# Patient Record
Sex: Female | Born: 1935 | Race: White | Hispanic: No | Smoking: Former smoker
Health system: Southern US, Community
[De-identification: ages and names within clinical notes are randomized; demographics above are authoritative.]

## PROBLEM LIST (undated history)

## (undated) DIAGNOSIS — K219 Gastro-esophageal reflux disease without esophagitis: Secondary | ICD-10-CM

## (undated) DIAGNOSIS — Z87440 Personal history of urinary (tract) infections: Secondary | ICD-10-CM

## (undated) DIAGNOSIS — M1990 Unspecified osteoarthritis, unspecified site: Secondary | ICD-10-CM

## (undated) DIAGNOSIS — H409 Unspecified glaucoma: Secondary | ICD-10-CM

## (undated) DIAGNOSIS — E785 Hyperlipidemia, unspecified: Secondary | ICD-10-CM

## (undated) DIAGNOSIS — I10 Essential (primary) hypertension: Secondary | ICD-10-CM

## (undated) DIAGNOSIS — E039 Hypothyroidism, unspecified: Secondary | ICD-10-CM

## (undated) HISTORY — PX: THYROIDECTOMY: SHX17

## (undated) HISTORY — PX: OTHER SURGICAL HISTORY: SHX169

## (undated) HISTORY — PX: BREAST SURGERY: SHX581

## (undated) HISTORY — PX: ABDOMINAL HYSTERECTOMY: SHX81

## (undated) HISTORY — PX: BREAST LUMPECTOMY: SHX2

---

## 2001-10-11 ENCOUNTER — Encounter: Payer: Self-pay | Admitting: Family Medicine

## 2001-10-11 ENCOUNTER — Ambulatory Visit (HOSPITAL_COMMUNITY): Admission: RE | Admit: 2001-10-11 | Discharge: 2001-10-11 | Payer: Self-pay | Admitting: Family Medicine

## 2002-08-31 ENCOUNTER — Other Ambulatory Visit: Admission: RE | Admit: 2002-08-31 | Discharge: 2002-08-31 | Payer: Self-pay | Admitting: Dermatology

## 2002-10-19 ENCOUNTER — Encounter: Payer: Self-pay | Admitting: Family Medicine

## 2002-10-19 ENCOUNTER — Ambulatory Visit (HOSPITAL_COMMUNITY): Admission: RE | Admit: 2002-10-19 | Discharge: 2002-10-19 | Payer: Self-pay | Admitting: Family Medicine

## 2003-02-06 ENCOUNTER — Encounter: Payer: Self-pay | Admitting: Family Medicine

## 2003-02-06 ENCOUNTER — Ambulatory Visit (HOSPITAL_COMMUNITY): Admission: RE | Admit: 2003-02-06 | Discharge: 2003-02-06 | Payer: Self-pay | Admitting: Family Medicine

## 2003-02-14 ENCOUNTER — Observation Stay (HOSPITAL_COMMUNITY): Admission: RE | Admit: 2003-02-14 | Discharge: 2003-02-16 | Payer: Self-pay | Admitting: Internal Medicine

## 2004-05-27 ENCOUNTER — Ambulatory Visit (HOSPITAL_COMMUNITY): Admission: RE | Admit: 2004-05-27 | Discharge: 2004-05-27 | Payer: Self-pay | Admitting: Family Medicine

## 2005-09-28 ENCOUNTER — Ambulatory Visit (HOSPITAL_COMMUNITY): Admission: RE | Admit: 2005-09-28 | Discharge: 2005-09-28 | Payer: Self-pay | Admitting: Family Medicine

## 2005-10-28 ENCOUNTER — Ambulatory Visit (HOSPITAL_COMMUNITY): Admission: RE | Admit: 2005-10-28 | Discharge: 2005-10-28 | Payer: Self-pay | Admitting: Family Medicine

## 2006-06-29 ENCOUNTER — Ambulatory Visit (HOSPITAL_COMMUNITY): Admission: RE | Admit: 2006-06-29 | Discharge: 2006-06-29 | Payer: Self-pay | Admitting: Family Medicine

## 2006-07-30 ENCOUNTER — Ambulatory Visit (HOSPITAL_COMMUNITY): Admission: RE | Admit: 2006-07-30 | Discharge: 2006-07-30 | Payer: Self-pay | Admitting: Internal Medicine

## 2006-07-30 ENCOUNTER — Ambulatory Visit: Payer: Self-pay | Admitting: Internal Medicine

## 2006-11-23 ENCOUNTER — Ambulatory Visit (HOSPITAL_COMMUNITY): Admission: RE | Admit: 2006-11-23 | Discharge: 2006-11-23 | Payer: Self-pay | Admitting: Urology

## 2007-06-01 ENCOUNTER — Emergency Department (HOSPITAL_COMMUNITY): Admission: EM | Admit: 2007-06-01 | Discharge: 2007-06-02 | Payer: Self-pay | Admitting: Emergency Medicine

## 2008-01-19 ENCOUNTER — Ambulatory Visit (HOSPITAL_COMMUNITY): Admission: RE | Admit: 2008-01-19 | Discharge: 2008-01-19 | Payer: Self-pay | Admitting: Family Medicine

## 2008-04-19 ENCOUNTER — Ambulatory Visit (HOSPITAL_COMMUNITY): Admission: RE | Admit: 2008-04-19 | Discharge: 2008-04-19 | Payer: Self-pay | Admitting: Ophthalmology

## 2009-01-28 ENCOUNTER — Ambulatory Visit (HOSPITAL_COMMUNITY): Admission: RE | Admit: 2009-01-28 | Discharge: 2009-01-28 | Payer: Self-pay | Admitting: Family Medicine

## 2010-01-30 ENCOUNTER — Ambulatory Visit (HOSPITAL_COMMUNITY): Admission: RE | Admit: 2010-01-30 | Discharge: 2010-01-30 | Payer: Self-pay | Admitting: Family Medicine

## 2010-05-26 ENCOUNTER — Ambulatory Visit (HOSPITAL_COMMUNITY): Admission: RE | Admit: 2010-05-26 | Discharge: 2010-05-26 | Payer: Self-pay | Admitting: Family Medicine

## 2010-12-15 ENCOUNTER — Ambulatory Visit (HOSPITAL_COMMUNITY)
Admission: RE | Admit: 2010-12-15 | Discharge: 2010-12-15 | Payer: Self-pay | Source: Home / Self Care | Attending: Family Medicine | Admitting: Family Medicine

## 2011-01-03 ENCOUNTER — Encounter: Payer: Self-pay | Admitting: Family Medicine

## 2011-01-12 ENCOUNTER — Other Ambulatory Visit (HOSPITAL_COMMUNITY): Payer: Self-pay | Admitting: Family Medicine

## 2011-01-12 DIAGNOSIS — Z139 Encounter for screening, unspecified: Secondary | ICD-10-CM

## 2011-01-20 ENCOUNTER — Other Ambulatory Visit: Payer: Self-pay | Admitting: Ophthalmology

## 2011-01-20 ENCOUNTER — Encounter (HOSPITAL_COMMUNITY): Payer: Medicare Other | Attending: Ophthalmology

## 2011-01-20 DIAGNOSIS — Z01810 Encounter for preprocedural cardiovascular examination: Secondary | ICD-10-CM | POA: Insufficient documentation

## 2011-01-20 DIAGNOSIS — Z01812 Encounter for preprocedural laboratory examination: Secondary | ICD-10-CM | POA: Insufficient documentation

## 2011-01-20 LAB — BASIC METABOLIC PANEL
CO2: 28 mEq/L (ref 19–32)
Calcium: 9.5 mg/dL (ref 8.4–10.5)
Chloride: 103 mEq/L (ref 96–112)
Creatinine, Ser: 0.94 mg/dL (ref 0.4–1.2)
Glucose, Bld: 82 mg/dL (ref 70–99)

## 2011-01-26 ENCOUNTER — Ambulatory Visit (HOSPITAL_COMMUNITY)
Admission: RE | Admit: 2011-01-26 | Discharge: 2011-01-26 | Disposition: A | Discharge status: Home / Self Care | Payer: Medicare Other | Source: Ambulatory Visit | Attending: Ophthalmology | Admitting: Ophthalmology

## 2011-01-26 DIAGNOSIS — Z7982 Long term (current) use of aspirin: Secondary | ICD-10-CM | POA: Insufficient documentation

## 2011-01-26 DIAGNOSIS — J449 Chronic obstructive pulmonary disease, unspecified: Secondary | ICD-10-CM | POA: Insufficient documentation

## 2011-01-26 DIAGNOSIS — H2510 Age-related nuclear cataract, unspecified eye: Secondary | ICD-10-CM | POA: Insufficient documentation

## 2011-01-26 DIAGNOSIS — J4489 Other specified chronic obstructive pulmonary disease: Secondary | ICD-10-CM | POA: Insufficient documentation

## 2011-02-02 ENCOUNTER — Ambulatory Visit (HOSPITAL_COMMUNITY): Payer: Medicare Other

## 2011-02-06 ENCOUNTER — Ambulatory Visit (HOSPITAL_COMMUNITY)
Admission: RE | Admit: 2011-02-06 | Discharge: 2011-02-06 | Disposition: A | Discharge status: Home / Self Care | Payer: Medicare Other | Source: Ambulatory Visit | Attending: Family Medicine | Admitting: Family Medicine

## 2011-02-06 DIAGNOSIS — Z139 Encounter for screening, unspecified: Secondary | ICD-10-CM

## 2011-02-06 DIAGNOSIS — Z1231 Encounter for screening mammogram for malignant neoplasm of breast: Secondary | ICD-10-CM | POA: Insufficient documentation

## 2011-05-01 NOTE — Op Note (Signed)
NAMEANDRIA, HEAD             ACCOUNT NO.:  [id number]   MEDICAL RECORD NO.:  [id number]          PATIENT TYPE:  AMB   LOCATION:  DAY                           FACILITY:  [facility]   PHYSICIAN:  Lionel December, M.D.    DATE OF BIRTH:  Nov 17, 1929   DATE OF PROCEDURE:  07/30/2006  DATE OF DISCHARGE:                                 OPERATIVE REPORT   PROCEDURE:  Colonoscopy.   INDICATIONS FOR PROCEDURE:  Rwanda is a 77 year old Caucasian female who  is undergoing screening colonoscopy.  Family history is negative for CRC in  first-degree relatives, but she has two nephews who were treated for colon  carcinoma in their 56s.  This is her first examination.  The procedure risks  were reviewed with the patient, and informed consent was obtained.   PREOPERATIVE MEDICATIONS:  Conscious sedation with Demerol 50 mg IV, Versed  5 mg IV.   FINDINGS:  The procedure was performed in the endoscopy suite.  The  patient's vital signs and O2 saturations were monitored during the procedure  and remained stable.  The patient was placed in the left lateral recumbent  position and rectal examination performed.  No abnormality noted on external  or digital exam.  The Olympus videoscope was placed into the rectum and  advanced into the region of the sigmoid colon and beyond.  She had multiple  diverticula at the sigmoid colon.  Some formed stool was flushed out of the  way.  The scope was passed into the transverse colon which was somewhat  redundant, but using abdominal pressure and different positions, the scope  was passed into the cecum which was identified by the ileocecal valve and  appendiceal stump.  Pictures were taken for the record.  As the scope was  withdrawn, the colonic mucosa was examined for the second time, and there  were no polyps or masses noted.  The rectal mucosa similarly was normal.  The scope was retroflexed to examine the anorectal junction, and small  hemorrhoids were noted  below the dentate line along with anal papilla.  The  endoscope was straightened and withdrawn.  The patient tolerated the  procedure well.   FINAL DIAGNOSES:  1. Pancolonic diverticulosis.  Most of the diverticula are limited to the      sigmoid colon.  2. External hemorrhoids with a single anal papilla.   RECOMMENDATIONS:  1. Yearly Hemoccults.  2. She may consider her next screening exam in 10 years from now.  3. High fiber diet.      Lionel December, M.D.  Electronically Signed     NR/MEDQ  D:  07/30/2006  T:  07/30/2006  Job:  [id number]   cc:   Corrie Mckusick, M.D.  Fax: [phone]

## 2011-05-01 NOTE — H&P (Signed)
NAME:  Kelsey Ruiz, Kelsey Ruiz                       ACCOUNT NO.:  [id number]   MEDICAL RECORD NO.:  [id number]                   PATIENT TYPE:  AMB   LOCATION:  DAY                                  FACILITY:  [facility]   PHYSICIAN:  Bernerd Limbo. Leona Carry, M.D.             DATE OF BIRTH:  [birth year]   DATE OF ADMISSION:  02/13/2003  DATE OF DISCHARGE:                                HISTORY & PHYSICAL   REASON FOR ADMISSION:  This 77 year old white female is admitted to this  hospital with a mass in the right side of the neck.   HISTORY OF PRESENT ILLNESS:  The patient has been in good health.  She has  been followed by Dr. Phillips Odor.  Recently he noticed a mass in the right side  of the neck, and he ordered an ultrasound.  The ultrasound revealed a right  lobe mass of the thyroid.  It was the opinion of the radiologist this could  be a degenerated adenoma versus cancer. The patient is admitted now for  removal of this mass of the right side of the neck.  She has had no  symptoms, no difficulty in swallowing.  Recent TSH was normal.  She is  euthyroid.  She has been in good health.   PAST MEDICAL HISTORY:  1. In 1996, she had a vaginal hysterectomy and anterior repair.  2. 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   CURRENT MEDICATIONS:  Aspirin and Lipitor.   ALLERGIES:  No history of any food or drug allergy.   PHYSICAL EXAMINATION:  GENERAL:  Healthy 77 year old white female in no  acute distress.  HEENT:  Negative.  NECK:  Supple.  There is a well-healed thyroidectomy scar on the right side  of the neck in the region of the right lobe of the thyroid.  There is a mass  that measures probably 4 cm x 3 cm.  There is no pain.  It does not appear  to be fixed.  There is no palpable adenopathy.  The left side is normal.  No  supraclavicular nodes are noted.  RESPIRATORY:  Clear to percussion and auscultation.  BREASTS:   Moderate size and symmetrical.  No abnormal masses.  Examination  of both axillae is normal.  ABDOMEN:  Soft.  No areas of muscle guarding or tenderness.  No mass or  hepatosplenomegaly.  Normal peristalsis.  MUSCULOSKELETAL:  Limbs and back negative.   ADMISSION DIAGNOSIS:  Recurrent mass of the right lobe of the thyroid,  adenoma versus cancer.   DISPOSITION:  The patient is admitted for a right thyroid lobectomy.  The  surgery, risks, complications, and possible consequences have been discussed  with the patient.  She agrees with the surgery.  She has been scheduled for  February 14, 2003.  Bernerd Limbo. Leona Carry, M.D.   NMD/MEDQ  D:  02/13/2003  T:  02/13/2003  Job:  [id number]

## 2011-05-01 NOTE — Procedures (Signed)
NAME:  AMARYA, KUEHL                       ACCOUNT NO.:  [id number]   MEDICAL RECORD NO.:  [id number]                   PATIENT TYPE:  OUT   LOCATION:  RAD                                  FACILITY:  [facility]   PHYSICIAN:  Willa Rough, M.D.                  DATE OF BIRTH:  [birth year]   DATE OF PROCEDURE:  DATE OF DISCHARGE:                                  ECHOCARDIOGRAM   MEDICAL NECESSITY NUMBERS:  780.2 and 435.9.   The patient has had a question of a TIA and question of syncope and this  study is done for further evaluation.   2-D ECHOCARDIOGRAM:  Aorta:  33 mm.  Aortic valve:  Normal.  Left atrium:  36 mm.  Mitral valve:  Normal.  Left ventricle:  End-diastolic dimension 38  mm and systolic dimension 26 mm.  Wall thickness is 12 mm, compatible with  left ventricular hypertrophy.  Overall wall motion is good.  The ejection  fraction is in the 60% range.  I cannot rule out the possibility of a focal  wall motion abnormality effecting the posterior wall.  Right ventricle:  Normal.  Tricuspid valve:  Normal.  Pulmonic valve:  Not well seen.  Pericardial effusion:  There is no significant effusion seen.   Doppler analysis reveals no significant abnormalities.   IMPRESSION:  1. Good left ventricular function.  2. Question of mild focal wall motion abnormality effecting the posterior     wall.  3. Mild left ventricular hypertrophy.  4. There is no obvious cardiac source of embolus seen.      ___________________________________________                                            Willa Rough, M.D.   JK/MEDQ  D:  05/28/2004  T:  05/28/2004  Job:  [id number]   cc:   Patrica Duel, M.D.  [address]  [place]  [place] [address]  Fax: [phone]   Corrie Mckusick, M.D.  [address]  [place]  [place] [address]  Fax: [phone]

## 2011-05-01 NOTE — Procedures (Signed)
   NAME:  Kelsey Ruiz, Kelsey Ruiz                       ACCOUNT NO.:  [id number]   MEDICAL RECORD NO.:  [id number]                   PATIENT TYPE:  AMB   LOCATION:                                       FACILITY:   PHYSICIAN:  Vida Roller, M.D.                DATE OF BIRTH:  1929-01-08   DATE OF PROCEDURE:  02/12/2003  DATE OF DISCHARGE:                                EKG INTERPRETATION   Electrocardiogram was obtained on 02/12/03.   RESULTS:  Normal sinus rhythm at a rate of 75 with normal intervals and  normal axes.  There is no evidence of myocardial infarction.  There is no  evidence of an old myocardial infarction and there is no evidence of  ischemia.  Overall, electrocardiogram is normal.                                               Vida Roller, M.D.    JH/MEDQ  D:  02/12/2003  T:  02/12/2003  Job:  [id number]

## 2011-05-01 NOTE — Op Note (Signed)
NAME:  Kelsey Ruiz, Kelsey Ruiz                       ACCOUNT NO.:  [id number]   MEDICAL RECORD NO.:  [id number]                   PATIENT TYPE:  AMB   LOCATION:  DAY                                  FACILITY:  [facility]   PHYSICIAN:  Bernerd Limbo. Leona Carry, M.D.             DATE OF BIRTH:  [birth year]   DATE OF PROCEDURE:  02/14/2003  DATE OF DISCHARGE:                                 OPERATIVE REPORT   PREOPERATIVE DIAGNOSIS:  Mass of the right lobe of the thyroid.   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   PROCEDURE:  Right thyroid lobectomy.   COMPLICATIONS:  None.   SURGEON:  Bernerd Limbo. Destefano, M.D.   DESCRIPTION OF PROCEDURE:  Under adequate general anesthesia, patient  prepped and draped in the usual manner, and a low collar-type incision was  made through the previous scar.  This was carried through the subcutaneous  tissues, and flaps were developed above the thyroid cartilage and below the  sternal notch.  There was a markedly enlarged right lobe of the thyroid.  There were marked adhesions in this region secondary to her prior subtotal  thyroidectomy on the right.  The gland was mobilized by sharp and blunt  dissection.  Bleeders were clipped with a Ligaclip.  The lower portion of  the gland was freed up first and then utilizing mostly blunt dissection, the  muscle layers on the right were freed from the gland itself.  There was a  marked amount of adhesions in this area.  The medial portion of the gland  was mobilized and most of the gland introduced in the wound.  The superior  pole vessels were doubly clipped and transected and then utilizing more or  less of a blunt technique pushing tissue inferiorly out of the way, we were  able to mobilize the entire gland and peel it off the lateral side of the  trachea.  The gland was removed in toto.  I identified the parathyroid  glands, I think that probably the lower portion of the specimen did have  the  inferior parathyroid glands.  I definitely localized the recurrent laryngeal  nerve.  I was concerned that it may be in some of the scar tissue.  Bleeding  during the course of the procedure was minimal.  The specimen was submitted  for frozen section, and a diagnosis of cystic adenomatous goiter on the  right was made.  After treating all bleeders under control, a small piece of  Gelfoam wrapped in Surgicel was packed into the right thyroid fossa.  The  strap muscles were reapproximated with interrupted 2-0 silk.  Prior to doing  this, a small Hemovac drain was placed over the top of the previously-placed  Gelfoam and Surgicel and brought out through a stab wound on the left side  of the neck.  After closure of the strap muscles, the subcutaneous tissue  was closed with continuous 3-0 Vicryl and the skin was then closed with a  subcuticular suture of 4-0 Vicryl, Steri-Strips, 4 x 4 sponges, and a towel  were then applied.  Blood loss was probably 10 or 15 mL.  The patient  tolerated the procedure nicely and left in good condition.                                               Bernerd Limbo. Leona Carry, M.D.    NMD/MEDQ  D:  02/14/2003  T:  02/14/2003  Job:  [id number]

## 2011-09-08 LAB — BASIC METABOLIC PANEL
CO2: 29
Calcium: 8.9
Sodium: 139

## 2011-09-30 LAB — URINALYSIS, ROUTINE W REFLEX MICROSCOPIC
Bilirubin Urine: NEGATIVE
Glucose, UA: NEGATIVE
Leukocytes, UA: NEGATIVE
Specific Gravity, Urine: 1.02
Urobilinogen, UA: 0.2

## 2011-09-30 LAB — COMPREHENSIVE METABOLIC PANEL
Albumin: 4
BUN: 13
Chloride: 104
Creatinine, Ser: 0.68
GFR calc Af Amer: >60
GFR calc non Af Amer: >60
Glucose, Bld: 128 — ABNORMAL HIGH
Potassium: 3.9
Total Bilirubin: 0.7

## 2011-09-30 LAB — DIFFERENTIAL
Basophils Absolute: 0.2 — ABNORMAL HIGH
Eosinophils Absolute: 0.1
Eosinophils Relative: 1
Lymphocytes Relative: 8 — ABNORMAL LOW
Lymphs Abs: 1.4
Monocytes Absolute: 1.5 — ABNORMAL HIGH

## 2011-09-30 LAB — CBC
HCT: 36.1
MCV: 86.5
RBC: 4.18
WBC: 17.8 — ABNORMAL HIGH

## 2011-09-30 LAB — URINE MICROSCOPIC-ADD ON

## 2011-09-30 LAB — CULTURE, BLOOD (ROUTINE X 2): Report Status: 6242008

## 2011-11-28 ENCOUNTER — Other Ambulatory Visit (HOSPITAL_COMMUNITY): Payer: Self-pay | Admitting: Family Medicine

## 2011-11-28 ENCOUNTER — Ambulatory Visit (HOSPITAL_COMMUNITY)
Admission: RE | Admit: 2011-11-28 | Discharge: 2011-11-28 | Disposition: A | Discharge status: Home / Self Care | Payer: Medicare Other | Source: Ambulatory Visit | Attending: Family Medicine | Admitting: Family Medicine

## 2011-11-28 DIAGNOSIS — M545 Low back pain, unspecified: Secondary | ICD-10-CM | POA: Insufficient documentation

## 2011-11-28 DIAGNOSIS — M549 Dorsalgia, unspecified: Secondary | ICD-10-CM

## 2011-11-28 DIAGNOSIS — M25559 Pain in unspecified hip: Secondary | ICD-10-CM

## 2011-11-30 ENCOUNTER — Other Ambulatory Visit (HOSPITAL_COMMUNITY): Payer: Self-pay | Admitting: Family Medicine

## 2011-11-30 DIAGNOSIS — Z139 Encounter for screening, unspecified: Secondary | ICD-10-CM

## 2011-11-30 DIAGNOSIS — M545 Low back pain: Secondary | ICD-10-CM

## 2011-12-03 ENCOUNTER — Ambulatory Visit (HOSPITAL_COMMUNITY)
Admission: RE | Admit: 2011-12-03 | Discharge: 2011-12-03 | Disposition: A | Discharge status: Home / Self Care | Payer: Medicare Other | Source: Ambulatory Visit | Attending: Family Medicine | Admitting: Family Medicine

## 2011-12-03 DIAGNOSIS — Z780 Asymptomatic menopausal state: Secondary | ICD-10-CM | POA: Insufficient documentation

## 2011-12-03 DIAGNOSIS — M899 Disorder of bone, unspecified: Secondary | ICD-10-CM | POA: Insufficient documentation

## 2011-12-03 DIAGNOSIS — Z139 Encounter for screening, unspecified: Secondary | ICD-10-CM

## 2011-12-03 DIAGNOSIS — M545 Low back pain: Secondary | ICD-10-CM

## 2012-01-25 ENCOUNTER — Other Ambulatory Visit (HOSPITAL_COMMUNITY): Payer: Self-pay | Admitting: Family Medicine

## 2012-01-25 DIAGNOSIS — Z139 Encounter for screening, unspecified: Secondary | ICD-10-CM

## 2012-02-08 ENCOUNTER — Ambulatory Visit (HOSPITAL_COMMUNITY)
Admission: RE | Admit: 2012-02-08 | Discharge: 2012-02-08 | Disposition: A | Discharge status: Home / Self Care | Payer: Medicare Other | Source: Ambulatory Visit | Attending: Family Medicine | Admitting: Family Medicine

## 2012-02-08 DIAGNOSIS — Z1231 Encounter for screening mammogram for malignant neoplasm of breast: Secondary | ICD-10-CM | POA: Insufficient documentation

## 2012-02-08 DIAGNOSIS — Z139 Encounter for screening, unspecified: Secondary | ICD-10-CM

## 2012-06-13 ENCOUNTER — Other Ambulatory Visit (HOSPITAL_COMMUNITY): Payer: Self-pay | Admitting: Family Medicine

## 2012-06-13 ENCOUNTER — Ambulatory Visit (HOSPITAL_COMMUNITY)
Admission: RE | Admit: 2012-06-13 | Discharge: 2012-06-13 | Disposition: A | Discharge status: Home / Self Care | Payer: Medicare Other | Source: Ambulatory Visit | Attending: Family Medicine | Admitting: Family Medicine

## 2012-06-13 DIAGNOSIS — M25569 Pain in unspecified knee: Secondary | ICD-10-CM

## 2012-06-13 DIAGNOSIS — M25469 Effusion, unspecified knee: Secondary | ICD-10-CM | POA: Insufficient documentation

## 2013-01-25 ENCOUNTER — Other Ambulatory Visit (HOSPITAL_COMMUNITY): Payer: Self-pay | Admitting: Family Medicine

## 2013-01-25 DIAGNOSIS — Z139 Encounter for screening, unspecified: Secondary | ICD-10-CM

## 2013-02-09 ENCOUNTER — Ambulatory Visit (HOSPITAL_COMMUNITY)
Admission: RE | Admit: 2013-02-09 | Discharge: 2013-02-09 | Disposition: A | Discharge status: Home / Self Care | Payer: Medicare Other | Source: Ambulatory Visit | Attending: Family Medicine | Admitting: Family Medicine

## 2013-02-09 DIAGNOSIS — Z1231 Encounter for screening mammogram for malignant neoplasm of breast: Secondary | ICD-10-CM | POA: Insufficient documentation

## 2013-02-09 DIAGNOSIS — Z139 Encounter for screening, unspecified: Secondary | ICD-10-CM

## 2013-04-11 ENCOUNTER — Other Ambulatory Visit (HOSPITAL_COMMUNITY): Payer: Self-pay | Admitting: Family Medicine

## 2013-04-11 ENCOUNTER — Ambulatory Visit (HOSPITAL_COMMUNITY)
Admission: RE | Admit: 2013-04-11 | Discharge: 2013-04-11 | Disposition: A | Discharge status: Home / Self Care | Payer: Medicare Other | Source: Ambulatory Visit | Attending: Family Medicine | Admitting: Family Medicine

## 2013-04-11 DIAGNOSIS — R51 Headache: Secondary | ICD-10-CM

## 2013-04-11 DIAGNOSIS — H532 Diplopia: Secondary | ICD-10-CM | POA: Insufficient documentation

## 2013-04-11 DIAGNOSIS — Z139 Encounter for screening, unspecified: Secondary | ICD-10-CM

## 2013-04-13 ENCOUNTER — Ambulatory Visit (HOSPITAL_COMMUNITY): Payer: Medicare Other

## 2013-04-17 ENCOUNTER — Ambulatory Visit (HOSPITAL_COMMUNITY): Admission: RE | Admit: 2013-04-17 | Payer: Medicare Other | Source: Ambulatory Visit

## 2013-04-19 ENCOUNTER — Ambulatory Visit (HOSPITAL_COMMUNITY): Payer: Medicare Other

## 2014-02-12 ENCOUNTER — Ambulatory Visit (HOSPITAL_COMMUNITY)
Admission: RE | Admit: 2014-02-12 | Discharge: 2014-02-12 | Disposition: A | Discharge status: Home / Self Care | Payer: Medicare Other | Source: Ambulatory Visit | Attending: Family Medicine | Admitting: Family Medicine

## 2014-02-12 DIAGNOSIS — Z139 Encounter for screening, unspecified: Secondary | ICD-10-CM

## 2014-02-12 DIAGNOSIS — Z1231 Encounter for screening mammogram for malignant neoplasm of breast: Secondary | ICD-10-CM | POA: Insufficient documentation

## 2014-02-14 ENCOUNTER — Other Ambulatory Visit: Payer: Self-pay | Admitting: Family Medicine

## 2014-02-14 DIAGNOSIS — R928 Other abnormal and inconclusive findings on diagnostic imaging of breast: Secondary | ICD-10-CM

## 2014-02-28 ENCOUNTER — Ambulatory Visit (HOSPITAL_COMMUNITY)
Admission: RE | Admit: 2014-02-28 | Discharge: 2014-02-28 | Disposition: A | Discharge status: Home / Self Care | Payer: Medicare Other | Source: Ambulatory Visit | Attending: Family Medicine | Admitting: Family Medicine

## 2014-02-28 ENCOUNTER — Other Ambulatory Visit: Payer: Self-pay | Admitting: Family Medicine

## 2014-02-28 DIAGNOSIS — R928 Other abnormal and inconclusive findings on diagnostic imaging of breast: Secondary | ICD-10-CM

## 2014-02-28 DIAGNOSIS — N63 Unspecified lump in unspecified breast: Secondary | ICD-10-CM | POA: Insufficient documentation

## 2014-03-06 ENCOUNTER — Other Ambulatory Visit (HOSPITAL_COMMUNITY): Payer: Self-pay | Admitting: Family Medicine

## 2014-03-06 DIAGNOSIS — R928 Other abnormal and inconclusive findings on diagnostic imaging of breast: Secondary | ICD-10-CM

## 2014-03-07 ENCOUNTER — Other Ambulatory Visit (HOSPITAL_COMMUNITY): Payer: Self-pay | Admitting: Family Medicine

## 2014-03-07 ENCOUNTER — Encounter (HOSPITAL_COMMUNITY): Payer: Self-pay

## 2014-03-07 ENCOUNTER — Ambulatory Visit (HOSPITAL_COMMUNITY)
Admission: RE | Admit: 2014-03-07 | Discharge: 2014-03-07 | Disposition: A | Discharge status: Home / Self Care | Payer: Medicare Other | Source: Ambulatory Visit | Attending: Family Medicine | Admitting: Family Medicine

## 2014-03-07 DIAGNOSIS — R928 Other abnormal and inconclusive findings on diagnostic imaging of breast: Secondary | ICD-10-CM

## 2014-03-07 DIAGNOSIS — R229 Localized swelling, mass and lump, unspecified: Principal | ICD-10-CM

## 2014-03-07 DIAGNOSIS — IMO0002 Reserved for concepts with insufficient information to code with codable children: Secondary | ICD-10-CM

## 2014-03-07 DIAGNOSIS — N63 Unspecified lump in unspecified breast: Secondary | ICD-10-CM | POA: Insufficient documentation

## 2014-03-07 DIAGNOSIS — N649 Disorder of breast, unspecified: Secondary | ICD-10-CM | POA: Insufficient documentation

## 2014-03-07 HISTORY — DX: Essential (primary) hypertension: I10

## 2014-03-07 MED ORDER — LIDOCAINE HCL (PF) 2 % IJ SOLN
INTRAMUSCULAR | Status: AC
  Administered 2014-03-07: 10 mL via INTRADERMAL
  Filled 2014-03-07: qty 10

## 2014-03-07 MED ORDER — LIDOCAINE HCL (PF) 2 % IJ SOLN
10.0000 mL | Freq: Once | INTRAMUSCULAR | Status: AC
  Administered 2014-03-07: 10 mL via INTRADERMAL

## 2014-03-07 NOTE — Progress Notes (Signed)
Pt tolerating procedure well.

## 2014-03-07 NOTE — Progress Notes (Signed)
Pt states she took ASA this morning, MD aware, advised about additional bruising after procedure.

## 2014-03-07 NOTE — Discharge Instructions (Addendum)
Breast Biopsy °A breast biopsy is a procedure where a sample of breast tissue is removed from your breast. The tissue is examined under a microscope to see if cancerous cells are present. A breast biopsy is done when there is: °· Any undiagnosed breast mass (tumor). °· Nipple abnormalities, dimpling, crusting, or ulcerations. °· Abnormal discharge from the nipple, especially blood. °· Redness, swelling, and pain of the breast. °· Calcium deposits (calcifications) or abnormalities seen on a mammogram, ultrasound result, or results of magnetic resonance imaging (MRI). °· Suspicious changes in the breast seen on your mammogram. °If the tumor is found to be cancerous (malignant), a breast biopsy can help to determine what the best treatment is for you. There are many different types of breast biopsies. Talk to your caregiver about your options and which type is best for you. °LET YOUR CAREGIVER KNOW ABOUT: °· Allergies to food or medicine. °· Medicines taken, including vitamins, herbs, eyedrops, over-the-counter medicines, and creams. °· Use of steroids (by mouth or creams). °· Previous problems with anesthetics or numbing medicines. °· History of bleeding problems or blood clots. °· Previous surgery. °· Other health problems, including diabetes and kidney problems. °· Any recent colds or infections. °· Possibility of pregnancy, if this applies. °RISKS AND COMPLICATIONS  °· Bleeding. °· Infection. °· Allergy to medicines. °· Bruising and swelling of the breast. °· Alteration in the shape of the breast. °· Not finding the lump or abnormality. °· Needing more surgery. °BEFORE THE PROCEDURE °· Arrange for someone to drive you home after the procedure. °· Do not smoke for 2 weeks before the procedure. Stop smoking, if you smoke. °· Do not drink alcohol for 24 hours before procedure. °· Wear a good support bra to the procedure. °PROCEDURE  °You may be given a medicine to numb the breast area (local anesthesia) or a medicine  to make you sleep (general anesthesia) during the procedure. The following are the different types of biopsies that can be performed.  °· Fine-needle aspiration A thin needle is attached to a syringe and inserted into the breast lump. Fluid and cells are removed and then looked at under a microscope. If the breast lump cannot be felt, an ultrasound may be used to help locate the lump and place the needle in the correct area.   °· Core needle biopsy A wide, hollow needle (core needle) is inserted into the breast lump 3 6 times to get tissue samples or cores. The samples are removed. The needle is usually placed in the correct area by using an ultrasound or X-ray.   °· Stereotactic biopsy X-ray equipment and a computer are used to analyze X-ray pictures of the breast lump. The computer then finds exactly where the core needle needs to be inserted. Tissue samples are removed.   °· Vacuum-assisted biopsy A small incision (less than ¼ inch) is made in your breast. A biopsy device that includes a hollow needle and vacuum is passed through the incision and into the breast tissue. The vacuum gently draws abnormal breast tissue into the needle to remove it. This type of biopsy removes a larger tissue sample than a regular core needle biopsy. No stitches are needed, and there is usually little scarring. °· Ultrasound-guided core needle biopsy A high frequency ultrasound helps guide the core needle to the area of the mass or abnormality. An incision is made to insert the needle. Tissue samples are removed. °· Open biopsy A larger incision is made in the breast. Your caregiver will attempt   to remove the whole breast lump or as much as possible. °AFTER THE PROCEDURE °· You will be taken to the recovery area. If you are doing well and have no problems, you will be allowed to go home. °· You may notice bruising on your breast. This is normal. °· Your caregiver may apply a pressure dressing on your breast for 24 48 hours. A  pressure dressing is a bandage that is wrapped tightly around the chest to stop fluid from collecting underneath tissues. °Document Released: 11/30/2005 Document Revised: 03/27/2013 Document Reviewed: 12/31/2011 °[employer]® Patient Information ©2014 [employer]. °Breast Biopsy °Care After °These instructions give you information on caring for yourself after your procedure. Your doctor may also give you more specific instructions. Call your doctor if you have any problems or questions after your procedure. °HOME CARE °· Only take medicine as told by your doctor. °· Do not take aspirin. °· Keep your sutures (stitches) dry when bathing. °· Protect the biopsy area. Do not let the area get bumped. °· Avoid activities that could pull the biopsy site open until your doctor approves. This includes: °· Stretching. °· Reaching. °· Exercise. °· Sports. °· Lifting more than 3lb. °· Continue your normal diet. °· Wear a good support bra for as long as told by your doctor. °· Change any bandages (dressings) as told by your doctor. °· Do not drink alcohol while taking pain medicine. °· Keep all doctor visits as told. Ask when your test results will be ready. Make sure you get your test results. °GET HELP RIGHT AWAY IF:  °· You have a fever. °· You have more bleeding (more than a small spot) from the biopsy site. °· You have trouble breathing. °· You have yellowish-white fluid (pus) coming from the biopsy site. °· You have redness, puffiness (swelling), or more pain in the biopsy site. °· You have a bad smell coming from the biopsy site. °· Your biopsy site opens after sutures, staples, or sticky strips have been removed. °· You have a rash. °· You need stronger medicine. °MAKE SURE YOU: °· Understand these instructions. °· Will watch your condition. °· Will get help right away if you are not doing well or get worse. °Document Released: 09/26/2009 Document Revised: 02/22/2012 Document Reviewed: 01/10/2012 °[employer]® Patient  Information ©2014 [employer]. ° °

## 2014-03-07 NOTE — Progress Notes (Signed)
Procedure completed, pt tolerated well, samples collected by MD and Ultrasound technician and sent to lab.

## 2014-03-29 ENCOUNTER — Other Ambulatory Visit (HOSPITAL_COMMUNITY): Payer: Self-pay | Admitting: General Surgery

## 2014-03-29 DIAGNOSIS — D493 Neoplasm of unspecified behavior of breast: Secondary | ICD-10-CM

## 2014-03-29 NOTE — H&P (Signed)
  NTS SOAP Note  Vital Signs:  Vitals as of: 2/59/5638: Systolic 756: Diastolic 97: Heart Rate 81: Temp 54F: Height 55ft 2.5in: Weight 166Lbs 0 Ounces: BMI 29.88  BMI : 29.88 kg/m2  Subjective: This 65 Years 41 Months old Female presents for of an abnormal right mammogram.  Suspicious area found on routine mammography.  Biopsy showed atypical cells.  Referred for formal excision.  No family h/o breast cancer.  No nipple discharge noted.  No palpable lump.  Review of Symptoms:  Constitutional:unremarkable   Head:unremarkable    Eyes:unremarkable   Nose/Mouth/Throat:unremarkable Cardiovascular:  unremarkable   Respiratory:unremarkable   Gastrointestin    heartburn Genitourinary:unremarkable       joint pain Skin:unremarkable   as above Hematolgic/Lymphatic:unremarkable       hay fever   Past Medical History:    Reviewed  Past Medical History  Surgical History: thyroidectomy Medical Problems: high cholesterol, chronic back pain Allergies: nkda Medications: lipitor, levothyroxine, asa, fosamax, hydrocodone   Social History:Reviewed  Social History  Preferred Language: English Race:  White Ethnicity: Not Hispanic / Latino Age: 80 Years 7 Months Marital Status:  M Alcohol: no Recreational drug(s): no   Smoking Status: Never smoker reviewed on 03/29/2014 Functional Status reviewed on 03/29/2014 ------------------------------------------------ Bathing: Normal Cooking: Normal Dressing: Normal Driving: Normal Eating: Normal Managing Meds: Normal Oral Care: Normal Shopping: Normal Toileting: Normal Transferring: Normal Walking: Normal Cognitive Status reviewed on 03/29/2014 ------------------------------------------------ Attention: Normal Decision Making: Normal Language: Normal Memory: Normal Motor: Normal Perception: Normal Problem Solving: Normal Visual and Spatial: Normal   Family History:   [place]as History Mother, Deceased; Healthy;  Father, Deceased; Healthy;     Objective Information: General:  Well appearing, well nourished in no distress. Neck:  Supple without lymphadenopathy.  Heart:  RRR, no murmur or gallop.  Normal S1, S2.  No S3, S4.  Lungs:    CTA bilaterally, no wheezes, rhonchi, rales.  Breathing unlabored. Breasts:  unremarkable  No dominant mass, nipple discharge, dimpling.  Axillas negative for palpable nodes.    Assessment:Neoplasm, right breast  Diagnoses: 433.2 Neoplasm of uncertain behavior of breast (Neoplasm of uncertain behavior of right breast)  Procedures: 95188 - OFFICE OUTPATIENT NEW 30 MINUTES    Plan:  Scheduled for right breast biopsy after needle localization on 04/11/14.   Patient Education:Alternative treatments to surgery were discussed with patient (and family).  Risks and benefits  of procedure were fully explained to the patient (and family) who gave informed consent. Patient/family questions were addressed.  will stop ASA.  Follow-up:Pending Surgery

## 2014-03-30 ENCOUNTER — Encounter (HOSPITAL_COMMUNITY): Payer: Self-pay | Admitting: Pharmacy Technician

## 2014-04-04 NOTE — Patient Instructions (Signed)
Kelsey Ruiz  04/04/2014   Your procedure is scheduled on:   04/11/2014  Report to [facility] at  24  AM.  Call this number if you have problems the morning of surgery: [phone]   Remember:   Do not eat food or drink liquids after midnight.   Take these medicines the morning of surgery with A SIP OF WATER: hydrocodone, levothyroxine, prilosec   Do not wear jewelry, make-up or nail polish.  Do not wear lotions, powders, or perfumes.   Do not shave 48 hours prior to surgery. Men may shave face and neck.  Do not bring valuables to the hospital.  [facility] is not responsible for any belongings or valuables.               Contacts, dentures or bridgework may not be worn into surgery.  Leave suitcase in the car. After surgery it may be brought to your room.  For patients admitted to the hospital, discharge time is determined by your treatment team.               Patients discharged the day of surgery will not be allowed to drive home.  Name and phone number of your driver: family  Special Instructions: Shower using CHG 2 nights before surgery and the night before surgery.  If you shower the day of surgery use CHG.  Use special wash - you have one bottle of CHG for all showers.  You should use approximately 1/3 of the bottle for each shower.   Please read over the following fact sheets that you were given: Pain Booklet, Coughing and Deep Breathing, Surgical Site Infection Prevention, Anesthesia Post-op Instructions and Care and Recovery After Surgery Breast Biopsy A breast biopsy is a procedure where a sample of breast tissue is removed from your breast. The tissue is examined under a microscope to see if cancerous cells are present. A breast biopsy is done when there is:  Any undiagnosed breast mass (tumor).  Nipple abnormalities, dimpling, crusting, or ulcerations.  Abnormal discharge from the nipple, especially blood.  Redness, swelling, and pain of the  breast.  Calcium deposits (calcifications) or abnormalities seen on a mammogram, ultrasound result, or results of magnetic resonance imaging (MRI).  Suspicious changes in the breast seen on your mammogram. If the tumor is found to be cancerous (malignant), a breast biopsy can help to determine what the best treatment is for you. There are many different types of breast biopsies. Talk to your caregiver about your options and which type is best for you. LET YOUR CAREGIVER KNOW ABOUT:  Allergies to food or medicine.  Medicines taken, including vitamins, herbs, eyedrops, over-the-counter medicines, and creams.  Use of steroids (by mouth or creams).  Previous problems with anesthetics or numbing medicines.  History of bleeding problems or blood clots.  Previous surgery.  Other health problems, including diabetes and kidney problems.  Any recent colds or infections.  Possibility of pregnancy, if this applies. RISKS AND COMPLICATIONS   Bleeding.  Infection.  Allergy to medicines.  Bruising and swelling of the breast.  Alteration in the shape of the breast.  Not finding the lump or abnormality.  Needing more surgery. BEFORE THE PROCEDURE  Arrange for someone to drive you home after the procedure.  Do not smoke for 2 weeks before the procedure. Stop smoking, if you smoke.  Do not drink alcohol for 24 hours before procedure.  Wear a good support bra to the  procedure. PROCEDURE  You may be given a medicine to numb the breast area (local anesthesia) or a medicine to make you sleep (general anesthesia) during the procedure. The following are the different types of biopsies that can be performed.   Fine-needle aspiration A thin needle is attached to a syringe and inserted into the breast lump. Fluid and cells are removed and then looked at under a microscope. If the breast lump cannot be felt, an ultrasound may be used to help locate the lump and place the needle in the correct  area.   Core needle biopsy A wide, hollow needle (core needle) is inserted into the breast lump 3 6 times to get tissue samples or cores. The samples are removed. The needle is usually placed in the correct area by using an ultrasound or X-ray.   Stereotactic biopsy X-ray equipment and a computer are used to analyze X-ray pictures of the breast lump. The computer then finds exactly where the core needle needs to be inserted. Tissue samples are removed.   Vacuum-assisted biopsy A small incision (less than  inch) is made in your breast. A biopsy device that includes a hollow needle and vacuum is passed through the incision and into the breast tissue. The vacuum gently draws abnormal breast tissue into the needle to remove it. This type of biopsy removes a larger tissue sample than a regular core needle biopsy. No stitches are needed, and there is usually little scarring.  Ultrasound-guided core needle biopsy A high frequency ultrasound helps guide the core needle to the area of the mass or abnormality. An incision is made to insert the needle. Tissue samples are removed.  Open biopsy A larger incision is made in the breast. Your caregiver will attempt to remove the whole breast lump or as much as possible. AFTER THE PROCEDURE  You will be taken to the recovery area. If you are doing well and have no problems, you will be allowed to go home.  You may notice bruising on your breast. This is normal.  Your caregiver may apply a pressure dressing on your breast for 24 48 hours. A pressure dressing is a bandage that is wrapped tightly around the chest to stop fluid from collecting underneath tissues. Document Released: 11/30/2005 Document Revised: 03/27/2013 Document Reviewed: 12/31/2011 [facility] Patient Information 2014 [place]. PATIENT INSTRUCTIONS POST-ANESTHESIA  IMMEDIATELY FOLLOWING SURGERY:  Do not drive or operate machinery for the first twenty four hours after surgery.  Do not  make any important decisions for twenty four hours after surgery or while taking narcotic pain medications or sedatives.  If you develop intractable nausea and vomiting or a severe headache please notify your doctor immediately.  FOLLOW-UP:  Please make an appointment with your surgeon as instructed. You do not need to follow up with anesthesia unless specifically instructed to do so.  WOUND CARE INSTRUCTIONS (if applicable):  Keep a dry clean dressing on the anesthesia/puncture wound site if there is drainage.  Once the wound has quit draining you may leave it open to air.  Generally you should leave the bandage intact for twenty four hours unless there is drainage.  If the epidural site drains for more than 36-48 hours please call the anesthesia department.  QUESTIONS?:  Please feel free to call your physician or the hospital operator if you have any questions, and they will be happy to assist you.

## 2014-04-05 ENCOUNTER — Ambulatory Visit (HOSPITAL_COMMUNITY)
Admission: RE | Admit: 2014-04-05 | Discharge: 2014-04-05 | Disposition: A | Discharge status: Home / Self Care | Payer: Medicare Other | Source: Ambulatory Visit | Attending: General Surgery | Admitting: General Surgery

## 2014-04-05 ENCOUNTER — Encounter (HOSPITAL_COMMUNITY)
Admission: RE | Admit: 2014-04-05 | Discharge: 2014-04-05 | Disposition: A | Discharge status: Home / Self Care | Payer: Medicare Other | Source: Ambulatory Visit | Attending: General Surgery | Admitting: General Surgery

## 2014-04-05 ENCOUNTER — Encounter (HOSPITAL_COMMUNITY): Payer: Self-pay

## 2014-04-05 ENCOUNTER — Other Ambulatory Visit: Payer: Self-pay

## 2014-04-05 DIAGNOSIS — Z01818 Encounter for other preprocedural examination: Secondary | ICD-10-CM | POA: Insufficient documentation

## 2014-04-05 DIAGNOSIS — Z01812 Encounter for preprocedural laboratory examination: Secondary | ICD-10-CM | POA: Insufficient documentation

## 2014-04-05 DIAGNOSIS — Z01810 Encounter for preprocedural cardiovascular examination: Secondary | ICD-10-CM | POA: Insufficient documentation

## 2014-04-05 HISTORY — DX: Hypothyroidism, unspecified: E03.9

## 2014-04-05 HISTORY — DX: Unspecified osteoarthritis, unspecified site: M19.90

## 2014-04-05 HISTORY — DX: Gastro-esophageal reflux disease without esophagitis: K21.9

## 2014-04-05 HISTORY — DX: Unspecified glaucoma: H40.9

## 2014-04-05 HISTORY — DX: Hyperlipidemia, unspecified: E78.5

## 2014-04-05 LAB — CBC WITH DIFFERENTIAL/PLATELET
Basophils Absolute: 0 10*3/uL (ref 0.0–0.1)
Basophils Relative: 0 % (ref 0–1)
Eosinophils Absolute: 0.1 10*3/uL (ref 0.0–0.7)
Eosinophils Relative: 1 % (ref 0–5)
HCT: 38.2 % (ref 36.0–46.0)
Hemoglobin: 12.2 g/dL (ref 12.0–15.0)
LYMPHS PCT: 24 % (ref 12–46)
Lymphs Abs: 2.1 10*3/uL (ref 0.7–4.0)
MCH: 29.3 pg (ref 26.0–34.0)
MCHC: 31.9 g/dL (ref 30.0–36.0)
MCV: 91.8 fL (ref 78.0–100.0)
MONO ABS: 0.9 10*3/uL (ref 0.1–1.0)
Monocytes Relative: 10 % (ref 3–12)
NEUTROS ABS: 5.5 10*3/uL (ref 1.7–7.7)
NEUTROS PCT: 64 % (ref 43–77)
PLATELETS: 272 10*3/uL (ref 150–400)
RBC: 4.16 MIL/uL (ref 3.87–5.11)
RDW: 13.5 % (ref 11.5–15.5)
WBC: 8.6 10*3/uL (ref 4.0–10.5)

## 2014-04-05 LAB — BASIC METABOLIC PANEL
BUN: 9 mg/dL (ref 6–23)
CALCIUM: 9.3 mg/dL (ref 8.4–10.5)
CHLORIDE: 103 meq/L (ref 96–112)
CO2: 29 mEq/L (ref 19–32)
Creatinine, Ser: 0.64 mg/dL (ref 0.50–1.10)
GFR, EST NON AFRICAN AMERICAN: 81 mL/min — AB (ref >90)
Glucose, Bld: 97 mg/dL (ref 70–99)
Potassium: 4.2 mEq/L (ref 3.7–5.3)
Sodium: 144 mEq/L (ref 137–147)

## 2014-04-05 NOTE — Pre-Procedure Instructions (Signed)
Given information to sign up for my chart at home.

## 2014-04-11 ENCOUNTER — Encounter (HOSPITAL_COMMUNITY): Payer: Self-pay | Admitting: *Deleted

## 2014-04-11 ENCOUNTER — Ambulatory Visit (HOSPITAL_COMMUNITY)
Admission: RE | Admit: 2014-04-11 | Discharge: 2014-04-11 | Disposition: A | Discharge status: Home / Self Care | Payer: Medicare Other | Source: Ambulatory Visit | Attending: General Surgery | Admitting: General Surgery

## 2014-04-11 ENCOUNTER — Encounter (HOSPITAL_COMMUNITY)
Admission: RE | Disposition: A | Discharge status: Home / Self Care | Payer: Self-pay | Source: Ambulatory Visit | Attending: General Surgery

## 2014-04-11 ENCOUNTER — Encounter (HOSPITAL_COMMUNITY): Payer: Medicare Other | Admitting: Anesthesiology

## 2014-04-11 ENCOUNTER — Ambulatory Visit (HOSPITAL_COMMUNITY): Payer: Medicare Other | Admitting: Anesthesiology

## 2014-04-11 ENCOUNTER — Ambulatory Visit (HOSPITAL_COMMUNITY): Payer: Medicare Other

## 2014-04-11 DIAGNOSIS — Z79899 Other long term (current) drug therapy: Secondary | ICD-10-CM | POA: Insufficient documentation

## 2014-04-11 DIAGNOSIS — I10 Essential (primary) hypertension: Secondary | ICD-10-CM | POA: Insufficient documentation

## 2014-04-11 DIAGNOSIS — D493 Neoplasm of unspecified behavior of breast: Secondary | ICD-10-CM

## 2014-04-11 DIAGNOSIS — IMO0002 Reserved for concepts with insufficient information to code with codable children: Secondary | ICD-10-CM

## 2014-04-11 DIAGNOSIS — R229 Localized swelling, mass and lump, unspecified: Secondary | ICD-10-CM

## 2014-04-11 DIAGNOSIS — Z87891 Personal history of nicotine dependence: Secondary | ICD-10-CM | POA: Insufficient documentation

## 2014-04-11 DIAGNOSIS — D0590 Unspecified type of carcinoma in situ of unspecified breast: Secondary | ICD-10-CM | POA: Insufficient documentation

## 2014-04-11 DIAGNOSIS — K219 Gastro-esophageal reflux disease without esophagitis: Secondary | ICD-10-CM | POA: Insufficient documentation

## 2014-04-11 HISTORY — PX: BREAST BIOPSY: SHX20

## 2014-04-11 SURGERY — BREAST BIOPSY WITH NEEDLE LOCALIZATION
Anesthesia: Monitor Anesthesia Care | Site: Breast | Laterality: Right

## 2014-04-11 MED ORDER — MIDAZOLAM HCL 2 MG/2ML IJ SOLN
INTRAMUSCULAR | Status: AC
  Filled 2014-04-11: qty 2

## 2014-04-11 MED ORDER — FENTANYL CITRATE 0.05 MG/ML IJ SOLN
INTRAMUSCULAR | Status: DC | PRN
Start: 2014-04-11 — End: 2014-04-11
  Administered 2014-04-11: 25 ug via INTRAVENOUS
  Administered 2014-04-11 (×6): 12.5 ug via INTRAVENOUS

## 2014-04-11 MED ORDER — LIDOCAINE HCL (PF) 1 % IJ SOLN
INTRAMUSCULAR | Status: AC
  Filled 2014-04-11: qty 5

## 2014-04-11 MED ORDER — KETOROLAC TROMETHAMINE 30 MG/ML IJ SOLN
INTRAMUSCULAR | Status: AC
  Filled 2014-04-11: qty 1

## 2014-04-11 MED ORDER — 0.9 % SODIUM CHLORIDE (POUR BTL) OPTIME
TOPICAL | Status: DC | PRN
  Administered 2014-04-11: 1000 mL

## 2014-04-11 MED ORDER — FENTANYL CITRATE 0.05 MG/ML IJ SOLN
25.0000 ug | INTRAMUSCULAR | Status: DC | PRN
  Administered 2014-04-11 (×2): 50 ug via INTRAVENOUS

## 2014-04-11 MED ORDER — ONDANSETRON HCL 4 MG/2ML IJ SOLN
4.0000 mg | Freq: Once | INTRAMUSCULAR | Status: AC | PRN
  Administered 2014-04-11: 4 mg via INTRAVENOUS
  Filled 2014-04-11: qty 2

## 2014-04-11 MED ORDER — PROPOFOL INFUSION 10 MG/ML OPTIME
INTRAVENOUS | Status: DC | PRN
  Administered 2014-04-11: 100 ug/kg/min via INTRAVENOUS

## 2014-04-11 MED ORDER — FENTANYL CITRATE 0.05 MG/ML IJ SOLN
INTRAMUSCULAR | Status: AC
  Filled 2014-04-11: qty 2

## 2014-04-11 MED ORDER — LIDOCAINE HCL (PF) 2 % IJ SOLN
INTRAMUSCULAR | Status: AC
  Filled 2014-04-11: qty 10

## 2014-04-11 MED ORDER — ONDANSETRON HCL 4 MG/2ML IJ SOLN
INTRAMUSCULAR | Status: DC | PRN
  Administered 2014-04-11: 4 mg via INTRAVENOUS

## 2014-04-11 MED ORDER — LIDOCAINE HCL (CARDIAC) 10 MG/ML IV SOLN
INTRAVENOUS | Status: DC | PRN
  Administered 2014-04-11: 10 mg via INTRAVENOUS

## 2014-04-11 MED ORDER — MIDAZOLAM HCL 2 MG/2ML IJ SOLN
1.0000 mg | INTRAMUSCULAR | Status: DC | PRN
  Administered 2014-04-11: 2 mg via INTRAVENOUS

## 2014-04-11 MED ORDER — LIDOCAINE HCL 1 % IJ SOLN
INTRAMUSCULAR | Status: DC | PRN
  Administered 2014-04-11: 9 mL

## 2014-04-11 MED ORDER — PROPOFOL 10 MG/ML IV EMUL
INTRAVENOUS | Status: AC
  Filled 2014-04-11: qty 20

## 2014-04-11 MED ORDER — KETOROLAC TROMETHAMINE 30 MG/ML IJ SOLN
30.0000 mg | Freq: Once | INTRAMUSCULAR | Status: AC
  Administered 2014-04-11: 30 mg via INTRAVENOUS

## 2014-04-11 MED ORDER — LIDOCAINE HCL (PF) 1 % IJ SOLN
INTRAMUSCULAR | Status: AC
  Filled 2014-04-11: qty 30

## 2014-04-11 MED ORDER — CEFAZOLIN SODIUM-DEXTROSE 2-3 GM-% IV SOLR
2.0000 g | INTRAVENOUS | Status: AC
  Administered 2014-04-11: 2 g via INTRAVENOUS
  Filled 2014-04-11: qty 50

## 2014-04-11 MED ORDER — CHLORHEXIDINE GLUCONATE 4 % EX LIQD: 1.0000 "application " | Freq: Once | CUTANEOUS | Status: AC

## 2014-04-11 MED ORDER — ONDANSETRON HCL 4 MG/2ML IJ SOLN
INTRAMUSCULAR | Status: AC
  Filled 2014-04-11: qty 2

## 2014-04-11 MED ORDER — LACTATED RINGERS IV SOLN
INTRAVENOUS | Status: DC
  Administered 2014-04-11: 09:00:00 via INTRAVENOUS

## 2014-04-11 SURGICAL SUPPLY — 34 items
BAG HAMPER (MISCELLANEOUS) ×3 IMPLANT
BLADE 15 SAFETY STRL DISP (BLADE) ×3 IMPLANT
CLOSURE WOUND 1/4 X3 (GAUZE/BANDAGES/DRESSINGS)
CLOTH BEACON ORANGE TIMEOUT ST (SAFETY) ×3 IMPLANT
COVER LIGHT HANDLE STERIS (MISCELLANEOUS) ×6 IMPLANT
DERMABOND ADVANCED (GAUZE/BANDAGES/DRESSINGS) ×2
DERMABOND ADVANCED .7 DNX12 (GAUZE/BANDAGES/DRESSINGS) ×1 IMPLANT
DURAPREP 26ML APPLICATOR (WOUND CARE) ×3 IMPLANT
ELECT REM PT RETURN 9FT ADLT (ELECTROSURGICAL) ×3
ELECTRODE REM PT RTRN 9FT ADLT (ELECTROSURGICAL) ×1 IMPLANT
FORMALIN 10 PREFIL 120ML (MISCELLANEOUS) IMPLANT
GLOVE BIO SURGEON STRL SZ7.5 (GLOVE) ×3 IMPLANT
GLOVE BIOGEL PI IND STRL 7.0 (GLOVE) ×1 IMPLANT
GLOVE BIOGEL PI IND STRL 7.5 (GLOVE) ×1 IMPLANT
GLOVE BIOGEL PI INDICATOR 7.0 (GLOVE) ×2
GLOVE BIOGEL PI INDICATOR 7.5 (GLOVE) ×2
GLOVE ECLIPSE 7.0 STRL STRAW (GLOVE) ×3 IMPLANT
GOWN STRL REUS W/TWL LRG LVL3 (GOWN DISPOSABLE) ×6 IMPLANT
KIT ROOM TURNOVER APOR (KITS) ×3 IMPLANT
MANIFOLD NEPTUNE II (INSTRUMENTS) ×3 IMPLANT
NEEDLE HYPO 18GX1.5 BLUNT FILL (NEEDLE) ×3 IMPLANT
NEEDLE HYPO 25X1 1.5 SAFETY (NEEDLE) ×3 IMPLANT
NS IRRIG 1000ML POUR BTL (IV SOLUTION) ×3 IMPLANT
PACK MINOR (CUSTOM PROCEDURE TRAY) ×3 IMPLANT
PAD ARMBOARD 7.5X6 YLW CONV (MISCELLANEOUS) ×3 IMPLANT
SET BASIN LINEN APH (SET/KITS/TRAYS/PACK) ×3 IMPLANT
SPONGE GAUZE 2X2 8PLY STER LF (GAUZE/BANDAGES/DRESSINGS)
SPONGE GAUZE 2X2 8PLY STRL LF (GAUZE/BANDAGES/DRESSINGS) IMPLANT
STRIP CLOSURE SKIN 1/4X3 (GAUZE/BANDAGES/DRESSINGS) IMPLANT
SUT SILK 2 0 SH (SUTURE) ×3 IMPLANT
SUT VIC AB 3-0 SH 27 (SUTURE)
SUT VIC AB 3-0 SH 27X BRD (SUTURE) IMPLANT
SUT VIC AB 4-0 PS2 27 (SUTURE) ×3 IMPLANT
SYR CONTROL 10ML LL (SYRINGE) ×3 IMPLANT

## 2014-04-11 NOTE — Anesthesia Postprocedure Evaluation (Signed)
  Anesthesia Post-op Note  Patient: Kelsey Ruiz  Procedure(s) Performed: Procedure(s) with comments: RIGHT BREAST BIOPSY AFTER NEEDLE LOCALIZATION (Right) - NEEDLE LOC @ 8:00  Patient Location: PACU  Anesthesia Type:MAC  Level of Consciousness: awake, alert  and patient cooperative  Airway and Oxygen Therapy: Patient Spontanous Breathing and Patient connected to nasal cannula oxygen  Post-op Pain: mild  Post-op Assessment: Post-op Vital signs reviewed, Patient's Cardiovascular Status Stable, Respiratory Function Stable and No signs of Nausea or vomiting  Post-op Vital Signs: Reviewed and stable  Last Vitals:  Filed Vitals:   04/11/14 1038  BP: 129/58  Pulse: 63  Temp: 36.5 C  Resp: 16    Complications: No apparent anesthesia complications

## 2014-04-11 NOTE — Anesthesia Procedure Notes (Addendum)
Procedure Name: MAC Date/Time: 04/11/2014 9:40 AM Performed by: Vista Deck Pre-anesthesia Checklist: Patient identified, Emergency Drugs available, Suction available, Timeout performed and Patient being monitored Patient Re-evaluated:Patient Re-evaluated prior to inductionOxygen Delivery Method: Nasal Cannula   Procedure Name: MAC Date/Time: 04/11/2014 9:55 AM Performed by: Vista Deck Pre-anesthesia Checklist: Patient identified, Emergency Drugs available, Suction available, Timeout performed and Patient being monitored Patient Re-evaluated:Patient Re-evaluated prior to inductionOxygen Delivery Method: Non-rebreather mask

## 2014-04-11 NOTE — Transfer of Care (Signed)
Immediate Anesthesia Transfer of Care Note  Patient: Kelsey Ruiz  Procedure(s) Performed: Procedure(s) (LRB): RIGHT BREAST BIOPSY AFTER NEEDLE LOCALIZATION (Right)  Patient Location: PACU  Anesthesia Type: MAC  Level of Consciousness: awake  Airway & Oxygen Therapy: Patient Spontanous Breathing. Nasal cannula  Post-op Assessment: Report given to PACU RN, Post -op Vital signs reviewed and stable and Patient moving all extremities  Post vital signs: Reviewed and stable  Complications: No apparent anesthesia complications

## 2014-04-11 NOTE — Discharge Instructions (Signed)
Breast Biopsy  °Care After °Refer to this sheet in the next few weeks. These instructions provide you with information on caring for yourself after your procedure. Your caregiver may also give you more specific instructions. Your treatment has been planned according to current medical practices, but problems sometimes occur. Call your caregiver if you have any problems or questions after your procedure. °HOME CARE INSTRUCTIONS  °· Only take over-the-counter or prescription medicines for pain, discomfort, or fever as directed by your caregiver. °· Do not take aspirin. It can cause bleeding. °· Keep stitches dry when bathing. °· Protect the biopsy area. Do not let the area get bumped. °· Avoid activities that may pull the incision site open until approved by your caregiver. This can include stretching, reaching, exercise, sports, or lifting over 3 pounds. °· Resume your usual diet. °· Wear a good support bra for as long as directed by your caregiver. °· Change any bandages (dressings) as directed by your caregiver. °· Do not drink alcohol while taking pain medicine. °· Keep all your follow-up appointments with your caregiver. Ask when your test results will be ready. Make sure you get your test results. °SEEK MEDICAL CARE IF:  °· You have redness, swelling, or increasing pain in the biopsy site. °· You have a bad smell coming from the biopsy site or dressing. °· Your biopsy site breaks open after the stitches (sutures), staples, or skin adhesive strips have been removed. °· You have a rash. °· You need stronger medicine. °SEEK IMMEDIATE MEDICAL CARE IF:  °· You have a fever. °· You have increased bleeding (more than a small spot) from the biopsy site. °· You have difficulty breathing. °· You have pus coming from the biopsy site. °MAKE SURE YOU: °· Understand these instructions. °· Will watch your condition. °· Will get help right away if you are not doing well or get worse. °Document Released: 06/19/2005 Document  Revised: 02/22/2012 Document Reviewed: 12/31/2011 °Exit[employer]® Patient Information ©2014 [employer]. ° °

## 2014-04-11 NOTE — Interval H&P Note (Signed)
History and Physical Interval Note:  04/11/2014 8:20 AM  Kelsey Ruiz  has presented today for surgery, with the diagnosis of RT BREAST NEOPLASM  The various methods of treatment have been discussed with the patient and family. After consideration of risks, benefits and other options for treatment, the patient has consented to  Procedure(s) with comments: BREAST BIOPSY WITH NEEDLE LOCALIZATION (Right) - NEEDLE LOC @ 8:00 as a surgical intervention .  The patient's history has been reviewed, patient examined, no change in status, stable for surgery.  I have reviewed the patient's chart and labs.  Questions were answered to the patient's satisfaction.     Jamesetta So

## 2014-04-11 NOTE — Op Note (Signed)
Patient:  Kelsey Ruiz  DOB:  Oct 11, 1929  MRN:  [id number]   Preop Diagnosis:  Right breast neoplasm  Postop Diagnosis:  Same  Procedure:  Right breast biopsy after needle localization  Surgeon:  Aviva Signs, M.D.  Anes:  MAC  Indications:  Patient is an 80 year old white female who was found on core biopsy the right breast to have a suspicious lesion in the right breast. Patient now comes the operating room for a right breast biopsy after needle localization. Risks and benefits of the procedure were fully explained to the patient, gave informed consent.  Procedure note:  The patient underwent right breast needle localization in the mammography department. The patient then came to the OR and was placed in the supine position. After monitored anesthesia care was given, the right breast was prepped and draped using usual sterile technique with DuraPrep. Surgical site confirmation was performed. 1% Xylocaine was used for local anesthesia.  An incision was made medial to the entrance of the guidewire as this was at the 9:00 position in the right breast. The dissection was taken down to the area of concern. This was removed without difficulty. A short suture was placed superiorly and a long suture laterally for orientation purposes. Specimen radiography was performed which revealed the suspicious area and clipped to be within the specimen removed. The specimen was then sent to pathology further examination. Any bleeding was controlled using Bovie electrocautery. The wound was irrigated normal saline. The skin was closed using a 4 Vicryl subcuticular suture. Dermabond was applied.  All tape and needle counts were correct at the end of the procedure. Patient was transferred to PACU in stable condition.  Complications:  None  EBL:  Minimal  Specimen:  Right breast biopsy, short suture superior, long suture lateral

## 2014-04-11 NOTE — Anesthesia Preprocedure Evaluation (Addendum)
Anesthesia Evaluation  Patient identified by MRN, date of birth, ID band Patient awake    Reviewed: Allergy & Precautions, H&P , NPO status , Patient's Chart, lab work & pertinent test results  Airway Mallampati: I TM Distance: >3 FB Neck ROM: Full    Dental  (+) Teeth Intact   Pulmonary former smoker,    Pulmonary exam normal       Cardiovascular hypertension, Rhythm:Regular     Neuro/Psych negative neurological ROS  negative psych ROS   GI/Hepatic GERD-  Controlled and Medicated,  Endo/Other  Hypothyroidism   Renal/GU      Musculoskeletal  (+) Arthritis -, Osteoarthritis,    Abdominal Normal abdominal exam  (+)   Peds  Hematology   Anesthesia Other Findings   Reproductive/Obstetrics negative OB ROS                          Anesthesia Physical Anesthesia Plan  ASA: II  Anesthesia Plan: MAC   Post-op Pain Management:    Induction: Intravenous  Airway Management Planned: Nasal Cannula  Additional Equipment:   Intra-op Plan:   Post-operative Plan:   Informed Consent: I have reviewed the patients History and Physical, chart, labs and discussed the procedure including the risks, benefits and alternatives for the proposed anesthesia with the patient or authorized representative who has indicated his/her understanding and acceptance.     Plan Discussed with: CRNA  Anesthesia Plan Comments: (IV sedation)        Anesthesia Quick Evaluation

## 2014-04-12 ENCOUNTER — Encounter (HOSPITAL_COMMUNITY): Payer: Self-pay | Admitting: General Surgery

## 2014-04-17 ENCOUNTER — Ambulatory Visit (HOSPITAL_COMMUNITY): Payer: Medicare Other

## 2014-05-08 ENCOUNTER — Other Ambulatory Visit (HOSPITAL_COMMUNITY): Payer: Self-pay | Admitting: Family Medicine

## 2014-05-08 ENCOUNTER — Ambulatory Visit (HOSPITAL_COMMUNITY)
Admission: RE | Admit: 2014-05-08 | Discharge: 2014-05-08 | Disposition: A | Discharge status: Home / Self Care | Payer: Medicare Other | Source: Ambulatory Visit | Attending: Family Medicine | Admitting: Family Medicine

## 2014-05-08 DIAGNOSIS — M1610 Unilateral primary osteoarthritis, unspecified hip: Secondary | ICD-10-CM | POA: Insufficient documentation

## 2014-05-08 DIAGNOSIS — M169 Osteoarthritis of hip, unspecified: Secondary | ICD-10-CM

## 2014-05-09 ENCOUNTER — Emergency Department (HOSPITAL_COMMUNITY)
Admission: EM | Admit: 2014-05-09 | Discharge: 2014-05-09 | Disposition: A | Discharge status: Home / Self Care | Payer: Medicare Other | Attending: Emergency Medicine | Admitting: Emergency Medicine

## 2014-05-09 ENCOUNTER — Encounter (HOSPITAL_COMMUNITY): Payer: Self-pay | Admitting: Emergency Medicine

## 2014-05-09 DIAGNOSIS — E785 Hyperlipidemia, unspecified: Secondary | ICD-10-CM | POA: Insufficient documentation

## 2014-05-09 DIAGNOSIS — R42 Dizziness and giddiness: Secondary | ICD-10-CM | POA: Insufficient documentation

## 2014-05-09 DIAGNOSIS — Z9889 Other specified postprocedural states: Secondary | ICD-10-CM | POA: Insufficient documentation

## 2014-05-09 DIAGNOSIS — Z7982 Long term (current) use of aspirin: Secondary | ICD-10-CM | POA: Insufficient documentation

## 2014-05-09 DIAGNOSIS — Z87891 Personal history of nicotine dependence: Secondary | ICD-10-CM | POA: Insufficient documentation

## 2014-05-09 DIAGNOSIS — Z79899 Other long term (current) drug therapy: Secondary | ICD-10-CM | POA: Insufficient documentation

## 2014-05-09 DIAGNOSIS — N61 Mastitis without abscess: Secondary | ICD-10-CM | POA: Insufficient documentation

## 2014-05-09 DIAGNOSIS — E039 Hypothyroidism, unspecified: Secondary | ICD-10-CM | POA: Insufficient documentation

## 2014-05-09 DIAGNOSIS — Y838 Other surgical procedures as the cause of abnormal reaction of the patient, or of later complication, without mention of misadventure at the time of the procedure: Secondary | ICD-10-CM | POA: Insufficient documentation

## 2014-05-09 DIAGNOSIS — N611 Abscess of the breast and nipple: Secondary | ICD-10-CM

## 2014-05-09 DIAGNOSIS — Z8669 Personal history of other diseases of the nervous system and sense organs: Secondary | ICD-10-CM | POA: Insufficient documentation

## 2014-05-09 DIAGNOSIS — K219 Gastro-esophageal reflux disease without esophagitis: Secondary | ICD-10-CM | POA: Insufficient documentation

## 2014-05-09 DIAGNOSIS — I10 Essential (primary) hypertension: Secondary | ICD-10-CM | POA: Insufficient documentation

## 2014-05-09 DIAGNOSIS — M129 Arthropathy, unspecified: Secondary | ICD-10-CM | POA: Insufficient documentation

## 2014-05-09 MED ORDER — DOXYCYCLINE HYCLATE 100 MG PO CAPS: 100.0000 mg | ORAL_CAPSULE | Freq: Two times a day (BID) | ORAL | Status: DC

## 2014-05-09 NOTE — ED Notes (Signed)
Pt. Instructed to place warm washcloths on area. Pt. Instructed to call Dr. Arnoldo Morale tomorrow to schedule a follow-up appointment. Pt. Instructed to return for increased pain, fever, chills, and any other new or worsening symptoms. Pt. Verbalized understanding.

## 2014-05-09 NOTE — ED Notes (Signed)
Dr. Alvino Chapel able to drain medium amount of clear-yellow fluid from wound. Pt. States "It feels better now". Wound culture collected. 4x4 dressing placed with paper tape by Dr. Alvino Chapel.

## 2014-05-09 NOTE — ED Notes (Signed)
Dr Pickering at bedside 

## 2014-05-09 NOTE — ED Provider Notes (Signed)
CSN: [id number]     Arrival date & time 05/09/14  1843 History   First MD Initiated Contact with Patient 05/09/14 1931     Chief Complaint  Patient presents with  . Infection, s/p lumpectomy      (Consider location/radiation/quality/duration/timing/severity/associated sxs/prior Treatment) The history is provided by the patient.   patient had a breast mass removed by Dr. Arnoldo Morale 3-4 weeks ago. She had been doing well but it has been more for the last few days. Today it "popped" and some "pus" came out. She states then she began to feel lightheaded. She feels better now. His been turning more red. No fevers. She is not on chemotherapy. The plan was to follow with Dr. Arnoldo Morale in a few more months.  Past Medical History  Diagnosis Date  . Hypertension   . Glaucoma   . GERD (gastroesophageal reflux disease)   . Hypothyroidism   . Hyperlipemia   . Arthritis    Past Surgical History  Procedure Laterality Date  . Abdominal hysterectomy    . Laser eyes Bilateral     for glaucoma  . Thyroidectomy    . Breast surgery Bilateral     benign cysts  . Breast biopsy Right 04/11/2014    Procedure: RIGHT BREAST BIOPSY AFTER NEEDLE LOCALIZATION;  Surgeon: Jamesetta So, MD;  Location: [facility];  Service: General;  Laterality: Right;  NEEDLE LOC @ 8:00   No family history on file. History  Substance Use Topics  . Smoking status: Former Smoker -- 0.50 packs/day for 10 years    Types: Cigarettes    Quit date: 04/05/1984  . Smokeless tobacco: Not on file  . Alcohol Use: No   OB History   Grav Para Term Preterm Abortions TAB SAB Ect Mult Living                 Review of Systems  Constitutional: Negative for fever and chills.  Respiratory: Negative for shortness of breath.   Cardiovascular: Negative for chest pain.  Skin: Positive for color change and wound.  Neurological: Positive for light-headedness.      Allergies  Review of patient's allergies indicates no known allergies.  Home  Medications   Prior to Admission medications   Medication Sig Start Date End Date Taking? Authorizing Provider  alendronate (FOSAMAX) 70 MG tablet Take 70 mg by mouth once a week. Take with a full glass of water on an empty stomach.   Yes Historical Provider, MD  aspirin EC 325 MG tablet Take 325 mg by mouth every morning.   Yes Historical Provider, MD  atorvastatin (LIPITOR) 40 MG tablet Take 40 mg by mouth every evening.    Yes Historical Provider, MD  Calcium-Magnesium-Zinc (CAL-MAG-ZINC PO) Take 1 tablet by mouth every morning.    Yes Historical Provider, MD  cholecalciferol (VITAMIN D) 1000 UNITS tablet Take 1,000 Units by mouth every morning.    Yes Historical Provider, MD  Coenzyme Q10 200 MG capsule Take 200 mg by mouth every morning.    Yes Historical Provider, MD  HYDROcodone-acetaminophen (NORCO/VICODIN) 5-325 MG per tablet Take 0.5 tablets by mouth every 6 (six) hours as needed for moderate pain.   Yes Historical Provider, MD  levothyroxine (SYNTHROID, LEVOTHROID) 75 MCG tablet Take 75 mcg by mouth daily before breakfast.   Yes Historical Provider, MD  omeprazole (PRILOSEC) 40 MG capsule Take 40 mg by mouth every morning.    Yes Historical Provider, MD  traMADol (ULTRAM) 50 MG tablet Take 50 mg  by mouth 3 (three) times daily as needed. For pan 05/08/14  Yes Historical Provider, MD  vitamin B-12 (CYANOCOBALAMIN) 100 MCG tablet Take 100 mcg by mouth every morning.    Yes Historical Provider, MD  vitamin C (ASCORBIC ACID) 500 MG tablet Take 500 mg by mouth every morning.    Yes Historical Provider, MD   BP 120/59  Pulse 75  Temp(Src) 98.4 F (36.9 C) (Oral)  Resp 20  Ht 5' 2.5" (1.588 m)  Wt 159 lb (72.122 kg)  BMI 28.60 kg/m2  SpO2 97% Physical Exam  Constitutional: She appears well-developed.  Cardiovascular: Normal rate and regular rhythm.   Pulmonary/Chest:  Right breast has previous surgical site a horizontal and lateral to the nipple. There is a fair amount of erythema  with some swelling. With pressure applied serious and possibly purulent drainage comes out of the middle of the previous surgical wound.. There is a moderate amount of drainage.  Skin: Skin is warm. There is erythema.  Warmth at the site of redness on the breast on the right    ED Course  Procedures (including critical care time) Labs Review Labs Reviewed  WOUND CULTURE    Imaging Review Dg Hip Complete Right  05/08/2014   CLINICAL DATA:  Osteoarthrosis  EXAM: RIGHT HIP - COMPLETE 2+ VIEW  COMPARISON:  November 28, 2011  FINDINGS: Frontal pelvis as well as frontal and lateral right hip images were obtained. There is no fracture or dislocation. Joint spaces appear intact. No erosive change.  IMPRESSION: No fracture or appreciable arthropathy.   Electronically Signed   By: Lowella Grip M.D.   On: 05/08/2014 08:45     EKG Interpretation None      MDM   Final diagnoses:  Abscess of breast    Patient with likely serous fluid collection from previous lumpectomy that became infected. Culture sent. Discussed with Dr. Arnoldo Morale, who will see the patient in followup early next week. Will start antibiotics    Jasper Riling. Alvino Chapel, MD 05/09/14 2103

## 2014-05-09 NOTE — Discharge Instructions (Signed)
Abscess An abscess is an infected area that contains a collection of pus and debris.It can occur in almost any part of the body. An abscess is also known as a furuncle or boil. CAUSES  An abscess occurs when tissue gets infected. This can occur from blockage of oil or sweat glands, infection of hair follicles, or a minor injury to the skin. As the body tries to fight the infection, pus collects in the area and creates pressure under the skin. This pressure causes pain. People with weakened immune systems have difficulty fighting infections and get certain abscesses more often.  SYMPTOMS Usually an abscess develops on the skin and becomes a painful mass that is red, warm, and tender. If the abscess forms under the skin, you may feel a moveable soft area under the skin. Some abscesses break open (rupture) on their own, but most will continue to get worse without care. The infection can spread deeper into the body and eventually into the bloodstream, causing you to feel ill.  DIAGNOSIS  Your caregiver will take your medical history and perform a physical exam. A sample of fluid may also be taken from the abscess to determine what is causing your infection. TREATMENT  Your caregiver may prescribe antibiotic medicines to fight the infection. However, taking antibiotics alone usually does not cure an abscess. Your caregiver may need to make a small cut (incision) in the abscess to drain the pus. In some cases, gauze is packed into the abscess to reduce pain and to continue draining the area. HOME CARE INSTRUCTIONS   Only take over-the-counter or prescription medicines for pain, discomfort, or fever as directed by your caregiver.  If you were prescribed antibiotics, take them as directed. Finish them even if you start to feel better.  If gauze is used, follow your caregiver's directions for changing the gauze.  To avoid spreading the infection:  Keep your draining abscess covered with a  bandage.  Wash your hands well.  Do not share personal care items, towels, or whirlpools with others.  Avoid skin contact with others.  Keep your skin and clothes clean around the abscess.  Keep all follow-up appointments as directed by your caregiver. SEEK MEDICAL CARE IF:   You have increased pain, swelling, redness, fluid drainage, or bleeding.  You have muscle aches, chills, or a general ill feeling.  You have a fever. MAKE SURE YOU:   Understand these instructions.  Will watch your condition.  Will get help right away if you are not doing well or get worse. Document Released: 09/09/2005 Document Revised: 05/31/2012 Document Reviewed: 02/12/2012 [employer] Patient Information 2014 [employer].  

## 2014-05-09 NOTE — ED Notes (Addendum)
Pt reports had lumpectomy on r breast 2 weeks ago.  Reports was sitting on the couch and suddenly incision burst open and drainage soaked her clothes.  Reports seeing pus in drainage.  Area around incision red.  Pt c/o area tender.  Pt also reports has arthritis in r hip and had a steroid injection last week and a pain shot yesterday.  Pt says has not been able to walk independently due to the hip for the past few days.

## 2014-05-12 LAB — WOUND CULTURE

## 2014-05-13 ENCOUNTER — Telehealth (HOSPITAL_BASED_OUTPATIENT_CLINIC_OR_DEPARTMENT_OTHER): Payer: Self-pay | Admitting: Emergency Medicine

## 2014-05-13 NOTE — Telephone Encounter (Signed)
Post ED Visit - Positive Culture Follow-up  Culture report reviewed by antimicrobial stewardship pharmacist: []  Wes [place], Pharm.D., BCPS []  Heide Guile, Pharm.D., BCPS [x]  Alycia Rossetti, Pharm.D., BCPS []  [place], Pharm.D., BCPS, AAHIVP []  Legrand Como, Pharm.D., BCPS, AAHIVP []  Juliene Pina, Pharm.D.  Positive wound culture Treated with Doxycycline, organism sensitive to the same and no further patient follow-up is required at this time.  Edwardo Wojnarowski 05/13/2014, 4:21 PM

## 2014-06-04 ENCOUNTER — Telehealth (HOSPITAL_COMMUNITY): Payer: Self-pay

## 2014-06-07 ENCOUNTER — Ambulatory Visit (HOSPITAL_COMMUNITY): Payer: Medicare Other | Admitting: Physical Therapy

## 2014-06-21 ENCOUNTER — Ambulatory Visit (HOSPITAL_COMMUNITY)
Admission: RE | Admit: 2014-06-21 | Discharge: 2014-06-21 | Disposition: A | Discharge status: Home / Self Care | Payer: Medicare Other | Source: Ambulatory Visit | Attending: Family Medicine | Admitting: Family Medicine

## 2014-06-21 DIAGNOSIS — M25559 Pain in unspecified hip: Secondary | ICD-10-CM | POA: Diagnosis not present

## 2014-06-21 DIAGNOSIS — IMO0001 Reserved for inherently not codable concepts without codable children: Secondary | ICD-10-CM | POA: Diagnosis not present

## 2014-06-21 DIAGNOSIS — R269 Unspecified abnormalities of gait and mobility: Secondary | ICD-10-CM | POA: Diagnosis not present

## 2014-06-21 DIAGNOSIS — E785 Hyperlipidemia, unspecified: Secondary | ICD-10-CM | POA: Insufficient documentation

## 2014-06-21 DIAGNOSIS — I10 Essential (primary) hypertension: Secondary | ICD-10-CM | POA: Insufficient documentation

## 2014-06-21 DIAGNOSIS — M25551 Pain in right hip: Secondary | ICD-10-CM

## 2014-06-21 DIAGNOSIS — M25659 Stiffness of unspecified hip, not elsewhere classified: Secondary | ICD-10-CM | POA: Diagnosis not present

## 2014-06-21 DIAGNOSIS — R29898 Other symptoms and signs involving the musculoskeletal system: Secondary | ICD-10-CM | POA: Diagnosis not present

## 2014-06-21 DIAGNOSIS — G8929 Other chronic pain: Secondary | ICD-10-CM | POA: Insufficient documentation

## 2014-06-21 NOTE — Evaluation (Signed)
Physical Therapy Evaluation  Patient Details  Name: Kelsey Ruiz MRN: [id number] Date of Birth: 16-Mar-1929  Today's Date: 06/21/2014 Time: 1761-6073 PT Time Calculation (min): 55 min    Charges: 1 Evaluation, TherEx 1720-1730          Visit#: 1 of 16  Re-eval: 07/21/14 Assessment Diagnosis: lowback hip pain  Prior Therapy: no  Authorization: [employer] - Medicare     Past Medical History:  Past Medical History  Diagnosis Date  . Hypertension   . Glaucoma   . GERD (gastroesophageal reflux disease)   . Hypothyroidism   . Hyperlipemia   . Arthritis    Past Surgical History:  Past Surgical History  Procedure Laterality Date  . Abdominal hysterectomy    . Laser eyes Bilateral     for glaucoma  . Thyroidectomy    . Breast surgery Bilateral     benign cysts  . Breast biopsy Right 04/11/2014    Procedure: RIGHT BREAST BIOPSY AFTER NEEDLE LOCALIZATION;  Surgeon: Jamesetta So, MD;  Location: [facility];  Service: General;  Laterality: Right;  NEEDLE LOC @ 8:00    Subjective Symptoms/Limitations Symptoms: Patient's primary complain is her hip .  Pertinent History: Patient's hip pain began the second weelk of may and nor radiates into lower backl and anteriro and posterio thigh, radiates into Rt foot. 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,  Limitations: Sitting How long can you sit comfortably?: <45minutes How long can you stand comfortably?: <10minutes How long can you walk comfortably?: <55minutes Patient Stated Goals: to get back to cooking, cleaning, walking, and mowing lawn. to get rid of walker Pain Assessment Currently in Pain?: Yes Pain Score: 9  Pain Location: Hip Pain Orientation: Right Pain Type: Chronic pain Pain Radiating Towards: foot, Rt leg, up to back Pain Onset: 1 to 4 weeks ago Pain Frequency: Intermittent Pain Relieving Factors: Ice and laying down.   Prior Functioning   independent with all IADLs and  ADLs  Cognition/Observation Observation/Other Assessments Observations: Forward lean secondary to raised walker improved follwoing lowering walker, Rt knee instability  Sensation/Coordination/Flexibility/Functional Tests Flexibility Thomas: Positive 90/90: Negative  Assessment RLE AROM (degrees) Right Hip Extension: 5 Right Hip Flexion: 80 Right Knee Extension: 0 Right Knee Flexion: 120 Right Ankle Dorsiflexion: 6 RLE Strength Right Hip Flexion: 2+/5 Right Hip Extension: 2+/5 Right Hip External Rotation : 34 Right Hip Internal Rotation : 23 Right Hip ABduction: 2+/5 Right Knee Flexion: 3/5 Right Knee Extension: 4/5 Right Ankle Dorsiflexion: 4/5  Exercise/Treatments Piriformis stretch 3 sets 3x 10seconds  Physical Therapy Assessment and Plan PT Assessment and Plan Clinical Impression Statement: Patient is a pleasantw woman who ambualteds into therapy with excessive forward leaing onto a walker secondary to the walker being to high. Follwoign correction of walker patient posture and gait significantly improved with patient noting decreased tension in arms and shoulders. Patient's primary complaint of Rt hip pain attrbuted to Piriforis muscle tendonopathy resultign in increased pressure on the sciativc nerve and subsequent radiatign symptoms of pain and weakness down Rt LE. FOllwoign stretching patient's Rt hip pain improved. Contributing foctors to patient's hip pain include weakn hamstrngs glut maximus, and glut medius resultign in excessive strain on piriformis msucle and subsequent increased pressure on the sciatic nerve. patient will benefit from skilled phsycial therapy to increase piriformis msucle mobilty and increase gluteal msucle strength, and subsequently improve patient ability to walk so patient can return to prior level of function including ambulating withtou an assistive  device.        Pt will benefit from skilled therapeutic intervention in order to improve on the  following deficits: Abnormal gait;Decreased activity tolerance;Decreased balance;Decreased mobility;Decreased range of motion;Decreased strength;Difficulty walking;Increased fascial restricitons;Increased muscle spasms;Impaired flexibility;Impaired sensation;Improper body mechanics;Pain Rehab Potential: Good Clinical Impairments Affecting Rehab Potential: highly motivated, no prior history of injury PT Frequency: Min 2X/week PT Duration: 8 weeks PT Treatment/Interventions: Gait training;Stair training;Functional mobility training;Therapeutic activities;Therapeutic exercise;Balance training;Patient/family education;Manual techniques;Modalities PT Plan: Initial focus on increasesing piriformis, hip flexor, and gastroc flexibitly . As pain improves progress to increasign glut max and med/min strength    Goals Home Exercise Program Pt/caregiver will Perform Home Exercise Program: For increased strengthening;For increased ROM PT Goal: Perform Home Exercise Program - Progress: Goal set today PT Short Term Goals Time to Complete Short Term Goals: 4 weeks PT Short Term Goal 1: Patient will demosnstrate increased hip internal rotaion mobility of 40 degrees indicatign improved deceleration of landing durign gait PT Short Term Goal 2: Patient will increase hip extension to >10 degrees to increase stride length PT Short Term Goal 3: Patient will demosntrate increased ankle dorsiflexion of 18 degrees to decrease early heel off during gait PT Short Term Goal 4: Patient will demosntrate a negative pirfimornis mobility test with pain <2/10 indicatign improved ability to sit with pain <2/10 and improved ability to cross her Rt ankkle over her Lt knee while sitting PT Long Term Goals Time to Complete Long Term Goals: 8 weeks PT Long Term Goal 1: Patient will demsontrate hip extension strength of 4/5 to be able to sit from the chair 5x in <15seconds indicating patient not at high risk of falls PT Long Term Goal 2:  Patient will be able to walk 9minutes withtou and assitive device with pain <2/10 Long Term Goal 3: Patient will demonstrate a TUG of <15 seconds indicatign patient is able to quickly stand up from a chair and walk without a risk of falling.  Long Term Goal 4: Patient will demonstrate hip abduction strength of 4/5 with MMT indicatign patient is able to stand one leg >10 seconds.   Problem List Patient Active Problem List   Diagnosis Date Noted  . Abnormality of gait 06/21/2014  . Pain in joint, pelvic region and thigh 06/21/2014  . Stiffness of joint, not elsewhere classified, pelvic region and thigh 06/21/2014    PT - End of Session Activity Tolerance: Patient tolerated treatment well General Behavior During Therapy: WFL for tasks assessed/performed PT Plan of Care PT Home Exercise Plan: Piriformis stretch.   GP Functional Assessment Tool Used: FOTO 82% limited Functional Limitation: Mobility: Walking and moving around Mobility: Walking and Moving Around Current Status ([id number]): At least 80 percent but less than 100 percent impaired, limited or restricted Mobility: Walking and Moving Around Goal Status [phone]): At least 60 percent but less than 80 percent impaired, limited or restricted  Kelsey Ruiz 06/21/2014, 7:36 PM  Physician Documentation Your signature is required to indicate approval of the treatment plan as stated above.  Please sign and either send electronically or make a copy of this report for your files and return this physician signed original.   Please mark one 1.__approve of plan  2. ___approve of plan with the following conditions.   ______________________________  _____________________ Physician Signature                                                                                                             Date

## 2014-06-27 ENCOUNTER — Ambulatory Visit (HOSPITAL_COMMUNITY)
Admission: RE | Admit: 2014-06-27 | Discharge: 2014-06-27 | Disposition: A | Discharge status: Home / Self Care | Payer: Medicare Other | Source: Ambulatory Visit | Attending: Family Medicine | Admitting: Family Medicine

## 2014-06-27 NOTE — Progress Notes (Signed)
Physical Therapy Treatment Patient Details  Name: ZYLPHA POYNOR MRN: [id number] Date of Birth: 10/04/1929  Today's Date: 06/27/2014 Time: 0800-0845 PT Time Calculation (min): 45 min Charge TE [id number]  Visit#: 2 of 16  Re-eval: 07/21/14    Authorization: [employer] - Medicare  Authorization Time Period:    Authorization Visit#:   of     Subjective: Symptoms/Limitations Symptoms: Pt rstated the stretches hurt but she knows it will help in the end.  Reported pain with weight bearing to Rt LE.  Current pain scale 7/10 with no pain medication Pain Assessment Currently in Pain?: Yes Pain Score: 7  Pain Location: Hip Pain Orientation: Right  Objective:   Exercise/Treatments Stretches Active Hamstring Stretch: 3 reps;30 seconds;Limitations Active Hamstring Stretch Limitations: 3 directions standing on 14in box Hip Flexor Stretch: 3 reps;20 seconds;Limitations Hip Flexor Stretch Limitations: on 14 in box Piriformis Stretch: 4 reps;20 seconds;Limitations Piriformis Stretch Limitations: seated 3 directions  Standing Other Standing Lumbar Exercises: gastroc stretch on slant board 3x 30" Other Standing Lumbar Exercises: 3D hip excursion and rocker board 2 min R/L Seated Other Seated Lumbar Exercises: 3D thoracic excursion with UE movement     Physical Therapy Assessment and Plan PT Assessment and Plan Clinical Impression Statement: Began PT POC instructing stretches and exercises to improve hip and thoracic mobility.  Pt standing and gait with decreased stance phase Rt LE due to pain.  Begam rocker board to improve weight distrubtion.  Cueing through session to improve posture.  Pt educated on importance of posture to improve loading with weight bearing  over Rt LE.   PT Plan: Initial focus on increasesing piriformis, hip flexor, and gastroc flexibitly . As pain improves progress to increasign glut max and med/min strength    Goals PT Short Term Goals PT Short Term Goal 1: Patient  will demosnstrate increased hip internal rotaion mobility of 40 degrees indicatign improved deceleration of landing durign gait PT Short Term Goal 1 - Progress: Progressing toward goal PT Short Term Goal 2: Patient will increase hip extension to >10 degrees to increase stride length PT Short Term Goal 2 - Progress: Progressing toward goal PT Short Term Goal 3: Patient will demosntrate increased ankle dorsiflexion of 18 degrees to decrease early heel off during gait PT Short Term Goal 3 - Progress: Progressing toward goal PT Short Term Goal 4: Patient will demosntrate a negative pirfimornis mobility test with pain <2/10 indicatign improved ability to sit with pain <2/10 and improved ability to cross her Rt ankkle over her Lt knee while sitting PT Short Term Goal 4 - Progress: Progressing toward goal  Problem List Patient Active Problem List   Diagnosis Date Noted  . Abnormality of gait 06/21/2014  . Pain in joint, pelvic region and thigh 06/21/2014  . Stiffness of joint, not elsewhere classified, pelvic region and thigh 06/21/2014    PT - End of Session Activity Tolerance: Patient tolerated treatment well General Behavior During Therapy: [facility] for tasks assessed/performed  GP    Aldona Lento 06/27/2014, 9:09 AM

## 2014-06-28 ENCOUNTER — Ambulatory Visit (HOSPITAL_COMMUNITY)
Admission: RE | Admit: 2014-06-28 | Discharge: 2014-06-28 | Disposition: A | Discharge status: Home / Self Care | Payer: Medicare Other | Source: Ambulatory Visit | Attending: Family Medicine | Admitting: Family Medicine

## 2014-06-28 DIAGNOSIS — IMO0001 Reserved for inherently not codable concepts without codable children: Secondary | ICD-10-CM | POA: Diagnosis not present

## 2014-06-28 NOTE — Progress Notes (Signed)
Physical Therapy Treatment Patient Details  Name: Kelsey Ruiz MRN: [id number] Date of Birth: Oct 09, 1929  Today's Date: 06/28/2014 Time: 3295-1884 PT Time Calculation (min): 39 min Charge: TE [id number]  Visit#: 3 of 16  Re-eval: 07/21/14    Authorization: [employer] - Medicare  Authorization Time Period:    Authorization Visit#:   of     Subjective: Symptoms/Limitations Symptoms: HEP compliance 2-3x a day,  Rt LE pain scale 6/10.  Reported she took a hot bath following last session which helped with soreness. Pain Assessment Currently in Pain?: Yes Pain Score: 6  Pain Location: Leg Pain Orientation: Right  Objective:   Exercise/Treatments Stretches Active Hamstring Stretch: 3 reps;30 seconds;Limitations Active Hamstring Stretch Limitations: 3 directions standing on 14in box Hip Flexor Stretch: 3 reps;20 seconds;Limitations Hip Flexor Stretch Limitations: on 14 in box ITB Stretch: 3 reps;20 seconds;Limitations ITB Stretch Limitations: standing between 2 walls Piriformis Stretch: 4 reps;20 seconds;Limitations Piriformis Stretch Limitations: seated 3 directions  Standing Other Standing Lumbar Exercises: gastroc stretch on slant board 3x 30" Other Standing Lumbar Exercises: 3D hip excursion and rocker board 2 min R/L Seated Other Seated Lumbar Exercises: 3D thoracic excursion with red therapeutic ball movement    Physical Therapy Assessment and Plan PT Assessment and Plan Clinical Impression Statement: Pt's overall hip mobility is improving, increased ease with crossing legs for piriformis stretches and increased ease wtih sit to stand today.  Added ITBand stretch to POC to improve hip mobitly and reduce pain.  Pt does continues to stand and walk with less stance phase on Rt LE and forward flexed posture due to pain.  Pt able to verbalize and demonstrate importance of equal stance phase and proper posture and reports pain relief following equalize stance phase. PT Plan:  Initial focus on increasesing piriformis, hip flexor, and gastroc flexibitly . As pain improves progress to increasign glut max and med/min strength    Goals PT Short Term Goals PT Short Term Goal 1: Patient will demosnstrate increased hip internal rotaion mobility of 40 degrees indicatign improved deceleration of landing durign gait PT Short Term Goal 1 - Progress: Progressing toward goal PT Short Term Goal 2: Patient will increase hip extension to >10 degrees to increase stride length PT Short Term Goal 2 - Progress: Progressing toward goal PT Short Term Goal 3: Patient will demosntrate increased ankle dorsiflexion of 18 degrees to decrease early heel off during gait PT Short Term Goal 3 - Progress: Progressing toward goal PT Short Term Goal 4: Patient will demosntrate a negative pirfimornis mobility test with pain <2/10 indicatign improved ability to sit with pain <2/10 and improved ability to cross her Rt ankkle over her Lt knee while sitting PT Short Term Goal 4 - Progress: Progressing toward goal  Problem List Patient Active Problem List   Diagnosis Date Noted  . Abnormality of gait 06/21/2014  . Pain in joint, pelvic region and thigh 06/21/2014  . Stiffness of joint, not elsewhere classified, pelvic region and thigh 06/21/2014    PT - End of Session Activity Tolerance: Patient tolerated treatment well General Behavior During Therapy: [facility] for tasks assessed/performed  GP    Aldona Lento 06/28/2014, 2:32 PM

## 2014-07-03 ENCOUNTER — Ambulatory Visit (HOSPITAL_COMMUNITY)
Admission: RE | Admit: 2014-07-03 | Discharge: 2014-07-03 | Disposition: A | Discharge status: Home / Self Care | Payer: Medicare Other | Source: Ambulatory Visit | Attending: Family Medicine | Admitting: Family Medicine

## 2014-07-03 ENCOUNTER — Encounter (INDEPENDENT_AMBULATORY_CARE_PROVIDER_SITE_OTHER): Payer: Self-pay

## 2014-07-03 DIAGNOSIS — IMO0001 Reserved for inherently not codable concepts without codable children: Secondary | ICD-10-CM | POA: Diagnosis not present

## 2014-07-03 NOTE — Progress Notes (Signed)
Physical Therapy Treatment Patient Details  Name: Kelsey Ruiz MRN: [id number] Date of Birth: Oct 22, 1929  Today's Date: 07/03/2014 Time: 1015-1100 PT Time Calculation (min): 45 min    Charges: TherEx 1015-1050, Manual therapy 1050-1100 Visit#: 4 of 16  Re-eval: 07/21/14 Assessment Diagnosis: lowback hip pain secondary to piriformis syndrome Prior Therapy: no  Authorization: [employer] - Medicare  Authorization Visit#:  4 of   16  Subjective: Symptoms/Limitations Symptoms: patient states she has been feeling much better, so much so that she went to church for the first time in 2 months. Patient states doctor sent her MRI and that she feels more steady with the walker when walking now.  Pain Assessment Currently in Pain?: Yes Pain Score: 6  Pain Location: Leg Pain Orientation: Right Pain Type: Chronic pain Pain Onset: 1 to 4 weeks ago Pain Frequency: Intermittent Pain Relieving Factors: Ice and laying down  Exercise/Treatments Stretches Active Hamstring Stretch: 3 reps;30 seconds;Limitations Active Hamstring Stretch Limitations: 3 directions standing on 14in box Hip Flexor Stretch: 3 reps;20 seconds;Limitations Hip Flexor Stretch Limitations: on 14 in box ITB Stretch: 3 reps;20 seconds;Limitations ITB Stretch Limitations: standing between 2 walls Piriformis Stretch: 4 reps;20 seconds;Limitations Piriformis Stretch Limitations: seated 3 directions  Aerobic Stationary Bike: Nustep 35min Lvl 1 Standing Other Standing Lumbar Exercises: 3way gastroc stretch on slant board 3x 30" Other Standing Lumbar Exercises: 3D hip excursion 10x Seated Other Seated Lumbar Exercises: 3D thoracic excursion with red therapeutic ball movement    Manual therapy: soft tissue mobilization of bilateral piriformis muscles.   Physical Therapy Assessment and Plan PT Assessment and Plan Clinical Impression Statement: Pt's overall hip mobility is improving, increased ease with crossing legs for  piriformis stretches and increased ease with sit to stand today. Patient continues to demonstrate limited hamstring, glut/piriformis and gastro mobility though it is improving as seen in patient's improved gait. Patient noted a history of Rt lateral gastroc cramping for which she noted improvement following lateral calf stretching. PT Plan: Continue Initial focus on increasesing piriformis, hip flexor, and gastroc flexibitly . AIAnitiate low level glut med/max strengthening next session with side lunges to 14" box progressing to 8" box if pain free.     Goals PT Short Term Goals PT Short Term Goal 1: Patient will demosnstrate increased hip internal rotaion mobility of 40 degrees indicatign improved deceleration of landing durign gait PT Short Term Goal 1 - Progress: Progressing toward goal PT Short Term Goal 2: Patient will increase hip extension to >10 degrees to increase stride length PT Short Term Goal 2 - Progress: Progressing toward goal PT Short Term Goal 3: Patient will demosntrate increased ankle dorsiflexion of 18 degrees to decrease early heel off during gait PT Short Term Goal 3 - Progress: Progressing toward goal PT Short Term Goal 4: Patient will demonstrates a negative pirfimornis mobility test with pain <2/10 indicatign improved ability to sit with pain <2/10 and improved ability to cross her Rt ankkle over her Lt knee while sitting PT Short Term Goal 4 - Progress: Progressing toward goal  Problem List Patient Active Problem List   Diagnosis Date Noted  . Abnormality of gait 06/21/2014  . Pain in joint, pelvic region and thigh 06/21/2014  . Stiffness of joint, not elsewhere classified, pelvic region and thigh 06/21/2014    General Behavior During Therapy: [facility] for tasks assessed/performed  GP    Kelsey Ruiz R 07/03/2014, 11:49 AM

## 2014-07-05 ENCOUNTER — Ambulatory Visit (HOSPITAL_COMMUNITY)
Admission: RE | Admit: 2014-07-05 | Discharge: 2014-07-05 | Disposition: A | Discharge status: Home / Self Care | Payer: Medicare Other | Source: Ambulatory Visit | Attending: Physical Therapy | Admitting: Physical Therapy

## 2014-07-05 DIAGNOSIS — IMO0001 Reserved for inherently not codable concepts without codable children: Secondary | ICD-10-CM | POA: Diagnosis not present

## 2014-07-05 NOTE — Progress Notes (Signed)
Physical Therapy Treatment Patient Details  Name: Kelsey Ruiz MRN: [id number] Date of Birth: 1929-11-03  Today's Date: 07/05/2014 Time: 9702-6378 PT Time Calculation (min): 45 min   Charges: TherEx [id number], Manual Therapy 1715-1730 Visit#: 5 of 16  Re-eval: 07/21/14 Assessment Diagnosis: lowback hip pain secondary to piriformis syndrome Prior Therapy: no  Authorization: [employer] - Medicare  Authorization Time Period:    Authorization Visit#: 5 of 16   Subjective: Symptoms/Limitations Symptoms: patient states she has been feeling much better,  "I feel i am walking better, with less pain and able to walk further now that my hip is feeling better.  Pain Assessment Currently in Pain?: Yes Pain Score: 3  Pain Location: Leg Pain Orientation: Right Pain Type: Chronic pain Pain Radiating Towards: foot, Rt leg, up to back  Exercise/Treatments Stretches Active Hamstring Stretch: 3 reps;30 seconds;Limitations Active Hamstring Stretch Limitations: 3 directions standing on 14in box Hip Flexor Stretch: 3 reps;20 seconds;Limitations Hip Flexor Stretch Limitations: on 14 in box 3ay ITB Stretch: 3 reps;20 seconds;Limitations ITB Stretch Limitations: standing between 2 walls Piriformis Stretch: 4 reps;20 seconds;Limitations Piriformis Stretch Limitations: seated 3 directions  Standing Other Standing Lumbar Exercises: 2 way groin stretch3x 20seconds Other Standing Lumbar Exercises: 3D hip excursion (not sagittal plane(tobe added back next session)) 10x Seated Other Seated Lumbar Exercises: 3D thoracic excursion with red therapeutic ball movement Supine Bridge: 3 seconds;5 reps  Manual Therapy Manual Therapy: Other (comment) Other Manual Therapy: soft tissue mobilization of piriformis and manuaql piriformis stretch  Physical Therapy Assessment and Plan PT Assessment and Plan Clinical Impression Statement: Pt's overall hip mobility is improving, increased ease with crossing legs for  piriformis stretches and increased ease with sit to stand today. Patient continues to demonstrate limited hamstring, glut/piriformis and gastroc mobility though it is improving as seen in patient's improved gait. Patient was able to perform 5x bridging without pain this session for which she was unable to perform at all last session, though she noted increased pain following 7 repetitions. Strengthening exercises will continue to be added cautiously with primary focus on restoring mobility and gait first. .  PT Plan: Continue Initial focus on increasesing piriformis, hip flexor, and gastroc flexibitly . Conitnue low level glut med/max strengthening next session with side lunges to 14" box progressing down to to 8" box if pain free.     Goals PT Short Term Goals PT Short Term Goal 1: Patient will demosnstrate increased hip internal rotaion mobility of 40 degrees indicatign improved deceleration of landing durign gait PT Short Term Goal 1 - Progress: Progressing toward goal PT Short Term Goal 2: Patient will increase hip extension to >10 degrees to increase stride length PT Short Term Goal 2 - Progress: Progressing toward goal PT Short Term Goal 3: Patient will demosntrate increased ankle dorsiflexion of 18 degrees to decrease early heel off during gait PT Short Term Goal 3 - Progress: Progressing toward goal PT Short Term Goal 4: Patient will demonstrates a negative pirfimornis mobility test with pain <2/10 indicatign improved ability to sit with pain <2/10 and improved ability to cross her Rt ankkle over her Lt knee while sitting PT Short Term Goal 4 - Progress: Progressing toward goal PT Long Term Goals PT Long Term Goal 1: Patient will demsontrate hip extension strength of 4/5 to be able to sit from the chair 5x in <15seconds indicating patient not at high risk of falls PT Long Term Goal 1 - Progress: Progressing toward goal PT Long Term Goal 2: Patient will  be able to walk 56minutes withtou and  assitive device with pain <2/10 PT Long Term Goal 2 - Progress: Progressing toward goal Long Term Goal 3: Patient will demonstrate a TUG of <15 seconds indicatign patient is able to quickly stand up from a chair and walk without a risk of falling.  Long Term Goal 3 Progress: Progressing toward goal Long Term Goal 4: Patient will demonstrate hip abduction strength of 4/5 with MMT indicatign patient is able to stand one leg >10 seconds.  Long Term Goal 4 Progress: Progressing toward goal  Problem List Patient Active Problem List   Diagnosis Date Noted  . Abnormality of gait 06/21/2014  . Pain in joint, pelvic region and thigh 06/21/2014  . Stiffness of joint, not elsewhere classified, pelvic region and thigh 06/21/2014    PT - End of Session Activity Tolerance: Patient tolerated treatment well General Behavior During Therapy: [facility] for tasks assessed/performed  GP    Kelsey Ruiz 07/05/2014, 5:40 PM

## 2014-07-07 ENCOUNTER — Encounter (HOSPITAL_COMMUNITY): Payer: Self-pay | Admitting: Emergency Medicine

## 2014-07-07 ENCOUNTER — Emergency Department (HOSPITAL_COMMUNITY)
Admission: EM | Admit: 2014-07-07 | Discharge: 2014-07-07 | Disposition: A | Discharge status: Home / Self Care | Payer: Medicare Other | Attending: Emergency Medicine | Admitting: Emergency Medicine

## 2014-07-07 DIAGNOSIS — N3000 Acute cystitis without hematuria: Secondary | ICD-10-CM | POA: Diagnosis not present

## 2014-07-07 DIAGNOSIS — E785 Hyperlipidemia, unspecified: Secondary | ICD-10-CM | POA: Insufficient documentation

## 2014-07-07 DIAGNOSIS — I10 Essential (primary) hypertension: Secondary | ICD-10-CM | POA: Diagnosis not present

## 2014-07-07 DIAGNOSIS — R300 Dysuria: Secondary | ICD-10-CM | POA: Diagnosis present

## 2014-07-07 DIAGNOSIS — K219 Gastro-esophageal reflux disease without esophagitis: Secondary | ICD-10-CM | POA: Insufficient documentation

## 2014-07-07 DIAGNOSIS — Z7982 Long term (current) use of aspirin: Secondary | ICD-10-CM | POA: Diagnosis not present

## 2014-07-07 DIAGNOSIS — N3001 Acute cystitis with hematuria: Secondary | ICD-10-CM

## 2014-07-07 DIAGNOSIS — E039 Hypothyroidism, unspecified: Secondary | ICD-10-CM | POA: Diagnosis not present

## 2014-07-07 DIAGNOSIS — H409 Unspecified glaucoma: Secondary | ICD-10-CM | POA: Diagnosis not present

## 2014-07-07 DIAGNOSIS — Z87891 Personal history of nicotine dependence: Secondary | ICD-10-CM | POA: Insufficient documentation

## 2014-07-07 DIAGNOSIS — Z79899 Other long term (current) drug therapy: Secondary | ICD-10-CM | POA: Diagnosis not present

## 2014-07-07 DIAGNOSIS — M129 Arthropathy, unspecified: Secondary | ICD-10-CM | POA: Insufficient documentation

## 2014-07-07 LAB — URINALYSIS, ROUTINE W REFLEX MICROSCOPIC
BILIRUBIN URINE: NEGATIVE
Glucose, UA: NEGATIVE mg/dL
KETONES UR: NEGATIVE mg/dL
NITRITE: NEGATIVE
Protein, ur: 100 mg/dL — AB
Specific Gravity, Urine: 1.01 (ref 1.005–1.030)
UROBILINOGEN UA: 0.2 mg/dL (ref 0.0–1.0)
pH: 7.5 (ref 5.0–8.0)

## 2014-07-07 LAB — URINE MICROSCOPIC-ADD ON

## 2014-07-07 MED ORDER — PHENAZOPYRIDINE HCL 100 MG PO TABS
200.0000 mg | ORAL_TABLET | Freq: Once | ORAL | Status: AC
  Administered 2014-07-07: 200 mg via ORAL
  Filled 2014-07-07: qty 2

## 2014-07-07 MED ORDER — SULFAMETHOXAZOLE-TMP DS 800-160 MG PO TABS
1.0000 | ORAL_TABLET | Freq: Once | ORAL | Status: AC
  Administered 2014-07-07: 1 via ORAL
  Filled 2014-07-07: qty 1

## 2014-07-07 MED ORDER — PHENAZOPYRIDINE HCL 200 MG PO TABS: 200.0000 mg | ORAL_TABLET | Freq: Three times a day (TID) | ORAL | Status: DC | PRN

## 2014-07-07 MED ORDER — SULFAMETHOXAZOLE-TMP DS 800-160 MG PO TABS: 1.0000 | ORAL_TABLET | Freq: Two times a day (BID) | ORAL | Status: DC

## 2014-07-07 MED ORDER — SULFAMETHOXAZOLE-TRIMETHOPRIM 800-160 MG PO TABS: 1.0000 | ORAL_TABLET | Freq: Two times a day (BID) | ORAL | Status: DC

## 2014-07-07 NOTE — ED Notes (Signed)
Having painful urination with burning and stinging with blood noticed this am.

## 2014-07-07 NOTE — Discharge Instructions (Signed)
Drink plenty of fluids. Take the antibiotics as directed. The urine culture results should be available on Monday or Tuesday, July 17 or 28th. Your doctors office can check on those results.  Return if you get a fever, vomiting, you are unable to urinate, pain in your flank or you feel worse.    Urinary Tract Infection Urinary tract infections (UTIs) can develop anywhere along your urinary tract. Your urinary tract is your body's drainage system for removing wastes and extra water. Your urinary tract includes two kidneys, two ureters, a bladder, and a urethra. Your kidneys are a pair of bean-shaped organs. Each kidney is about the size of your fist. They are located below your ribs, one on each side of your spine. CAUSES Infections are caused by microbes, which are microscopic organisms, including fungi, viruses, and bacteria. These organisms are so small that they can only be seen through a microscope. Bacteria are the microbes that most commonly cause UTIs. SYMPTOMS  Symptoms of UTIs may vary by age and gender of the patient and by the location of the infection. Symptoms in young women typically include a frequent and intense urge to urinate and a painful, burning feeling in the bladder or urethra during urination. Older women and men are more likely to be tired, shaky, and weak and have muscle aches and abdominal pain. A fever may mean the infection is in your kidneys. Other symptoms of a kidney infection include pain in your back or sides below the ribs, nausea, and vomiting. DIAGNOSIS To diagnose a UTI, your caregiver will ask you about your symptoms. Your caregiver also will ask to provide a urine sample. The urine sample will be tested for bacteria and white blood cells. White blood cells are made by your body to help fight infection. TREATMENT  Typically, UTIs can be treated with medication. Because most UTIs are caused by a bacterial infection, they usually can be treated with the use of  antibiotics. The choice of antibiotic and length of treatment depend on your symptoms and the type of bacteria causing your infection. HOME CARE INSTRUCTIONS  If you were prescribed antibiotics, take them exactly as your caregiver instructs you. Finish the medication even if you feel better after you have only taken some of the medication.  Drink enough water and fluids to keep your urine clear or pale yellow.  Avoid caffeine, tea, and carbonated beverages. They tend to irritate your bladder.  Empty your bladder often. Avoid holding urine for long periods of time.  Empty your bladder before and after sexual intercourse.  After a bowel movement, women should cleanse from front to back. Use each tissue only once. SEEK MEDICAL CARE IF:   You have back pain.  You develop a fever.  Your symptoms do not begin to resolve within 3 days. SEEK IMMEDIATE MEDICAL CARE IF:   You have severe back pain or lower abdominal pain.  You develop chills.  You have nausea or vomiting.  You have continued burning or discomfort with urination. MAKE SURE YOU:   Understand these instructions.  Will watch your condition.  Will get help right away if you are not doing well or get worse. Document Released: 09/09/2005 Document Revised: 05/31/2012 Document Reviewed: 01/08/2012 [facility] Patient Information 2015 [place], [place]. This information is not intended to replace advice given to you by your health care provider. Make sure you discuss any questions you have with your health care provider.

## 2014-07-07 NOTE — ED Provider Notes (Signed)
CSN: [id number]     Arrival date & time 07/07/14  1112 History   This chart was scribed for Janice Norrie, MD by Martinique Peace, ED Scribe. The patient was seen in [place]. The patient's care was started at 12:26 PM.    Chief Complaint  Patient presents with  . Urinary Tract Infection      Patient is a 80 y.o. female presenting with urinary tract infection. The history is provided by the patient and a relative. No language interpreter was used.  Urinary Tract Infection   HPI Comments: Kelsey Ruiz is a 80 y.o. female who presents to the Emergency Department complaining of dysuria and hematuria accompanied by a burning sensation on urination and frequency onset this morning about 9 am. She has had moderate pain in her right leg that she is being evaluated for by Dr Percell Miller. She has had a MRI and steroid injections and is now in PT.  She denies any recent urinary catheterizations. She does not have a history of frequent UTIs. Pt reports history of kidney stones in the past but states it was 2 or 3 decades again. She denies any fever or chills.   Pt reports history of smoking but states she quit in 1985 (0.5 packs daily for 10 years).  PCP Dr Hilma Favors Orthopedist Dr Percell Miller  Past Medical History  Diagnosis Date  . Hypertension   . Glaucoma   . GERD (gastroesophageal reflux disease)   . Hypothyroidism   . Hyperlipemia   . Arthritis    Past Surgical History  Procedure Laterality Date  . Abdominal hysterectomy    . Laser eyes Bilateral     for glaucoma  . Thyroidectomy    . Breast surgery Bilateral     benign cysts  . Breast biopsy Right 04/11/2014    Procedure: RIGHT BREAST BIOPSY AFTER NEEDLE LOCALIZATION;  Surgeon: Jamesetta So, MD;  Location: [facility];  Service: General;  Laterality: Right;  NEEDLE LOC @ 8:00   No family history on file. History  Substance Use Topics  . Smoking status: Former Smoker -- 0.50 packs/day for 10 years    Types: Cigarettes    Quit date:  04/05/1984  . Smokeless tobacco: Not on file  . Alcohol Use: No   Using a walker b/o her right hip pain  OB History   Grav Para Term Preterm Abortions TAB SAB Ect Mult Living                 Review of Systems  Genitourinary: Positive for dysuria and hematuria.  Musculoskeletal:       Positive for leg pain.   All other systems reviewed and are negative.     Allergies  Review of patient's allergies indicates no known allergies.  Home Medications   Prior to Admission medications   Medication Sig Start Date End Date Taking? Authorizing Provider  alendronate (FOSAMAX) 70 MG tablet Take 70 mg by mouth once a week. Take with a full glass of water on an empty stomach. Take on Saturday.   Yes Historical Provider, MD  aspirin EC 325 MG tablet Take 325 mg by mouth every morning.   Yes Historical Provider, MD  atorvastatin (LIPITOR) 40 MG tablet Take 40 mg by mouth every evening.    Yes Historical Provider, MD  Calcium-Magnesium-Zinc (CAL-MAG-ZINC PO) Take 1 tablet by mouth every morning.    Yes Historical Provider, MD  cholecalciferol (VITAMIN D) 1000 UNITS tablet Take 1,000 Units by mouth every morning.  Yes Historical Provider, MD  Coenzyme Q10 200 MG capsule Take 200 mg by mouth every morning.    Yes Historical Provider, MD  HYDROcodone-acetaminophen (NORCO/VICODIN) 5-325 MG per tablet Take 0.5-1 tablets by mouth every 6 (six) hours as needed for moderate pain.    Yes Historical Provider, MD  levothyroxine (SYNTHROID, LEVOTHROID) 75 MCG tablet Take 75 mcg by mouth daily before breakfast.   Yes Historical Provider, MD  meloxicam (MOBIC) 15 MG tablet Take 15 mg by mouth daily.   Yes Historical Provider, MD  omeprazole (PRILOSEC) 40 MG capsule Take 40 mg by mouth every morning.    Yes Historical Provider, MD  traMADol (ULTRAM) 50 MG tablet Take 50 mg by mouth 3 (three) times daily as needed. For pan 05/08/14  Yes Historical Provider, MD  vitamin B-12 (CYANOCOBALAMIN) 100 MCG tablet Take  100 mcg by mouth every morning.    Yes Historical Provider, MD  vitamin C (ASCORBIC ACID) 500 MG tablet Take 500 mg by mouth every morning.    Yes Historical Provider, MD   BP 161/81  Pulse 102  Temp(Src) 99.1 F (37.3 C) (Oral)  Resp 18  SpO2 96%  Vital signs normal except for hypertension and tachycardia  Physical Exam  Nursing note and vitals reviewed. Constitutional: She is oriented to person, place, and time. She appears well-developed and well-nourished.  Non-toxic appearance. She does not appear ill. No distress.  HENT:  Head: Normocephalic and atraumatic.  Right Ear: External ear normal.  Left Ear: External ear normal.  Nose: Nose normal. No mucosal edema or rhinorrhea.  Mouth/Throat: Oropharynx is clear and moist and mucous membranes are normal. No dental abscesses or uvula swelling.  Eyes: Conjunctivae and EOM are normal. Pupils are equal, round, and reactive to light.  Neck: Normal range of motion and full passive range of motion without pain. Neck supple.  Cardiovascular: Normal rate, regular rhythm and normal heart sounds.  Exam reveals no gallop and no friction rub.   No murmur heard. Pulmonary/Chest: Effort normal and breath sounds normal. No respiratory distress. She has no wheezes. She has no rhonchi. She has no rales. She exhibits no tenderness and no crepitus.  Abdominal: Soft. Normal appearance and bowel sounds are normal. She exhibits no distension. There is tenderness in the suprapubic area. There is no rebound and no guarding.    Mild tenderness over bladder. No CVA tenderness.   Musculoskeletal: Normal range of motion. She exhibits no edema and no tenderness.  Moves all extremities well.   Neurological: She is alert and oriented to person, place, and time. She has normal strength. No cranial nerve deficit.  Skin: Skin is warm, dry and intact. No rash noted. No erythema. No pallor.  Psychiatric: She has a normal mood and affect. Her speech is normal and  behavior is normal. Her mood appears not anxious.    ED Course  Procedures (including critical care time)  Medications  sulfamethoxazole-trimethoprim (BACTRIM DS) 800-160 MG per tablet 1 tablet (not administered)  phenazopyridine (PYRIDIUM) tablet 200 mg (not administered)     DIAGNOSTIC STUDIES: Oxygen Saturation is 96% on room air, adequate by my interpretation.    COORDINATION OF CARE: 12:33 PM- Treatment plan was discussed with patient who verbalizes understanding and agrees.   Review of prior chart shows she has no prior urine culture patient was started on Septra. Her physician can check her urine culture next week.  Labs Review Results for orders placed during the hospital encounter of 07/07/14  URINALYSIS, ROUTINE  W REFLEX MICROSCOPIC      Result Value Ref Range   Color, Urine AMBER (*) YELLOW   APPearance CLOUDY (*) CLEAR   Specific Gravity, Urine 1.010  1.005 - 1.030   pH 7.5  5.0 - 8.0   Glucose, UA NEGATIVE  NEGATIVE mg/dL   Hgb urine dipstick LARGE (*) NEGATIVE   Bilirubin Urine NEGATIVE  NEGATIVE   Ketones, ur NEGATIVE  NEGATIVE mg/dL   Protein, ur 100 (*) NEGATIVE mg/dL   Urobilinogen, UA 0.2  0.0 - 1.0 mg/dL   Nitrite NEGATIVE  NEGATIVE   Leukocytes, UA LARGE (*) NEGATIVE  URINE MICROSCOPIC-ADD ON      Result Value Ref Range   WBC, UA TOO NUMEROUS TO COUNT  <3 WBC/hpf   RBC / HPF TOO NUMEROUS TO COUNT  <3 RBC/hpf   Bacteria, UA MANY (*) RARE    Laboratory interpretation all normal except UTI   Imaging Review No results found.   EKG Interpretation None      MDM   Final diagnoses:  Acute cystitis with hematuria    Discharge Medication List as of 07/07/2014 12:55 PM    START taking these medications   Details  phenazopyridine (PYRIDIUM) 200 MG tablet Take 1 tablet (200 mg total) by mouth 3 (three) times daily as needed (pain on urination)., Starting 07/07/2014, Until Discontinued, Print    sulfamethoxazole-trimethoprim (BACTRIM DS) 800-160  MG per tablet Take 1 tablet by mouth 2 (two) times daily., Starting 07/07/2014, Until Discontinued, Print       Plan discharge    I personally performed the services described in this documentation, which was scribed in my presence. The recorded information has been reviewed and considered.  Rolland Porter, MD, FACEP    Janice Norrie, MD 07/07/14 [phone]

## 2014-07-10 ENCOUNTER — Ambulatory Visit (HOSPITAL_COMMUNITY): Payer: Medicare Other | Admitting: Physical Therapy

## 2014-07-10 LAB — URINE CULTURE: Colony Count: >=100000

## 2014-07-11 ENCOUNTER — Telehealth (HOSPITAL_BASED_OUTPATIENT_CLINIC_OR_DEPARTMENT_OTHER): Payer: Self-pay

## 2014-07-11 NOTE — Telephone Encounter (Signed)
Post ED Visit - Positive Culture Follow-up: Successful Patient Follow-Up  Culture assessed and recommendations reviewed by: []  Wes [place], Pharm.D., BCPS [x]  Heide Guile, Pharm.D., BCPS []  Alycia Rossetti, Pharm.D., BCPS []  [place], Pharm.D., BCPS, AAHIVP []  Legrand Como, Pharm.D., BCPS, AAHIVP  Positive Urine culture, >/= 100,000 colonies -> E Coli  []  Patient discharged without antimicrobial prescription and treatment is now indicated [x]  Organism is resistant to prescribed ED discharge antimicrobial, STOP Bactrim []  Patient with positive blood cultures  Changes discussed with ED provider: Alvera Singh PA New antibiotic prescription "Keflex 500 mg po TID x 7 days" Called to N/A  Contacted patient, date 07/11/14, time 17:37   Dortha Kern 07/11/2014, 5:39 PM

## 2014-07-11 NOTE — Progress Notes (Signed)
ED Antimicrobial Stewardship Positive Culture Follow Up   Kelsey Ruiz is an 80 y.o. female who presented to [facility] on 07/07/2014 with a chief complaint of  Chief Complaint  Patient presents with  . Urinary Tract Infection    Recent Results (from the past 720 hour(s))  URINE CULTURE     Status: None   Collection Time    07/07/14 11:29 AM      Result Value Ref Range Status   Specimen Description URINE, CLEAN CATCH   Final   Special Requests NONE   Final   Culture  Setup Time     Final   Value: 07/07/2014 21:21     Performed at [facility]     Final   Value: >=100,000 COLONIES/ML     Performed at [facility]   Culture     Final   Value: ESCHERICHIA COLI     Performed at [facility]   Report Status 07/10/2014 FINAL   Final   Organism ID, Bacteria ESCHERICHIA COLI   Final    [x]  Treated with Bactrim, organism resistant to prescribed antimicrobial []  Patient discharged originally without antimicrobial agent and treatment is now indicated  New antibiotic prescription: cephalexin 500mg  PO TID x 7 days  ED Provider: Carlisle Cater, PA-C   [name] 07/11/2014, 10:08 AM Infectious Diseases Pharmacist Phone# [phone]

## 2014-07-12 ENCOUNTER — Ambulatory Visit (HOSPITAL_COMMUNITY): Payer: Medicare Other | Admitting: Physical Therapy

## 2014-07-17 ENCOUNTER — Ambulatory Visit (HOSPITAL_COMMUNITY)
Admission: RE | Admit: 2014-07-17 | Discharge: 2014-07-17 | Disposition: A | Discharge status: Home / Self Care | Payer: Medicare Other | Source: Ambulatory Visit | Attending: Orthopedic Surgery | Admitting: Orthopedic Surgery

## 2014-07-17 DIAGNOSIS — IMO0001 Reserved for inherently not codable concepts without codable children: Secondary | ICD-10-CM | POA: Insufficient documentation

## 2014-07-17 DIAGNOSIS — R29898 Other symptoms and signs involving the musculoskeletal system: Secondary | ICD-10-CM | POA: Diagnosis not present

## 2014-07-17 DIAGNOSIS — M25559 Pain in unspecified hip: Secondary | ICD-10-CM | POA: Insufficient documentation

## 2014-07-17 DIAGNOSIS — R269 Unspecified abnormalities of gait and mobility: Secondary | ICD-10-CM | POA: Insufficient documentation

## 2014-07-17 DIAGNOSIS — E785 Hyperlipidemia, unspecified: Secondary | ICD-10-CM | POA: Insufficient documentation

## 2014-07-17 DIAGNOSIS — I10 Essential (primary) hypertension: Secondary | ICD-10-CM | POA: Insufficient documentation

## 2014-07-17 DIAGNOSIS — M25659 Stiffness of unspecified hip, not elsewhere classified: Secondary | ICD-10-CM | POA: Diagnosis not present

## 2014-07-17 NOTE — Progress Notes (Addendum)
Physical Therapy Treatment Patient Details  Name: Kelsey Ruiz MRN: [id number] Date of Birth: 18-Aug-1929  Today's Date: 07/17/2014 Time: 1520-1558 PT Time Calculation (min): 38 min Charge: TE 1520-1540, Manual [id number]   Visit#: 6 of 16  Re-eval: 07/21/14 Assessment Diagnosis: lowback hip pain secondary to piriformis syndrome Prior Therapy: no  Authorization: [employer] - Medicare  Authorization Time Period:    Authorization Visit#: 6 of 16   Subjective: Symptoms/Limitations Symptoms: Pt stated she had bladder infection and increased Rt hip and thigh pain scale 8/10 Pain Assessment Currently in Pain?: Yes Pain Score: 8  Pain Location: Hip Pain Orientation: Right  Objective:   Exercise/Treatments Stretches Active Hamstring Stretch: 3 reps;30 seconds;Limitations Active Hamstring Stretch Limitations: 3 directions standing on 14in box Hip Flexor Stretch: 3 reps;20 seconds;Limitations Hip Flexor Stretch Limitations: on 14 in box 3ay ITB Stretch: 3 reps;20 seconds;Limitations ITB Stretch Limitations: standing between 2 walls Piriformis Stretch: 4 reps;30 seconds;Limitations Piriformis Stretch Limitations: seated 3 directions  Standing Other Standing Lumbar Exercises: weight shifting 10reps  Manual Therapy Manual Therapy: Other (comment) Other Manual Therapy: Pt Lt sidelying with pillolw between knees, STM to piriformis, TFL and IT Band  Physical Therapy Assessment and Plan PT Assessment and Plan Clinical Impression Statement: Pt limited by pain and increased weakness following bladder infection.  Session focus on improving hip mobility and manual technqiues to reduce fascial restrictions Rt piriformis, TFL and ITBand region.  Pt with improved gait mechanics and reports pain reduced at end of session.  Pt encouraged to stay hydrated following sickness and manual.   PT Plan: Re-eval next session.  Continue with stretches to improve flexibilty and continue with gluteal  strengthening as tolerated.      Goals PT Short Term Goals PT Short Term Goal 1: Patient will demosnstrate increased hip internal rotaion mobility of 40 degrees indicatign improved deceleration of landing durign gait PT Short Term Goal 1 - Progress: Progressing toward goal PT Short Term Goal 2: Patient will increase hip extension to >10 degrees to increase stride length PT Short Term Goal 2 - Progress: Progressing toward goal PT Short Term Goal 3: Patient will demosntrate increased ankle dorsiflexion of 18 degrees to decrease early heel off during gait PT Short Term Goal 4: Patient will demonstrates a negative pirfimornis mobility test with pain <2/10 indicatign improved ability to sit with pain <2/10 and improved ability to cross her Rt ankkle over her Lt knee while sitting PT Short Term Goal 4 - Progress: Progressing toward goal PT Long Term Goals PT Long Term Goal 1: Patient will demsontrate hip extension strength of 4/5 to be able to sit from the chair 5x in <15seconds indicating patient not at high risk of falls PT Long Term Goal 2: Patient will be able to walk 40minutes withtou and assitive device with pain <2/10 Long Term Goal 3: Patient will demonstrate a TUG of <15 seconds indicatign patient is able to quickly stand up from a chair and walk without a risk of falling.  Long Term Goal 4: Patient will demonstrate hip abduction strength of 4/5 with MMT indicatign patient is able to stand one leg >10 seconds.   Problem List Patient Active Problem List   Diagnosis Date Noted  . Abnormality of gait 06/21/2014  . Pain in joint, pelvic region and thigh 06/21/2014  . Stiffness of joint, not elsewhere classified, pelvic region and thigh 06/21/2014    PT - End of Session Activity Tolerance: Patient tolerated treatment well;Patient limited by pain General Behavior During  Therapy: WFL for tasks assessed/performed  GP    Aldona Lento 07/17/2014, 4:05 PM

## 2014-07-19 ENCOUNTER — Ambulatory Visit (HOSPITAL_COMMUNITY): Payer: Medicare Other | Admitting: Physical Therapy

## 2014-07-24 ENCOUNTER — Ambulatory Visit (HOSPITAL_COMMUNITY)
Admission: RE | Admit: 2014-07-24 | Discharge: 2014-07-24 | Disposition: A | Discharge status: Home / Self Care | Payer: Medicare Other | Source: Ambulatory Visit | Attending: Orthopedic Surgery | Admitting: Orthopedic Surgery

## 2014-07-24 DIAGNOSIS — IMO0001 Reserved for inherently not codable concepts without codable children: Secondary | ICD-10-CM | POA: Diagnosis not present

## 2014-07-24 NOTE — Progress Notes (Signed)
Physical Therapy Treatment Patient Details  Name: Kelsey Ruiz MRN: [id number] Date of Birth: Dec 18, 1928  Today's Date: 07/24/2014 Time: 8099-8338 PT Time Calculation (min): 44 min Charges: Manual [id number], TherEx 2505-3976 Visit#: 7 of 16  Re-eval: 07/30/14 Assessment Diagnosis: lowback hip pain secondary to piriformis syndrome Prior Therapy: no  Authorization: [employer] - Medicare  Authorization Time Period:    Authorization Visit#: 7 of 16   Subjective: Symptoms/Limitations Symptoms: Patient states frustration ith progress secondary to pain in Rt hip attributed to ITband syndrome tht developed following inactivity due to urinary tract infection.  Pain Assessment Currently in Pain?: Yes Pain Score: 7  Pain Location: Hip Pain Orientation: Right Pain Type: Chronic pain Pain Radiating Towards: Rt thigh  Exercise/Treatments Stretches Active Hamstring Stretch: 3 reps;30 seconds;Limitations Active Hamstring Stretch Limitations: 3 directions standing on 14in box Hip Flexor Stretch: 3 reps;20 seconds;Limitations Hip Flexor Stretch Limitations: on 14 in box 3ay ITB Stretch: 3 reps;20 seconds;Limitations ITB Stretch Limitations: standing between 2 walls Piriformis Stretch: 4 reps;30 seconds;Limitations Piriformis Stretch Limitations: seated 3 directions   Manual Therapy Other Manual Therapy: Pt Lt sidelying with pillolw between knees, STM to piriformis, TFL and IT Band  Physical Therapy Assessment and Plan PT Assessment and Plan Clinical Impression Statement: Pt limited by pain and increased weakness continue from recent bladder infection. Session focus on improving hip mobility and manual techniques to reduce fascial restrictions Rt piriformis, TFL and ITBand region. Pt with improved gait mechanics and reports pain reduced at end of session. Pt encouraged to stay hydrated following sickness and manual. Reassessment delayed due to high pain and inability to perform testing.  Patient was advised that if pain does not improve with therapy to return to MD.  PT Plan: Re-eval next session.  Continue with stretches to improve flexibilty and continue with gluteal strengthening as tolerated.      Goals PT Short Term Goals PT Short Term Goal 1: Patient will demosnstrate increased hip internal rotaion mobility of 40 degrees indicatign improved deceleration of landing durign gait PT Short Term Goal 1 - Progress: Progressing toward goal PT Short Term Goal 2: Patient will increase hip extension to >10 degrees to increase stride length PT Short Term Goal 2 - Progress: Progressing toward goal PT Short Term Goal 3: Patient will demosntrate increased ankle dorsiflexion of 18 degrees to decrease early heel off during gait PT Short Term Goal 3 - Progress: Progressing toward goal PT Short Term Goal 4: Patient will demonstrates a negative pirfimornis mobility test with pain <2/10 indicatign improved ability to sit with pain <2/10 and improved ability to cross her Rt ankkle over her Lt knee while sitting PT Short Term Goal 4 - Progress: Progressing toward goal PT Short Term Goal 5: Patient will display a negative obers test indicating adequate ITband mobility for gait.  PT Short Term Goal 5 - Progress: Progressing toward goal PT Long Term Goals PT Long Term Goal 1: Patient will demsontrate hip extension strength of 4/5 to be able to sit from the chair 5x in <15seconds indicating patient not at high risk of falls PT Long Term Goal 1 - Progress: Progressing toward goal PT Long Term Goal 2: Patient will be able to walk 37minutes withtou and assitive device with pain <2/10 PT Long Term Goal 2 - Progress: Progressing toward goal Long Term Goal 3: Patient will demonstrate a TUG of <15 seconds indicatign patient is able to quickly stand up from a chair and walk without a risk of falling.  Long  Term Goal 3 Progress: Progressing toward goal Long Term Goal 4: Patient will demonstrate hip  abduction strength of 4/5 with MMT indicatign patient is able to stand one leg >10 seconds.  Long Term Goal 4 Progress: Progressing toward goal  Problem List Patient Active Problem List   Diagnosis Date Noted  . Abnormality of gait 06/21/2014  . Pain in joint, pelvic region and thigh 06/21/2014  . Stiffness of joint, not elsewhere classified, pelvic region and thigh 06/21/2014    PT - End of Session Activity Tolerance: Patient tolerated treatment well;Patient limited by pain General Behavior During Therapy: [facility] for tasks assessed/performed PT Plan of Care PT Home Exercise Plan: Piriformis and ITBand stretch.   GP    Anndrea Mihelich R 07/24/2014, 7:05 PM

## 2014-07-26 ENCOUNTER — Ambulatory Visit (HOSPITAL_COMMUNITY)
Admission: RE | Admit: 2014-07-26 | Discharge: 2014-07-26 | Disposition: A | Discharge status: Home / Self Care | Payer: Medicare Other | Source: Ambulatory Visit | Attending: Family Medicine | Admitting: Family Medicine

## 2014-07-26 DIAGNOSIS — IMO0001 Reserved for inherently not codable concepts without codable children: Secondary | ICD-10-CM | POA: Diagnosis not present

## 2014-07-26 NOTE — Evaluation (Signed)
Physical Therapy Reassessment  Patient Details  Name: Kelsey Ruiz MRN: [id number] Date of Birth: 25-Sep-1929  Today's Date: 07/26/2014 Time: 1520-1600 PT Time Calculation (min): 40 min    Charges: 1520-1530 manual, TherEx 1530-1600          Visit#: 8 of  16  Re-eval: 08/25/14 Assessment Diagnosis: lowback hip pain secondary to piriformis syndrome Prior Therapy: no  Authorization: [employer] - Medicare     Authorization Visit#: 8 of 16   Past Medical History:  Past Medical History  Diagnosis Date  . Hypertension   . Glaucoma   . GERD (gastroesophageal reflux disease)   . Hypothyroidism   . Hyperlipemia   . Arthritis    Past Surgical History:  Past Surgical History  Procedure Laterality Date  . Abdominal hysterectomy    . Laser eyes Bilateral     for glaucoma  . Thyroidectomy    . Breast surgery Bilateral     benign cysts  . Breast biopsy Right 04/11/2014    Procedure: RIGHT BREAST BIOPSY AFTER NEEDLE LOCALIZATION;  Surgeon: Jamesetta So, MD;  Location: [facility];  Service: General;  Laterality: Right;  NEEDLE LOC @ 8:00    Subjective Symptoms/Limitations Symptoms: Patient notes being able to do more now than she used to be able to, but that her pain has been increased since her urinary tract infection. patient also notes continued relief with manual therapy and improvement follwoign each session.  "Now after therapy i caqn walk withtou the therapy." Pain Assessment Currently in Pain?: Yes Pain Score: 7  Pain Location: Hip Pain Orientation: Right Pain Type: Chronic pain Pain Radiating Towards: Rt thigh Pain Onset: 1 to 4 weeks ago Pain Frequency: Intermittent Pain Relieving Factors: Ice and laying down  Cognition/Observation Observation/Other Assessments Observations: no longer amubulates with forward.   Sensation/Coordination/Flexibility/Functional Tests Flexibility Thomas: Positive 90/90: Positive  Assessment RLE AROM (degrees) Right Hip Extension:  7 Right Hip Flexion: 120 Right Knee Extension: 0 Right Knee Flexion: 120 Right Ankle Dorsiflexion: 10 RLE Strength Right Hip Flexion: 3+/5 Right Hip Extension: 2+/5 Right Hip External Rotation : 50 Right Hip Internal Rotation : 16 Right Hip ABduction: 2+/5 Right Knee Flexion: 4/5 Right Knee Extension:  (4+/5) Right Ankle Dorsiflexion: 4/5  Exercise/Treatments   Stretches  Active Hamstring Stretch: 3 reps;30 seconds;Limitations  Active Hamstring Stretch Limitations: 3 directions standing on 14in box  Hip Flexor Stretch: 3 reps;20 seconds;Limitations  Hip Flexor Stretch Limitations: on 14 in box 3ay  ITB Stretch: 3 reps;20 seconds;Limitations  ITB Stretch Limitations: standing between 2 walls  Piriformis Stretch: 4 reps;30 seconds;Limitations  Piriformis Stretch Limitations: seated 3 directions  Standing  Other Standing Lumbar Exercises: weight shifting 10reps  Manual Therapy  Manual Therapy: Other (comment)  Other Manual Therapy: Pt Lt sidelying with pillolw between knees, STM to piriformis, TFL and IT Band  Physical Therapy Assessment and Plan PT Assessment and Plan Clinical Impression Statement: Patient is making steady progress towards all goals with improving strength and mobility with all measures. Patient continues to have pain, and progress has been limited secondary to contracting a urinary tract infection during term of care. patient will continue to benefit from skilled phsycial therapy to increased Rt hip and LE mobitlity and strength so patient can improve walkign without an assistive device and be able to >36minutes.  PT Plan:  Continue with stretches to improve flexibilty and continue with gluteal strengthening as tolerated to improve activity tolerance, reintiate Nustep to increase exercise olerance next session  Goals PT Short Term Goals PT Short Term Goal 1: Patient will demosnstrate increased hip internal rotaion mobility of 40 degrees indicatign improved  deceleration of landing durign gait PT Short Term Goal 1 - Progress: Progressing toward goal PT Short Term Goal 2: Patient will increase hip extension to >10 degrees to increase stride length PT Short Term Goal 2 - Progress: Progressing toward goal PT Short Term Goal 3: Patient will demosntrate increased ankle dorsiflexion of 18 degrees to decrease early heel off during gait PT Short Term Goal 3 - Progress: Progressing toward goal PT Short Term Goal 4: Patient will demonstrates a negative pirfimornis mobility test with pain <2/10 indicatign improved ability to sit with pain <2/10 and improved ability to cross her Rt ankkle over her Lt knee while sitting PT Short Term Goal 4 - Progress: Progressing toward goal PT Long Term Goals PT Long Term Goal 1: Patient will demsontrate hip extension strength of 4/5 to be able to sit from the chair 5x in <15seconds indicating patient not at high risk of falls PT Long Term Goal 1 - Progress: Progressing toward goal PT Long Term Goal 2: Patient will be able to walk 19minutes withtou and assitive device with pain <2/10 PT Long Term Goal 2 - Progress: Progressing toward goal Long Term Goal 3: Patient will demonstrate a TUG of <15 seconds indicatign patient is able to quickly stand up from a chair and walk without a risk of falling.  Long Term Goal 3 Progress: Progressing toward goal Long Term Goal 4: Patient will demonstrate hip abduction strength of 4/5 with MMT indicatign patient is able to stand one leg >10 seconds.  Long Term Goal 4 Progress: Progressing toward goal  Problem List Patient Active Problem List   Diagnosis Date Noted  . Abnormality of gait 06/21/2014  . Pain in joint, pelvic region and thigh 06/21/2014  . Stiffness of joint, not elsewhere classified, pelvic region and thigh 06/21/2014    PT Plan of Care PT Home Exercise Plan: Piriformis and ITBand stretch.   GP Functional Assessment Tool Used: FOTO 60% limited, was 82%  limited Functional Limitation: Mobility: Walking and moving around Mobility: Walking and Moving Around Current Status (W2993): At least 60 percent but less than 80 percent impaired, limited or restricted Mobility: Walking and Moving Around Goal Status [phone]): At least 40 percent but less than 60 percent impaired, limited or restricted  Leia Alf 07/26/2014, 4:20 PM  Physician Documentation Your signature is required to indicate approval of the treatment plan as stated above.  Please sign and either send electronically or make a copy of this report for your files and return this physician signed original.   Please mark one 1.__approve of plan  2. ___approve of plan with the following conditions.   ______________________________                                                          _____________________ Physician Signature  Date  

## 2014-08-01 ENCOUNTER — Ambulatory Visit (HOSPITAL_COMMUNITY)
Admission: RE | Admit: 2014-08-01 | Discharge: 2014-08-01 | Disposition: A | Discharge status: Home / Self Care | Payer: Medicare Other | Source: Ambulatory Visit | Attending: Orthopedic Surgery | Admitting: Orthopedic Surgery

## 2014-08-01 DIAGNOSIS — IMO0001 Reserved for inherently not codable concepts without codable children: Secondary | ICD-10-CM | POA: Diagnosis not present

## 2014-08-01 NOTE — Progress Notes (Signed)
Physical Therapy Treatment Patient Details  Name: Kelsey Ruiz MRN: [id number] Date of Birth: 12/29/1928  Today's Date: 08/01/2014 Time: 7530-0511 PT Time Calculation (min): 42 min  Visit#: 9 of 16  Re-eval: 08/25/14 Authorization: [employer] - Medicare G code updated on 8th visit  Authorization Visit#: 9 of 18  Charges:  therex 1438-1505 (27'), manual 1505-1520 (15')  Subjective:  Symptoms/Limitations Symptoms: Pt states her pain remains high in her Rt hip into the lateral thigh, especially with weight bearing.   Pain Assessment Currently in Pain?: Yes Pain Score: 7  Pain Location: Hip Pain Orientation: Right  Exercise/Treatments Stretches Active Hamstring Stretch: 3 reps;30 seconds;Limitations Active Hamstring Stretch Limitations: 3 directions standing on 14in box Hip Flexor Stretch: 3 reps;30 seconds Hip Flexor Stretch Limitations: on 14 in box Piriformis Stretch: 2 reps;30 seconds;Limitations Piriformis Stretch Limitations: seated 3 directions  Aerobic Stationary Bike: nustep level 3 UE/LE 8 minutes      Manual Therapy Other Manual Therapy: Pt Lt sidelying with pillolw between knees, STM to piriformis, TFL and IT Band  Physical Therapy Assessment and Plan PT Assessment:  PT with poor recall of exercises, continues to require demonstration/therapist facilitation to perform correctly.  Pain continues to be major limiting factor for progression.  Unable to wean AD per patient goal at this time due to high pain. Clinical Impression Statement: Pt Lt sidelying with pillolw between knees, STM to piriformis, TFL and IT Bandtaken  PT Plan:  Continue with stretches to improve flexibilty and continue with gluteal strengthening as tolerated to improve activity tolerance.  Attempt strengthening of Rt LE glut muscles.   Problem List Patient Active Problem List   Diagnosis Date Noted  . Abnormality of gait 06/21/2014  . Pain in joint, pelvic region and thigh 06/21/2014  .  Stiffness of joint, not elsewhere classified, pelvic region and thigh 06/21/2014       GP Functional Limitation: Mobility: Walking and moving around;Changing and maintaining body position  Teena Irani, PTA/CLT 08/01/2014, 3:34 PM

## 2014-08-03 ENCOUNTER — Ambulatory Visit (HOSPITAL_COMMUNITY)
Admission: RE | Admit: 2014-08-03 | Discharge: 2014-08-03 | Disposition: A | Discharge status: Home / Self Care | Payer: Medicare Other | Source: Ambulatory Visit | Attending: Orthopedic Surgery | Admitting: Orthopedic Surgery

## 2014-08-03 DIAGNOSIS — IMO0001 Reserved for inherently not codable concepts without codable children: Secondary | ICD-10-CM | POA: Diagnosis not present

## 2014-08-03 NOTE — Progress Notes (Signed)
Physical Therapy Treatment Patient Details  Name: Kelsey Ruiz MRN: [id number] Date of Birth: March 09, 1929  Today's Date: 08/03/2014 Time: 1520-1600 PT Time Calculation (min): 40 min   Charges: TherEx 1520-1550, Gait training 1550-1600 Visit#: 10 of 16  Re-eval: 08/25/14 Assessment Diagnosis: lowback hip pain secondary to piriformis syndrome Prior Therapy: no  Authorization: [employer] - Medicare G code updated on 8th visit  Authorization Visit#: 10 of 18   Subjective: Symptoms/Limitations Symptoms: Patient states that she continues to hav pain, though it is much improved, patient notes she continnues to feel "cruddy" and "sick" and she will be seeing her MD on Monday to determine if her being sick is related to someothing else medically.  Pain Assessment Currently in Pain?: Yes Pain Score: 5  Pain Location: Hip Pain Orientation: Right Pain Type: Chronic pain Pain Radiating Towards: Rt hip Pain Frequency: Intermittent  Exercise/Treatments Stretches Active Hamstring Stretch: 3 reps;30 seconds;Limitations Active Hamstring Stretch Limitations: 3 directions standing on 14in box Hip Flexor Stretch: 3 reps;30 seconds Hip Flexor Stretch Limitations: on 14 in box Piriformis Stretch: 2 reps;30 seconds;Limitations Piriformis Stretch Limitations: seated 3 directions  Standing Forward Lunge: 10 reps;Limitations Forward Lunge Limitations: to floor with functional manual reaction techniques to improve transition zone 1 and 2 mechnics of gait.  Other Standing Lumbar Exercises: Sumo with red T-band  Other Standing Lumbar Exercises: 3D hip excursion (not sagittal plane(tobe added back next session)) 10x  Manual Therapy Manual Therapy: Other (comment) Other Manual Therapy: Pt Lt sidelying with pillolw between knees, STM to piriformis, TFL and IT Band, Functonal manual reaction techniques to improve transitional zone 1 and 2 gait mechanics.   Physical Therapy Assessment and Plan PT Assessment  and Plan Clinical Impression Statement: patient continues to display weakness in Rt hip secondary to pain resulting in limited ability to load through glutes, follwoign introduction of groin stretch patient demosntrated improved frontal plane movemnt. Patient noted decreased pain at end of session. " I feel good".  PT Plan:  Continue with stretches to improve flexibilty and continue with gluteal strengthening as tolerated to improve activity tolerance, reintiate Nustep to increase exercise tolerance next session. continue with groin stretchign to improve gait.     Goals    Problem List Patient Active Problem List   Diagnosis Date Noted  . Abnormality of gait 06/21/2014  . Pain in joint, pelvic region and thigh 06/21/2014  . Stiffness of joint, not elsewhere classified, pelvic region and thigh 06/21/2014    PT - End of Session Activity Tolerance: Patient tolerated treatment well;Patient limited by pain General Behavior During Therapy: [facility] for tasks assessed/performed  GP    Efe Fazzino R 08/03/2014, 5:19 PM

## 2014-08-06 ENCOUNTER — Other Ambulatory Visit (HOSPITAL_COMMUNITY): Payer: Self-pay | Admitting: Family Medicine

## 2014-08-06 ENCOUNTER — Telehealth (HOSPITAL_COMMUNITY): Payer: Self-pay | Admitting: Physical Therapy

## 2014-08-07 ENCOUNTER — Other Ambulatory Visit (HOSPITAL_COMMUNITY): Payer: Self-pay | Admitting: Family Medicine

## 2014-08-07 ENCOUNTER — Ambulatory Visit (HOSPITAL_COMMUNITY): Payer: Medicare Other | Admitting: Physical Therapy

## 2014-08-07 DIAGNOSIS — N644 Mastodynia: Secondary | ICD-10-CM

## 2014-08-07 DIAGNOSIS — R509 Fever, unspecified: Secondary | ICD-10-CM

## 2014-08-09 ENCOUNTER — Ambulatory Visit (HOSPITAL_COMMUNITY): Payer: Medicare Other | Admitting: Physical Therapy

## 2014-08-14 ENCOUNTER — Ambulatory Visit (HOSPITAL_COMMUNITY)
Admission: RE | Admit: 2014-08-14 | Discharge: 2014-08-14 | Disposition: A | Discharge status: Home / Self Care | Payer: Medicare Other | Source: Ambulatory Visit | Attending: Orthopedic Surgery | Admitting: Orthopedic Surgery

## 2014-08-14 DIAGNOSIS — M25659 Stiffness of unspecified hip, not elsewhere classified: Secondary | ICD-10-CM | POA: Diagnosis not present

## 2014-08-14 DIAGNOSIS — M25559 Pain in unspecified hip: Secondary | ICD-10-CM | POA: Diagnosis not present

## 2014-08-14 DIAGNOSIS — I10 Essential (primary) hypertension: Secondary | ICD-10-CM | POA: Diagnosis not present

## 2014-08-14 DIAGNOSIS — E785 Hyperlipidemia, unspecified: Secondary | ICD-10-CM | POA: Diagnosis not present

## 2014-08-14 DIAGNOSIS — IMO0001 Reserved for inherently not codable concepts without codable children: Secondary | ICD-10-CM | POA: Insufficient documentation

## 2014-08-14 DIAGNOSIS — R269 Unspecified abnormalities of gait and mobility: Secondary | ICD-10-CM | POA: Diagnosis not present

## 2014-08-14 DIAGNOSIS — R29898 Other symptoms and signs involving the musculoskeletal system: Secondary | ICD-10-CM | POA: Diagnosis not present

## 2014-08-14 NOTE — Progress Notes (Signed)
Physical Therapy Treatment Patient Details  Name: Kelsey Ruiz MRN: [id number] Date of Birth: 1929/04/09  Today's Date: 08/14/2014 Time: 1420-1507 PT Time Calculation (min): 47 min   Charges: Manual therapy 1420-1440, TherEx 1440-1450, Gait training 1450-1507 Visit#: 11 of 16  Re-eval: 08/25/14 Assessment Diagnosis: lowback hip pain secondary to piriformis syndrome Prior Therapy: no  Authorization: [employer] - Medicare G code updated on 8th visit  Authorization Time Period:    Authorization Visit#: 11 of 18   Subjective: Symptoms/Limitations Symptoms: Patient arrives 3with noted improvemwnt with no pain since this past sunday. Patient sates  that she has gotten rid of the walker and is using a walking cane  Exercise/Treatments Stretches Hip Flexor Stretch: 3 reps;30 seconds Hip Flexor Stretch Limitations: on 14 in box for groin ITB Stretch: 3 reps;20 seconds;Limitations ITB Stretch Limitations: standing between 2 walls Piriformis Stretch: 2 reps;30 seconds;Limitations Piriformis Stretch Limitations: seated 3 directions  Aerobic Stationary Bike: nustep level 3 LE 8 minutes Standing Forward Lunge: 10 reps;Limitations Forward Lunge Limitations: walking Side Lunge: 10 reps;Limitations Side Lunge Limitations: walking Other Standing Lumbar Exercises: Sumo with red T-band 1 RT, 2D walkign hip excurison Other Standing Lumbar Exercises: Rocker board 10x each,   Physical Therapy Assessment and Plan PT Assessment and Plan Clinical Impression Statement: patient displays much improved gait with cain with decreased but continued trendelenberg gait, on Lt. Patient able to perform walking anterior and lateral lunges without support. Patient requires UE supprt with walking sumo exercise but demonstrated good step length with Red T-band. Patient states independence with HEP. No pain present throughtou session.  PT Plan:  Continue with groin, ITB, ad piriformis stretches to improve flexibilty  and gluteal strengthening as tolerated to improve activity tolerance and gait. Conitnue walking anterior and lateral lunges, and gait training.     Goals PT Short Term Goals PT Short Term Goal 1: Patient will demosnstrate increased hip internal rotaion mobility of 40 degrees indicatign improved deceleration of landing durign gait PT Short Term Goal 1 - Progress: Progressing toward goal PT Short Term Goal 2: Patient will increase hip extension to >10 degrees to increase stride length PT Short Term Goal 2 - Progress: Progressing toward goal PT Short Term Goal 3: Patient will demosntrate increased ankle dorsiflexion of 18 degrees to decrease early heel off during gait PT Short Term Goal 3 - Progress: Progressing toward goal PT Short Term Goal 4: Patient will demonstrates a negative pirfimornis mobility test with pain <2/10 indicatign improved ability to sit with pain <2/10 and improved ability to cross her Rt ankkle over her Lt knee while sitting PT Short Term Goal 4 - Progress: Progressing toward goal PT Short Term Goal 5: Patient will display a negative obers test indicating adequate ITband mobility for gait.  PT Short Term Goal 5 - Progress: Progressing toward goal PT Long Term Goals PT Long Term Goal 1: Patient will demsontrate hip extension strength of 4/5 to be able to sit from the chair 5x in <15seconds indicating patient not at high risk of falls PT Long Term Goal 1 - Progress: Progressing toward goal PT Long Term Goal 2: Patient will be able to walk 72minutes withtou and assitive device with pain <2/10 PT Long Term Goal 2 - Progress: Progressing toward goal Long Term Goal 3: Patient will demonstrate a TUG of <15 seconds indicatign patient is able to quickly stand up from a chair and walk without a risk of falling.  Long Term Goal 3 Progress: Progressing toward goal Long Term  Goal 4: Patient will demonstrate hip abduction strength of 4/5 with MMT indicatign patient is able to stand one leg  >10 seconds.  Long Term Goal 4 Progress: Progressing toward goal  Problem List Patient Active Problem List   Diagnosis Date Noted  . Abnormality of gait 06/21/2014  . Pain in joint, pelvic region and thigh 06/21/2014  . Stiffness of joint, not elsewhere classified, pelvic region and thigh 06/21/2014    PT - End of Session Activity Tolerance: Patient tolerated treatment well;Patient limited by pain General Behavior During Therapy: [facility] for tasks assessed/performed  GP    Mackayla Mullins R 08/14/2014, 3:18 PM

## 2014-08-16 ENCOUNTER — Ambulatory Visit (HOSPITAL_COMMUNITY)
Admission: RE | Admit: 2014-08-16 | Discharge: 2014-08-16 | Disposition: A | Discharge status: Home / Self Care | Payer: Medicare Other | Source: Ambulatory Visit | Attending: Orthopedic Surgery | Admitting: Orthopedic Surgery

## 2014-08-16 DIAGNOSIS — IMO0001 Reserved for inherently not codable concepts without codable children: Secondary | ICD-10-CM | POA: Diagnosis not present

## 2014-08-16 NOTE — Progress Notes (Signed)
Physical Therapy Treatment Patient Details  Name: Kelsey Ruiz MRN: [id number] Date of Birth: 11/20/1929  Today's Date: 08/16/2014 Time: 9390-3009 PT Time Calculation (min): 45 min Visit#: 12 of 16  Re-eval: 08/25/14 Authorization: [employer] - Medicare G code updated on 8th visit  Authorization Visit#: 12 of 18  Charges:  therex 1430-1505 (35'), manual 1505-1515 (10')  Subjective: Symptoms/Limitations Symptoms: Pt states she is having a little more discomfort today as compared to last visit.  Currently with 3/10 pain in her Rt hip region.   Exercise/Treatments Stretches Active Hamstring Stretch: 3 reps;30 seconds;Limitations Active Hamstring Stretch Limitations: standing on 14in box forward Hip Flexor Stretch: 3 reps;30 seconds Hip Flexor Stretch Limitations: on 14 in box for groin ITB Stretch: 3 reps;30 seconds;Limitations ITB Stretch Limitations: standing with 6" step Piriformis Stretch: 2 reps;30 seconds;Limitations Piriformis Stretch Limitations: seated 3 directions  Aerobic Stationary Bike: nustep level 3 LE 8 minutes   Manual Therapy Other Manual Therapy: Pt Lt sidelying with pillolw between knees, STM to piriformis, TFL and IT Band (completed by [employer], PT today)  Physical Therapy Assessment and Plan PT Assessment and Plan Clinical Impression Statement: Improving gait overall using SPC.  continued need for UE support with sumo exericises but increasing LE strength.  Manual techniques completed to Rt ITB and piriformis in Lt side lying by [employer], PT.  Pt with mild fascial restrictions remaining but with overall reduction in pain following manual techniques.  PT Plan:  Continue with stretches to improve flexibilty and continue with gluteal strengthening as tolerated to improve activity tolerance.       Problem List Patient Active Problem List   Diagnosis Date Noted  . Abnormality of gait 06/21/2014  . Pain in joint, pelvic region and thigh 06/21/2014  .  Stiffness of joint, not elsewhere classified, pelvic region and thigh 06/21/2014      Teena Irani, PTA/CLT 08/16/2014, 5:06 PM

## 2014-08-21 ENCOUNTER — Ambulatory Visit (HOSPITAL_COMMUNITY)
Admission: RE | Admit: 2014-08-21 | Discharge: 2014-08-21 | Disposition: A | Discharge status: Home / Self Care | Payer: Medicare Other | Source: Ambulatory Visit | Attending: Family Medicine | Admitting: Family Medicine

## 2014-08-21 ENCOUNTER — Ambulatory Visit (HOSPITAL_COMMUNITY): Admission: RE | Admit: 2014-08-21 | Payer: Medicare Other | Source: Ambulatory Visit | Admitting: Physical Therapy

## 2014-08-21 ENCOUNTER — Telehealth (HOSPITAL_COMMUNITY): Payer: Self-pay

## 2014-08-21 ENCOUNTER — Other Ambulatory Visit (HOSPITAL_COMMUNITY): Payer: Self-pay | Admitting: Family Medicine

## 2014-08-21 DIAGNOSIS — R509 Fever, unspecified: Secondary | ICD-10-CM

## 2014-08-21 DIAGNOSIS — N644 Mastodynia: Secondary | ICD-10-CM

## 2014-08-21 DIAGNOSIS — Z9889 Other specified postprocedural states: Secondary | ICD-10-CM | POA: Diagnosis not present

## 2014-08-21 MED ORDER — LIDOCAINE HCL (PF) 2 % IJ SOLN
INTRAMUSCULAR | Status: AC
  Filled 2014-08-21: qty 10

## 2014-08-23 ENCOUNTER — Ambulatory Visit (HOSPITAL_COMMUNITY)
Admission: RE | Admit: 2014-08-23 | Discharge: 2014-08-23 | Disposition: A | Discharge status: Home / Self Care | Payer: Medicare Other | Source: Ambulatory Visit | Attending: Orthopedic Surgery | Admitting: Orthopedic Surgery

## 2014-08-23 DIAGNOSIS — IMO0001 Reserved for inherently not codable concepts without codable children: Secondary | ICD-10-CM | POA: Diagnosis not present

## 2014-08-23 NOTE — Progress Notes (Signed)
Physical Therapy Treatment Patient Details  Name: Kelsey Ruiz MRN: [id number] Date of Birth: 04/01/1929  Today's Date: 08/23/2014 Time: 5929-2446 PT Time Calculation (min): 46 min Charge: TE [id number], Manual 1165-7903  Visit#: 13 of 16  Re-eval: 08/25/14    Authorization: [employer] - Medicare G code updated on 8th visit  Authorization Time Period:    Authorization Visit#: 13 of 18   Subjective: Symptoms/Limitations Symptoms: Pt stated she has increased pain today Rt hip region.  Reported she has been able to go without pain meds for 2 weeks with increased pain starting yesterday.  Pt entered dept ambulating with RW Pain Assessment Currently in Pain?: Yes Pain Score: 6  Pain Location: Hip Pain Orientation: Right  Objective:  Exercise/Treatments Stretches Active Hamstring Stretch: 3 reps;30 seconds;Limitations Active Hamstring Stretch Limitations: standing on 14in box forward Hip Flexor Stretch: 3 reps;30 seconds Hip Flexor Stretch Limitations: on 14 in box for groin ITB Stretch: 3 reps;30 seconds;Limitations ITB Stretch Limitations: standing with 6" step Piriformis Stretch: 2 reps;30 seconds;Limitations Piriformis Stretch Limitations: seated 3 directions  Aerobic Stationary Bike: nustep level 3 LE resistance 4 8 minutes STM 98 Standing Functional Squats: 10 reps;Limitations Functional Squats Limitations: 3D hip excursin Other Standing Lumbar Exercises: 2D walking 2RT  Manual Therapy Other Manual Therapy: Pt Lt sidelying with pillolw between knees, STM to piriformis, TFL and IT Band; utilized rolling stick on ITB  Physical Therapy Assessment and Plan PT Assessment and Plan Clinical Impression Statement: Pt ambulated with RW into dept due to increased pain today.  Noted decreased stance phase on Rt LE due to pain.  Session focus on improving gait mechanics for equalized stance phase and improved posture for pain relief.  Checked SI alignment following reports of s/s, SI  within alignment no MET (Muscle energy technique) required.  Manual techniques complete to reduce fascial restrictions in Rt piriformis,TFL and ITBand for pain relief.   Pt reported pain reduced to 2/10 at end of session.  PT Plan:  Reassess next session.  Continue with stretches to improve flexibilty and continue with gluteal strengthening as tolerated to improve activity tolerance.     Goals PT Short Term Goals PT Short Term Goal 1: Patient will demosnstrate increased hip internal rotaion mobility of 40 degrees indicatign improved deceleration of landing durign gait PT Short Term Goal 1 - Progress: Progressing toward goal PT Short Term Goal 2: Patient will increase hip extension to >10 degrees to increase stride length PT Short Term Goal 2 - Progress: Progressing toward goal PT Short Term Goal 3: Patient will demosntrate increased ankle dorsiflexion of 18 degrees to decrease early heel off during gait PT Short Term Goal 3 - Progress: Progressing toward goal PT Short Term Goal 4: Patient will demonstrates a negative pirfimornis mobility test with pain <2/10 indicatign improved ability to sit with pain <2/10 and improved ability to cross her Rt ankkle over her Lt knee while sitting PT Short Term Goal 4 - Progress: Progressing toward goal PT Short Term Goal 5: Patient will display a negative obers test indicating adequate ITband mobility for gait.  PT Short Term Goal 5 - Progress: Progressing toward goal PT Long Term Goals PT Long Term Goal 1: Patient will demsontrate hip extension strength of 4/5 to be able to sit from the chair 5x in <15seconds indicating patient not at high risk of falls PT Long Term Goal 1 - Progress: Progressing toward goal PT Long Term Goal 2: Patient will be able to walk 30mnutes without and assitive  device with pain <2/10 Long Term Goal 3: Patient will demonstrate a TUG of <15 seconds indicatign patient is able to quickly stand up from a chair and walk without a risk of  falling.  Long Term Goal 4: Patient will demonstrate hip abduction strength of 4/5 with MMT indicatign patient is able to stand one leg >10 seconds.   Problem List Patient Active Problem List   Diagnosis Date Noted  . Abnormality of gait 06/21/2014  . Pain in joint, pelvic region and thigh 06/21/2014  . Stiffness of joint, not elsewhere classified, pelvic region and thigh 06/21/2014    PT - End of Session Activity Tolerance: Patient tolerated treatment well;Patient limited by pain General Behavior During Therapy: [facility] for tasks assessed/performed  GP    Aldona Lento 08/23/2014, 3:34 PM

## 2014-08-25 LAB — TISSUE CULTURE: Culture: NO GROWTH

## 2014-08-27 ENCOUNTER — Telehealth (HOSPITAL_COMMUNITY): Payer: Self-pay

## 2014-08-28 ENCOUNTER — Ambulatory Visit (HOSPITAL_COMMUNITY)
Admission: RE | Admit: 2014-08-28 | Discharge: 2014-08-28 | Disposition: A | Discharge status: Home / Self Care | Payer: Medicare Other | Source: Ambulatory Visit | Attending: Family Medicine | Admitting: Family Medicine

## 2014-08-28 DIAGNOSIS — IMO0001 Reserved for inherently not codable concepts without codable children: Secondary | ICD-10-CM | POA: Diagnosis not present

## 2014-08-28 NOTE — Progress Notes (Addendum)
Physical Therapy Re-evaluation/Treatment Note  Patient Details  Name: Kelsey Ruiz MRN: [id number] Date of Birth: October 18, 1929  Today's Date: 08/28/2014 Time: 7606-6785 PT Time Calculation (min): 48 min Charge: TE [id number], MMT/ROM Measurement [phone], Manual [phone]              Visit#: 14 of 22  Re-eval: 09/25/14 Assessment Diagnosis: lowback hip pain secondary to piriformis syndrome Prior Therapy: no  Authorization: [employer] - Medicare G code updated on 8th and 14th visit    Authorization Time Period:    Authorization Visit#: 14 of 24   Subjective Symptoms/Limitations Symptoms: Pt reported she drove to therapy today for 2nd time in 4 months.  Current pain scale 4/10 from hip down to knee.  Reports a fall yesterday on stair with bruise on Lt thigh. How long can you sit comfortably?: Able to sit comfortably through church for 30 minutes (was <59minutes) How long can you stand comfortably?: 20 minutes helps with cooking in kitchen (was <56minutes) How long can you walk comfortably?: 10 minutes on drive way to mail box with RW (was <62minutes) Pain Assessment Currently in Pain?: Yes Pain Score: 4  Pain Location: Hip Pain Orientation: Right   Sensation/Coordination/Flexibility/Functional Tests Flexibility Thomas: Positive (Positive) 90/90: Negative (Positive) Functional Tests Functional Tests: FOTO 53% was 60%  Assessment RLE AROM (degrees) Right Hip Extension: 10 (was 7) Right Hip Flexion: 120 (was 120) Right Knee Extension: 0 (was 0) Right Knee Flexion: 120 (was 120) Right Ankle Dorsiflexion: 20 (was 10) RLE Strength Right Hip Flexion: 4/5 (was 3+/5) Right Hip Extension: 3-/5 (was 2+/5) Right Hip External Rotation : 50 (was 50) Right Hip Internal Rotation : 16 (was 16) Right Hip ABduction: 3-/5 (was 2+/5) Right Knee Flexion:  (4+/5 was 4/5) Right Knee Extension:  (4+/5 was 4+/5) Right Ankle Dorsiflexion: 4/5 (was 4/5)  Exercise/Treatments Stretches Active  Hamstring Stretch: 3 reps;30 seconds;Limitations Active Hamstring Stretch Limitations: standing on 14in box forward ITB Stretch: 3 reps;30 seconds;Limitations ITB Stretch Limitations: standing with 6" step Piriformis Stretch: 4 reps;30 seconds Piriformis Stretch Limitations: seated 3 directions  Standing Functional Squats: 10 reps;Limitations Functional Squats Limitations: 3D hip excursin  Manual Therapy Manual Therapy: Other (comment) Other Manual Therapy: Pt Lt sidelying with pillolw between knees, STM to piriformis, TFL and IT Band; utilized rolling stick on ITB  Physical Therapy Assessment and Plan PT Assessment and Plan Clinical Impression Statement: Reassessment complete with the following findings:  Pt independent with HEP and able to demonstrate or verbalize appropriate technique for all exercises.  Pt has improved hip extension and dorsiflexion with improved posture following cueing to reduce forward flexed trunk position.  RW height adjusted with noted improved posture following.  Pt continues to be limited with IR/ER Rt hip.  Pt c/o pain Rt hip TFL and ITB region.  Manual techniques assist with pain relief.  Overall hip musculature has improved but continues to be weak which increases pt.'s risk of falls and pain.  Recommend continuing OPPT for 4 more weeks to improve hip extension, hip muscualture strengthening and improve posture that will assist with balance.   PT Plan: Recommend continuing OPPT for 4 more weeks to improve hip mobilty to assist wtih posture and gluteal strengthening     Goals Home Exercise Program PT Goal: Perform Home Exercise Program - Progress: Met (2x daily) PT Short Term Goals PT Short Term Goal 1: Patient will demosnstrate increased hip internal rotaion mobility of 40 degrees indicatign improved deceleration of landing durign gait PT Short Term Goal  1 - Progress: Progressing toward goal PT Short Term Goal 2: Patient will increase hip extension to >10  degrees to increase stride length PT Short Term Goal 2 - Progress: Progressing toward goal PT Short Term Goal 3: Patient will demosntrate increased ankle dorsiflexion of 18 degrees to decrease early heel off during gait PT Short Term Goal 3 - Progress: Progressing toward goal PT Short Term Goal 4: Patient will demonstrates a negative pirfimornis mobility test with pain <2/10 indicatign improved ability to sit with pain <2/10 and improved ability to cross her Rt ankkle over her Lt knee while sitting PT Short Term Goal 4 - Progress: Progressing toward goal PT Short Term Goal 5: Patient will display a negative obers test indicating adequate ITband mobility for gait.  PT Short Term Goal 5 - Progress: Progressing toward goal PT Long Term Goals PT Long Term Goal 1: Patient will demsontrate hip extension strength of 4/5 to be able to sit from the chair 5x in <15seconds indicating patient not at high risk of falls PT Long Term Goal 1 - Progress: Progressing toward goal PT Long Term Goal 2: Patient will be able to walk 60minutes without and assitive device with pain <2/10 PT Long Term Goal 2 - Progress: Progressing toward goal Long Term Goal 3: Patient will demonstrate a TUG of <15 seconds indicatign patient is able to quickly stand up from a chair and walk without a risk of falling.  Long Term Goal 3 Progress: Met (14" ) Long Term Goal 4: Patient will demonstrate hip abduction strength of 4/5 with MMT indicatign patient is able to stand one leg >10 seconds.  Long Term Goal 4 Progress: Progressing toward goal  Problem List Patient Active Problem List   Diagnosis Date Noted  . Abnormality of gait 06/21/2014  . Pain in joint, pelvic region and thigh 06/21/2014  . Stiffness of joint, not elsewhere classified, pelvic region and thigh 06/21/2014    PT - End of Session Activity Tolerance: Patient tolerated treatment well General Behavior During Therapy: WFL for tasks  assessed/performed  GP Functional Assessment Tool Used: FOTO 47% limited, was 60% limited Functional Limitation: Mobility: Walking and moving around Mobility: Walking and Moving Around Current Status ([id number]): At least 40 percent but less than 60 percent impaired, limited or restricted Mobility: Walking and Moving Around Goal Status [phone]): At least 40 percent but less than 60 percent impaired, limited or restricted  Aldona Lento 08/28/2014, 5:11 PM  Physician Documentation Your signature is required to indicate approval of the treatment plan as stated above.  Please sign and either send electronically or make a copy of this report for your files and return this physician signed original.   Please mark one 1.__approve of plan  2. ___approve of plan with the following conditions.   ______________________________                                                          _____________________ Physician Signature  Date  

## 2014-08-29 NOTE — Progress Notes (Signed)
Physical Therapy Re-evaluation/Treatment Note  Patient Details  Name: Kelsey Ruiz MRN: [id number] Date of Birth: March 06, 1929  Today's Date: 08/28/2014 Time: 5461-4993 PT Time Calculation (min): 48 min Charge: TE [id number], MMT/ROM Measurement [phone], Manual [phone]              Visit#: 14 of 22  Re-eval: 09/25/14 Assessment Diagnosis: lowback hip pain secondary to piriformis syndrome Prior Therapy: no  Authorization: [employer] - Medicare G code updated on 8th and 14th visit    Authorization Time Period:    Authorization Visit#: 14 of 24   Subjective Symptoms/Limitations Symptoms: Pt reported she drove to therapy today for 2nd time in 4 months.  Current pain scale 4/10 from hip down to knee.  Reports a fall yesterday on stair with bruise on Lt thigh. How long can you sit comfortably?: Able to sit comfortably through church for 30 minutes (was <26minutes) How long can you stand comfortably?: 20 minutes helps with cooking in kitchen (was <36minutes) How long can you walk comfortably?: 10 minutes on drive way to mail box with RW (was <32minutes) Pain Assessment Currently in Pain?: Yes Pain Score: 4  Pain Location: Hip Pain Orientation: Right   Sensation/Coordination/Flexibility/Functional Tests Flexibility Thomas: Positive (Positive) 90/90: Negative (Positive) Functional Tests Functional Tests: FOTO 53% was 60%  Assessment RLE AROM (degrees) Right Hip Extension: 10 (was 7) Right Hip Flexion: 120 (was 120) Right Knee Extension: 0 (was 0) Right Knee Flexion: 120 (was 120) Right Ankle Dorsiflexion: 20 (was 10) RLE Strength Right Hip Flexion: 4/5 (was 3+/5) Right Hip Extension: 3-/5 (was 2+/5) Right Hip External Rotation : 50 (was 50) Right Hip Internal Rotation : 16 (was 16) Right Hip ABduction: 3-/5 (was 2+/5) Right Knee Flexion:  (4+/5 was 4/5) Right Knee Extension:  (4+/5 was 4+/5) Right Ankle Dorsiflexion: 4/5 (was 4/5)  Exercise/Treatments Stretches Active  Hamstring Stretch: 3 reps;30 seconds;Limitations Active Hamstring Stretch Limitations: standing on 14in box forward ITB Stretch: 3 reps;30 seconds;Limitations ITB Stretch Limitations: standing with 6" step Piriformis Stretch: 4 reps;30 seconds Piriformis Stretch Limitations: seated 3 directions  Standing Functional Squats: 10 reps;Limitations Functional Squats Limitations: 3D hip excursin  Manual Therapy Manual Therapy: Other (comment) Other Manual Therapy: Pt Lt sidelying with pillolw between knees, STM to piriformis, TFL and IT Band; utilized rolling stick on ITB  Physical Therapy Assessment and Plan PT Assessment and Plan Clinical Impression Statement: Reassessment complete with the following findings:  Pt independent with HEP and able to demonstrate or verbalize appropriate technique for all exercises.  Pt has improved hip extension and dorsiflexion with improved posture following cueing to reduce forward flexed trunk position.  RW height adjusted with noted improved posture following.  Pt continues to be limited with IR/ER Rt hip.  Pt c/o pain Rt hip TFL and ITB region.  Manual techniques assist with pain relief.  Overall hip musculature has improved but continues to be weak which increases pt.'s risk of falls and pain.  Recommend continuing OPPT for 4 more weeks to improve hip extension, hip muscualture strengthening and improve posture that will assist with balance.   PT Plan: Recommend continuing OPPT for 4 more weeks to improve hip mobilty to assist wtih posture and gluteal strengthening     Goals Home Exercise Program PT Goal: Perform Home Exercise Program - Progress: Met (2x daily) PT Short Term Goals PT Short Term Goal 1: Patient will demosnstrate increased hip internal rotaion mobility of 40 degrees indicatign improved deceleration of landing durign gait PT Short Term Goal  1 - Progress: Progressing toward goal PT Short Term Goal 2: Patient will increase hip extension to >10  degrees to increase stride length PT Short Term Goal 2 - Progress: Progressing toward goal PT Short Term Goal 3: Patient will demosntrate increased ankle dorsiflexion of 18 degrees to decrease early heel off during gait PT Short Term Goal 3 - Progress: Progressing toward goal PT Short Term Goal 4: Patient will demonstrates a negative pirfimornis mobility test with pain <2/10 indicatign improved ability to sit with pain <2/10 and improved ability to cross her Rt ankkle over her Lt knee while sitting PT Short Term Goal 4 - Progress: Progressing toward goal PT Short Term Goal 5: Patient will display a negative obers test indicating adequate ITband mobility for gait.  PT Short Term Goal 5 - Progress: Progressing toward goal PT Long Term Goals PT Long Term Goal 1: Patient will demsontrate hip extension strength of 4/5 to be able to sit from the chair 5x in <15seconds indicating patient not at high risk of falls PT Long Term Goal 1 - Progress: Progressing toward goal PT Long Term Goal 2: Patient will be able to walk 11minutes without and assitive device with pain <2/10 PT Long Term Goal 2 - Progress: Progressing toward goal Long Term Goal 3: Patient will demonstrate a TUG of <15 seconds indicatign patient is able to quickly stand up from a chair and walk without a risk of falling.  Long Term Goal 3 Progress: Met (14" ) Long Term Goal 4: Patient will demonstrate hip abduction strength of 4/5 with MMT indicatign patient is able to stand one leg >10 seconds.  Long Term Goal 4 Progress: Progressing toward goal  Problem List Patient Active Problem List   Diagnosis Date Noted  . Abnormality of gait 06/21/2014  . Pain in joint, pelvic region and thigh 06/21/2014  . Stiffness of joint, not elsewhere classified, pelvic region and thigh 06/21/2014    PT - End of Session Activity Tolerance: Patient tolerated treatment well General Behavior During Therapy: WFL for tasks  assessed/performed  GP Functional Assessment Tool Used: FOTO 47% limited, was 60% limited Functional Limitation: Mobility: Walking and moving around Mobility: Walking and Moving Around Current Status ([id number]): At least 40 percent but less than 60 percent impaired, limited or restricted Mobility: Walking and Moving Around Goal Status [phone]): At least 40 percent but less than 60 percent impaired, limited or restricted  Aldona Lento 08/28/2014, 5:11 PM  Devona Konig PT DPT  Physician Documentation Your signature is required to indicate approval of the treatment plan as stated above.  Please sign and either send electronically or make a copy of this report for your files and return this physician signed original.   Please mark one 1.__approve of plan  2. ___approve of plan with the following conditions.   ______________________________                                                          _____________________ Physician Signature  Date  

## 2014-08-30 ENCOUNTER — Ambulatory Visit (HOSPITAL_COMMUNITY)
Admission: RE | Admit: 2014-08-30 | Discharge: 2014-08-30 | Disposition: A | Discharge status: Home / Self Care | Payer: Medicare Other | Source: Ambulatory Visit | Attending: Family Medicine | Admitting: Family Medicine

## 2014-08-30 DIAGNOSIS — IMO0001 Reserved for inherently not codable concepts without codable children: Secondary | ICD-10-CM | POA: Diagnosis not present

## 2014-08-30 NOTE — Progress Notes (Signed)
Physical Therapy Treatment Patient Details  Name: Kelsey Ruiz MRN: [id number] Date of Birth: 10/11/1929  Today's Date: 08/30/2014 Time: 8502-7741 PT Time Calculation (min): 40 min TE 1435-1450, MT 1451-1505, ice [phone]  Visit#: 15 of 22  Re-eval: 09/25/14      Subjective: Symptoms/Limitations Symptoms: Pt reports she reports increased complaints of Rt hip/leg pain to 7/10 today.  Pt reports she had to take a hydrocodone today, and pain is still at 7/10.  Pain Assessment Currently in Pain?: Yes Pain Score: 7  Pain Location: Leg Pain Orientation: Right   Exercise/Treatments Stretches Active Hamstring Stretch: 3 reps;30 seconds;Limitations Active Hamstring Stretch Limitations: standing on 14in box forward Quad Stretch: 2 reps;20 seconds;Limitations Quad Stretch Limitations: Manual stetch in prone ITB Stretch: 3 reps;30 seconds;Limitations ITB Stretch Limitations: standing with 6" step Piriformis Stretch: 3 reps;30 seconds Piriformis Stretch Limitations: seated 3 directions  Standing Other Standing Lumbar Exercises: Gastroc Stretch, Slantboard, 20" x3 Other Standing Lumbar Exercises: 3D Hip Excursion  Modalities Modalities: Cryotherapy Manual Therapy Manual Therapy: Myofascial release Myofascial Release: STT to rolling stick to lateral leg (ITB), rolling stick to HS/quad Cryotherapy Number Minutes Cryotherapy: 10 Minutes Cryotherapy Location: Hip Type of Cryotherapy: Ice pack  Physical Therapy Assessment and Plan PT Assessment and Plan Clinical Impression Statement: Pt reports increased complaints of pain today, despite taking her pain medication.  Pt agreed to complete stretching, though facial grimacing noted with standing stretches. Added manual quad stretch as pt points to ITB/quad area where tenderness is located.  Pt reports increased complaints of stretching on the Rt > Lt side, though tightness noted on both sides.   Pt was able to complete stretching  and mobility exercises, and tolerated MT treatment though unable to perform strengthening exercises today secondary to pain.  Pt will benefit from skilled therapeutic intervention in order to improve on the following deficits: Abnormal gait;Decreased activity tolerance;Decreased balance;Decreased mobility;Decreased range of motion;Decreased strength;Difficulty walking;Increased fascial restricitons;Increased muscle spasms;Impaired flexibility;Impaired sensation;Improper body mechanics;Pain PT Plan: Progress back into strengthening program as tolerated. Continue quad stretch, manual or with rope.     Goals PT Short Term Goals PT Short Term Goal 2: Patient will increase hip extension to >10 degrees to increase stride length PT Short Term Goal 2 - Progress: Progressing toward goal PT Short Term Goal 3: Patient will demosntrate increased ankle dorsiflexion of 18 degrees to decrease early heel off during gait PT Short Term Goal 3 - Progress: Progressing toward goal PT Short Term Goal 4: Patient will demonstrates a negative pirfimornis mobility test with pain <2/10 indicatign improved ability to sit with pain <2/10 and improved ability to cross her Rt ankkle over her Lt knee while sitting PT Short Term Goal 4 - Progress: Progressing toward goal  Problem List Patient Active Problem List   Diagnosis Date Noted  . Abnormality of gait 06/21/2014  . Pain in joint, pelvic region and thigh 06/21/2014  . Stiffness of joint, not elsewhere classified, pelvic region and thigh 06/21/2014    PT - End of Session Activity Tolerance: Patient tolerated treatment well General Behavior During Therapy: [facility] for tasks assessed/performed   Thursa Emme 08/30/2014, 3:32 PM

## 2014-09-04 ENCOUNTER — Ambulatory Visit (HOSPITAL_COMMUNITY)
Admission: RE | Admit: 2014-09-04 | Discharge: 2014-09-04 | Disposition: A | Discharge status: Home / Self Care | Payer: Medicare Other | Source: Ambulatory Visit | Attending: Orthopedic Surgery | Admitting: Orthopedic Surgery

## 2014-09-04 DIAGNOSIS — IMO0001 Reserved for inherently not codable concepts without codable children: Secondary | ICD-10-CM | POA: Diagnosis not present

## 2014-09-04 NOTE — Progress Notes (Signed)
Physical Therapy Treatment Patient Details  Name: Kelsey Ruiz MRN: [id number] Date of Birth: 1929-05-25  Today's Date: 09/04/2014 Time: 4158-3094 PT Time Calculation (min): 43 min    Charges: TE 0768-0881, manual 1031 - 5945 Visit#: 16 of 22  Re-eval: 09/25/14 Assessment Diagnosis: lowback hip pain secondary to piriformis syndrome Prior Therapy: no  Authorization: [employer] - Medicare G code updated on 8th and 14th visit   Authorization Visit#: 14 of 24   Subjective: Symptoms/Limitations Symptoms: Patient states she is discouraged with her progress as she continues to have pain and her hip has been flairing up lately that she attributes to the weather.  Pain Assessment Currently in Pain?: Yes Pain Score: 4  Pain Location: Leg Pain Orientation: Right Pain Type: Chronic pain  Exercise/Treatments Stretches Active Hamstring Stretch: 3 reps;30 seconds;Limitations Active Hamstring Stretch Limitations: standing on 14in box forward and same side rotation Piriformis Stretch: 3 reps;30 seconds Piriformis Stretch Limitations: seated 3 directions  Standing Functional Squats Limitations: Squat: neutral, internal rotation and external rotation 5x each Supine Clam: 20 reps;Limitations Clam Limitations: blue T-band Sidelying Hip Abduction: 10 reps;Limitations Hip Abduction Limitations: therapist cuin to increase glut medius use.   Manual: Piriformis release  Physical Therapy Assessment and Plan PT Assessment and Plan Clinical Impression Statement: Patient displasy significantly improved ROm in Rt hip and LE today but conitnues to have pain along piriformis and Rt lateral thigh secondary to decreased strength in glut med/max and hamstrings. manual performed to decreased lateral thigh muscles stiffness resulting in minor improvement in pain.  PT Plan: Conintue strengthening of glutes, hamstrigns, and quads to decrease strain on pirifomris and progress ROM of piriformis muscle. utilize  pecific argeted exercises for glut med strengtheing in addition to fuinctional strengthening.     Goals PT Short Term Goals PT Short Term Goal 1: Patient will demosnstrate increased hip internal rotaion mobility of 40 degrees indicatign improved deceleration of landing durign gait PT Short Term Goal 1 - Progress: Progressing toward goal PT Short Term Goal 2: Patient will increase hip extension to >10 degrees to increase stride length PT Short Term Goal 2 - Progress: Progressing toward goal PT Short Term Goal 3: Patient will demosntrate increased ankle dorsiflexion of 18 degrees to decrease early heel off during gait PT Short Term Goal 3 - Progress: Progressing toward goal PT Short Term Goal 4: Patient will demonstrates a negative pirfimornis mobility test with pain <2/10 indicatign improved ability to sit with pain <2/10 and improved ability to cross her Rt ankkle over her Lt knee while sitting PT Short Term Goal 4 - Progress: Progressing toward goal  Problem List Patient Active Problem List   Diagnosis Date Noted  . Abnormality of gait 06/21/2014  . Pain in joint, pelvic region and thigh 06/21/2014  . Stiffness of joint, not elsewhere classified, pelvic region and thigh 06/21/2014    PT - End of Session Activity Tolerance: Patient tolerated treatment well General Behavior During Therapy: [facility] for tasks assessed/performed  GP    Jenascia Bumpass R 09/04/2014, 3:23 PM

## 2014-09-06 ENCOUNTER — Ambulatory Visit (HOSPITAL_COMMUNITY)
Admission: RE | Admit: 2014-09-06 | Discharge: 2014-09-06 | Disposition: A | Discharge status: Home / Self Care | Payer: Medicare Other | Source: Ambulatory Visit | Attending: Family Medicine | Admitting: Family Medicine

## 2014-09-06 DIAGNOSIS — IMO0001 Reserved for inherently not codable concepts without codable children: Secondary | ICD-10-CM | POA: Diagnosis not present

## 2014-09-06 NOTE — Progress Notes (Signed)
Physical Therapy Treatment Patient Details  Name: Kelsey Ruiz MRN: [id number] Date of Birth: February 22, 1929  Today's Date: 09/06/2014 Time: 1515-1600 PT Time Calculation (min): 45 min   Charges: TE 3614-4315, Manual 1551-1600 Visit#: 17 of 22  Re-eval: 09/25/14 Assessment Diagnosis: lowback hip pain secondary to piriformis syndrome Prior Therapy: no  Authorization: [employer] - Medicare G code updated on 8th and 14th visit  Authorization Time Period:    Authorization Visit#: 15 of 24   Subjective: Symptoms/Limitations Symptoms: Patient states she has been felling better after Tuesday's session. Pertinent History: Patient's hip pain began the second weelk of may and nor radiates into lower backl and anteriro and posterio thigh, radiates into Rt foot. 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,  Pain Assessment Currently in Pain?: Yes Pain Score: 4  Pain Location: Hip Pain Orientation: Right Pain Type: Chronic pain Pain Radiating Towards: Rt hip  Exercise/Treatments Stretches Active Hamstring Stretch: 3 reps;30 seconds;Limitations Active Hamstring Stretch Limitations: standing on 14in box forward and same side rotation Piriformis Stretch: 3 reps;30 seconds Piriformis Stretch Limitations: seated 3 directions  Standing Functional Squats Limitations: Squat matrix with UE support 5x each Forward Lunge Limitations: 10x to 4" step Side Lunge Limitations: 10x to 4" step Other Standing Lumbar Exercises: walking 2D hip excursion 30 ft Seated Other Seated Lumbar Exercises: clams 20x with Blue T-band Sidelying Hip Abduction: 10 reps;Limitations Hip Abduction Limitations: therapist cuin to increase glut medius use.    Manual: piriformis release and lateral quad myofascial release  Physical Therapy Assessment and Plan PT Assessment and Plan Clinical Impression Statement: This session focused on pain limited strenghtieng exercises to increase  glut max/med and hamstring strength to improve LE stability during gait to decrease strain on piriformis muscle. manual performed to decreased lateral thigh muscles stiffness resulting in minor improvement in  pain PT Plan: Conintue strengthening of glutes, hamstrigns, and quads to decrease strain on piriformis utilizing pecific targeted exercises for glut med strengthening in addition to functional strengthening.     Problem List Patient Active Problem List   Diagnosis Date Noted  . Abnormality of gait 06/21/2014  . Pain in joint, pelvic region and thigh 06/21/2014  . Stiffness of joint, not elsewhere classified, pelvic region and thigh 06/21/2014    PT - End of Session Activity Tolerance: Patient tolerated treatment well General Behavior During Therapy: [facility] for tasks assessed/performed  GP    Arie Gable R 09/06/2014, 6:45 PM

## 2014-09-11 ENCOUNTER — Ambulatory Visit (HOSPITAL_COMMUNITY)
Admission: RE | Admit: 2014-09-11 | Discharge: 2014-09-11 | Disposition: A | Discharge status: Home / Self Care | Payer: Medicare Other | Source: Ambulatory Visit | Attending: Orthopedic Surgery | Admitting: Orthopedic Surgery

## 2014-09-11 DIAGNOSIS — IMO0001 Reserved for inherently not codable concepts without codable children: Secondary | ICD-10-CM | POA: Diagnosis not present

## 2014-09-11 NOTE — Progress Notes (Signed)
Physical Therapy Treatment Patient Details  Name: Kelsey Ruiz MRN: [id number] Date of Birth: [birth year]  Today's Date: 09/11/2014 Time: 5956-3875 PT Time Calculation (min): 37 min Charges: TE: [id number], Manual [id number]  Visit#: 18 of 22  Re-eval: 09/25/14 Assessment Diagnosis: lowback hip pain secondary to piriformis syndrome Prior Therapy: no  Authorization: [employer] - Medicare G code updated on 8th and 14th visit  Authorization Visit#: 18 of     Subjective: Symptoms/Limitations Symptoms: Patient states she has been felling better after lest weeks sessions. Pain Assessment Currently in Pain?: No/denies  Exercise/Treatments Stretches Active Hamstring Stretch: 3 reps;30 seconds;Limitations Active Hamstring Stretch Limitations: standing on 14in box forward and same side rotation Piriformis Stretch: 3 reps;30 seconds Piriformis Stretch Limitations: seated 3 directions  Aerobic Stationary Bike: Nustep: 24mn Standing Functional Squats Limitations: Squat matrix with UE support 5x each Forward Lunge Limitations: 10x to 4" step Side Lunge Limitations: 10x to 4" step Other Standing Lumbar Exercises: walking 2D hip excursion 30 ft Sidelying Hip Abduction: 10 reps;Limitations Hip Abduction Limitations: therapist cuin to increase glut medius use.   Manual Therapy: STT tto lateral leg (ITB) to HS/quad 118mutes, strain counter strain technique for IT-Band resulted in immediate decrease in muscle tension/tenderness/pain.   Physical Therapy Assessment and Plan PT Assessment and Plan Clinical Impression Statement: This session conitnued focused on pain limited strenghtening exercises to increase glut max/med and hamstring strength to improve LE stability during gait to decrease strain on piriformis muscle. manual performed to decreased lateral thigh muscles stiffness resulting in minor improvement in pain. [name] with improved ability to walk with  quad cain, decreased pain, and improved squat depth with decreased reliance of UE assistance.  PT Plan: Conintue strengthening of glutes, hamstrigns, and quads to decrease strain on piriformis. Anticipate discharge follwoing next session.     Goals PT Short Term Goals PT Short Term Goal 1: Patient will demosnstrate increased hip internal rotaion mobility of 40 degrees indicatign improved deceleration of landing durign gait PT Short Term Goal 1 - Progress: Progressing toward goal PT Short Term Goal 2: Patient will increase hip extension to >10 degrees to increase stride length PT Short Term Goal 2 - Progress: Progressing toward goal PT Short Term Goal 3: Patient will demosntrate increased ankle dorsiflexion of 18 degrees to decrease early heel off during gait PT Short Term Goal 3 - Progress: Progressing toward goal PT Short Term Goal 4: Patient will demonstrates a negative pirfimornis mobility test with pain <2/10 indicatign improved ability to sit with pain <2/10 and improved ability to cross her Rt ankkle over her Lt knee while sitting PT Short Term Goal 4 - Progress: Progressing toward goal PT Short Term Goal 5: Patient will display a negative obers test indicating adequate ITband mobility for gait.  PT Short Term Goal 5 - Progress: Progressing toward goal PT Long Term Goals PT Long Term Goal 1: Patient will demsontrate hip extension strength of 4/5 to be able to sit from the chair 5x in <15seconds indicating patient not at high risk of falls PT Long Term Goal 1 - Progress: Progressing toward goal PT Long Term Goal 2: Patient will be able to walk 3085mtes without and assitive device with pain <2/10 PT Long Term Goal 2 - Progress: Progressing toward goal Long Term Goal 3: Patient will demonstrate a TUG of <15 seconds indicatign patient is able to quickly stand up from a chair and walk without a risk of falling.  Long Term Goal 3 Progress:  Met Long Term Goal 4: Patient will demonstrate hip  abduction strength of 4/5 with MMT indicatign patient is able to stand one leg >10 seconds.  Long Term Goal 4 Progress: Progressing toward goal  Problem List Patient Active Problem List   Diagnosis Date Noted  . Abnormality of gait 06/21/2014  . Pain in joint, pelvic region and thigh 06/21/2014  . Stiffness of joint, not elsewhere classified, pelvic region and thigh 06/21/2014    PT - End of Session Activity Tolerance: Patient tolerated treatment well General Behavior During Therapy: [facility] for tasks assessed/performed  GP    Kelsey Ruiz 09/11/2014, 12:32 PM

## 2014-09-13 ENCOUNTER — Ambulatory Visit (HOSPITAL_COMMUNITY)
Admission: RE | Admit: 2014-09-13 | Discharge: 2014-09-13 | Disposition: A | Discharge status: Home / Self Care | Payer: Medicare Other | Source: Ambulatory Visit | Attending: Family Medicine | Admitting: Family Medicine

## 2014-09-13 DIAGNOSIS — M25659 Stiffness of unspecified hip, not elsewhere classified: Secondary | ICD-10-CM | POA: Diagnosis not present

## 2014-09-13 DIAGNOSIS — G5700 Lesion of sciatic nerve, unspecified lower limb: Secondary | ICD-10-CM | POA: Diagnosis not present

## 2014-09-13 DIAGNOSIS — M25559 Pain in unspecified hip: Secondary | ICD-10-CM | POA: Insufficient documentation

## 2014-09-13 DIAGNOSIS — E039 Hypothyroidism, unspecified: Secondary | ICD-10-CM | POA: Insufficient documentation

## 2014-09-13 DIAGNOSIS — Z5189 Encounter for other specified aftercare: Secondary | ICD-10-CM | POA: Insufficient documentation

## 2014-09-13 DIAGNOSIS — I10 Essential (primary) hypertension: Secondary | ICD-10-CM | POA: Diagnosis not present

## 2014-09-13 DIAGNOSIS — M545 Low back pain: Secondary | ICD-10-CM | POA: Diagnosis not present

## 2014-09-13 DIAGNOSIS — R269 Unspecified abnormalities of gait and mobility: Secondary | ICD-10-CM | POA: Insufficient documentation

## 2014-09-13 NOTE — Evaluation (Signed)
Physical Therapy Discharge  Patient Details  Name: Kelsey Ruiz MRN: [id number] Date of Birth: [birth year]  Today's Date: 09/13/2014 Time: 2595-6387 PT Time Calculation (min): 41 min      Charges: TE [id number]        Visit#: 25 of 22  Re-eval:   Assessment Diagnosis: lowback hip pain secondary to piriformis syndrome Prior Therapy: no  Authorization: [employer] - Medicare G code updated on 8th and 14th visit    Authorization Time Period:    Authorization Visit#: 62 of 24   Past Medical History:  Past Medical History  Diagnosis Date  . Hypertension   . Glaucoma   . GERD (gastroesophageal reflux disease)   . Hypothyroidism   . Hyperlipemia   . Arthritis    Past Surgical History:  Past Surgical History  Procedure Laterality Date  . Abdominal hysterectomy    . Laser eyes Bilateral     for glaucoma  . Thyroidectomy    . Breast surgery Bilateral     benign cysts  . Breast biopsy Right 04/11/2014    Procedure: RIGHT BREAST BIOPSY AFTER NEEDLE LOCALIZATION;  Surgeon: Jamesetta So, MD;  Location: [facility];  Service: General;  Laterality: Right;  NEEDLE LOC @ 8:00    Subjective Symptoms/Limitations Symptoms: Patient states she has been felling better , able to walk without feeling unsteady and feels that she is ready for discharge.  Pain Assessment Currently in Pain?: No/denies  Sensation/Coordination/Flexibility/Functional Tests Flexibility Thomas: Negative 90/90: Negative  Assessment RLE AROM (degrees) Right Hip Extension: 15 Right Hip Flexion: 120 (was 120) Right Knee Extension: 0 (was 0) Right Knee Flexion: 120 (was 120) Right Ankle Dorsiflexion: 20 (was 10) RLE Strength Right Hip Flexion:  (4+/5 ) Right Hip Extension: 4/5 Right Hip External Rotation : 50 Right Hip ABduction: 3+/5 Right Knee Flexion:  (4+/5) Right Knee Extension:  (4+/5) Right Ankle Dorsiflexion: 5/5  Exercise/Treatments Stretches Active Hamstring Stretch: 3 reps;30  seconds;Limitations Active Hamstring Stretch Limitations: standing on 14in box forward and same side rotation Piriformis Stretch: 3 reps;30 seconds Piriformis Stretch Limitations: seated 3 directions  Aerobic Stationary Bike: Nustep: 71mn Standing Functional Squats Limitations: Squat matrix with UE support 5x each Forward Lunge Limitations: 10x to 4" step Side Lunge Limitations: 10x to 4" step Other Standing Lumbar Exercises: walking 2D hip excursion 30 ft Sidelying Hip Abduction: 10 reps;Limitations Hip Abduction Limitations: therapist cuin to increase glut medius use.   Physical Therapy Assessment and Plan PT Assessment and Plan Clinical Impression Statement: Session focused on education of HEP to make sure patient could perform HEP independent. Patient Has made great progress towards all goals and is being discharged per patient request. expecting patient to achieve remainging goals with iperformance of HEP.  PT Plan: Patient discharged with HEP which was reviewed this session    Goals PT Short Term Goals PT Short Term Goal 1: Patient will demosnstrate increased hip internal rotaion mobility of 40 degrees indicatign improved deceleration of landing durign gait PT Short Term Goal 1 - Progress: Progressing toward goal PT Short Term Goal 2: Patient will increase hip extension to >10 degrees to increase stride length PT Short Term Goal 2 - Progress: Met PT Short Term Goal 3: Patient will demosntrate increased ankle dorsiflexion of 18 degrees to decrease early heel off during gait PT Short Term Goal 3 - Progress: Met PT Short Term Goal 4: Patient will demonstrates a negative pirfimornis mobility test with pain <2/10 indicatign improved ability to sit with pain <2/10 and  improved ability to cross her Rt ankkle over her Lt knee while sitting PT Short Term Goal 4 - Progress: Met PT Short Term Goal 5: Patient will display a negative obers test indicating adequate ITband mobility for gait.   PT Short Term Goal 5 - Progress: Met PT Long Term Goals PT Long Term Goal 1: Patient will demsontrate hip extension strength of 4/5 to be able to sit from the chair 5x in <15seconds indicating patient not at high risk of falls PT Long Term Goal 1 - Progress: Met PT Long Term Goal 2: Patient will be able to walk 94mnutes without and assitive device with pain <2/10 PT Long Term Goal 2 - Progress: Met Long Term Goal 3: Patient will demonstrate a TUG of <15 seconds indicatign patient is able to quickly stand up from a chair and walk without a risk of falling.  Long Term Goal 3 Progress: Met Long Term Goal 4: Patient will demonstrate hip abduction strength of 4/5 with MMT indicatign patient is able to stand one leg >10 seconds.  Long Term Goal 4 Progress: Partly met  Problem List Patient Active Problem List   Diagnosis Date Noted  . Abnormality of gait 06/21/2014  . Pain in joint, pelvic region and thigh 06/21/2014  . Stiffness of joint, not elsewhere classified, pelvic region and thigh 06/21/2014    PT - End of Session Activity Tolerance: Patient tolerated treatment well General Behavior During Therapy: WFL for tasks assessed/performed PT Plan of Care PT Home Exercise Plan: Given  GP Functional Assessment Tool Used: FOTO 47% limited, was 60% limited Functional Limitation: Mobility: Walking and moving around Mobility: Walking and Moving Around Current Status (([id number]: At least 40 percent but less than 60 percent impaired, limited or restricted Mobility: Walking and Moving Around Goal Status ([phone]: At least 40 percent but less than 60 percent impaired, limited or restricted Mobility: Walking and Moving Around Discharge Status [phone]: At least 40 percent but less than 60 percent impaired, limited or restricted  Tonilynn Bieker R 09/13/2014, 1:31 PM  Physician Documentation Your signature is required to indicate approval of the treatment plan as stated above.  Please sign and either  send electronically or make a copy of this report for your files and return this physician signed original.   Please mark one 1.__approve of plan  2. ___approve of plan with the following conditions.   ______________________________                                                          _____________________ Physician Signature                                                                                                             Date

## 2014-09-18 ENCOUNTER — Inpatient Hospital Stay (HOSPITAL_COMMUNITY): Admission: RE | Admit: 2014-09-18 | Payer: Medicare Other | Source: Ambulatory Visit | Admitting: Physical Therapy

## 2014-09-20 ENCOUNTER — Ambulatory Visit (HOSPITAL_COMMUNITY): Payer: Medicare Other

## 2014-09-25 ENCOUNTER — Ambulatory Visit (HOSPITAL_COMMUNITY): Payer: Medicare Other | Admitting: Physical Therapy

## 2014-09-27 ENCOUNTER — Ambulatory Visit (HOSPITAL_COMMUNITY): Payer: Medicare Other

## 2014-10-02 ENCOUNTER — Ambulatory Visit (HOSPITAL_COMMUNITY): Payer: Medicare Other | Admitting: Physical Therapy

## 2014-10-04 ENCOUNTER — Ambulatory Visit (HOSPITAL_COMMUNITY): Payer: Medicare Other

## 2014-10-09 ENCOUNTER — Ambulatory Visit (HOSPITAL_COMMUNITY): Payer: Medicare Other | Admitting: Physical Therapy

## 2014-10-11 ENCOUNTER — Ambulatory Visit (HOSPITAL_COMMUNITY): Payer: Medicare Other | Admitting: Physical Therapy

## 2015-01-21 DIAGNOSIS — N342 Other urethritis: Secondary | ICD-10-CM | POA: Diagnosis not present

## 2015-01-21 DIAGNOSIS — G894 Chronic pain syndrome: Secondary | ICD-10-CM | POA: Diagnosis not present

## 2015-01-21 DIAGNOSIS — E663 Overweight: Secondary | ICD-10-CM | POA: Diagnosis not present

## 2015-01-21 DIAGNOSIS — Z6827 Body mass index (BMI) 27.0-27.9, adult: Secondary | ICD-10-CM | POA: Diagnosis not present

## 2015-02-18 ENCOUNTER — Other Ambulatory Visit: Payer: Self-pay | Admitting: Family Medicine

## 2015-02-18 DIAGNOSIS — N644 Mastodynia: Secondary | ICD-10-CM

## 2015-02-18 DIAGNOSIS — Z9889 Other specified postprocedural states: Secondary | ICD-10-CM

## 2015-02-18 DIAGNOSIS — Z853 Personal history of malignant neoplasm of breast: Secondary | ICD-10-CM

## 2015-02-19 ENCOUNTER — Other Ambulatory Visit: Payer: Self-pay | Admitting: Family Medicine

## 2015-02-21 ENCOUNTER — Other Ambulatory Visit: Payer: Self-pay | Admitting: Family Medicine

## 2015-02-21 ENCOUNTER — Other Ambulatory Visit: Payer: Self-pay | Admitting: Physician Assistant

## 2015-02-21 DIAGNOSIS — R5381 Other malaise: Secondary | ICD-10-CM

## 2015-03-04 ENCOUNTER — Ambulatory Visit
Admission: RE | Admit: 2015-03-04 | Discharge: 2015-03-04 | Disposition: A | Discharge status: Home / Self Care | Payer: Medicare Other | Source: Ambulatory Visit | Attending: Family Medicine | Admitting: Family Medicine

## 2015-03-04 ENCOUNTER — Other Ambulatory Visit: Payer: Self-pay

## 2015-03-04 DIAGNOSIS — Z853 Personal history of malignant neoplasm of breast: Secondary | ICD-10-CM | POA: Diagnosis not present

## 2015-03-04 DIAGNOSIS — Z9889 Other specified postprocedural states: Secondary | ICD-10-CM

## 2015-03-04 DIAGNOSIS — N644 Mastodynia: Secondary | ICD-10-CM

## 2015-03-20 ENCOUNTER — Other Ambulatory Visit (HOSPITAL_COMMUNITY): Payer: Self-pay | Admitting: Family Medicine

## 2015-03-20 DIAGNOSIS — N342 Other urethritis: Secondary | ICD-10-CM | POA: Diagnosis not present

## 2015-03-20 DIAGNOSIS — R3919 Other difficulties with micturition: Secondary | ICD-10-CM | POA: Diagnosis not present

## 2015-03-20 DIAGNOSIS — E663 Overweight: Secondary | ICD-10-CM | POA: Diagnosis not present

## 2015-03-20 DIAGNOSIS — Z6826 Body mass index (BMI) 26.0-26.9, adult: Secondary | ICD-10-CM | POA: Diagnosis not present

## 2015-03-20 DIAGNOSIS — M1610 Unilateral primary osteoarthritis, unspecified hip: Secondary | ICD-10-CM

## 2015-03-21 DIAGNOSIS — E663 Overweight: Secondary | ICD-10-CM | POA: Diagnosis not present

## 2015-03-21 DIAGNOSIS — N342 Other urethritis: Secondary | ICD-10-CM | POA: Diagnosis not present

## 2015-03-21 DIAGNOSIS — E782 Mixed hyperlipidemia: Secondary | ICD-10-CM | POA: Diagnosis not present

## 2015-03-21 DIAGNOSIS — Z6826 Body mass index (BMI) 26.0-26.9, adult: Secondary | ICD-10-CM | POA: Diagnosis not present

## 2015-03-21 DIAGNOSIS — R3919 Other difficulties with micturition: Secondary | ICD-10-CM | POA: Diagnosis not present

## 2015-03-21 IMAGING — US US BREAST LTD UNI RIGHT INC AXILLA
1 series · 7 of 7 positions shown · non-contrast
Comparison: 04/11/2014 and prior mammograms dating back to
01/28/2009

CLINICAL DATA: 84-year-old female with history of right breast
lumpectomy for DCIS 4 months ago, subsequent infection and continued
low-grade fever -evaluate for source. Patient still complains of
discomfort in the upper-outer right breast.

EXAM:
DIGITAL DIAGNOSTIC  RIGHT MAMMOGRAM WITH CAD
ULTRASOUND RIGHT BREAST

[Series 1: us breast ltd uni right inc axilla · 0.06mm/px · 7 of 7 slices shown]
[im 1/7]
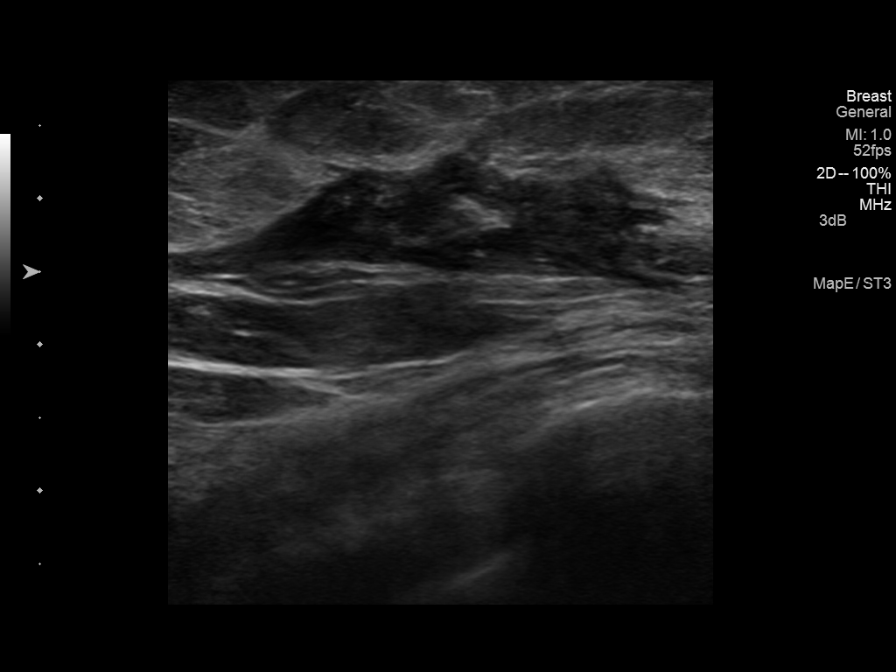
[im 2/7]
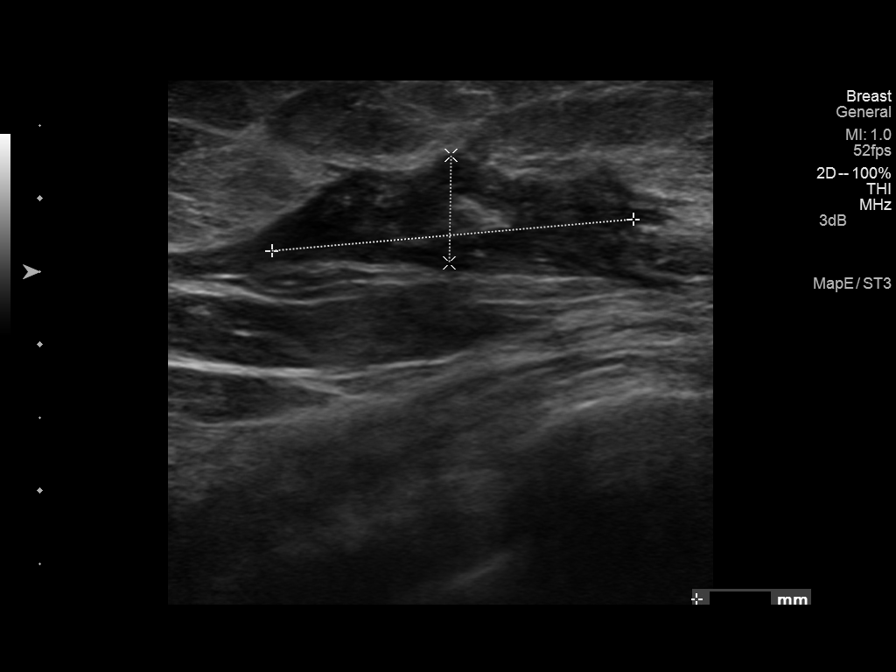
[im 3/7]
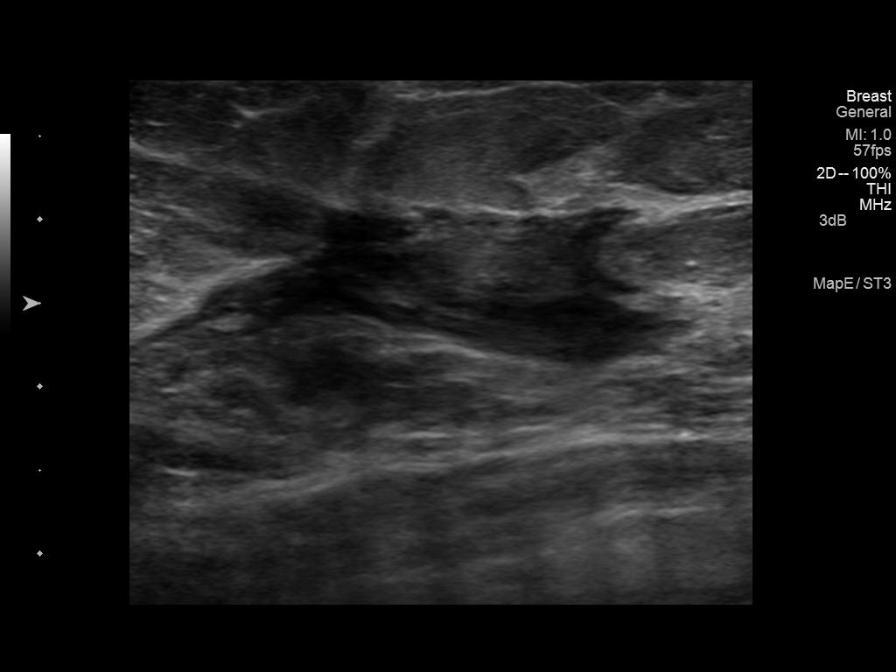
[im 4/7]
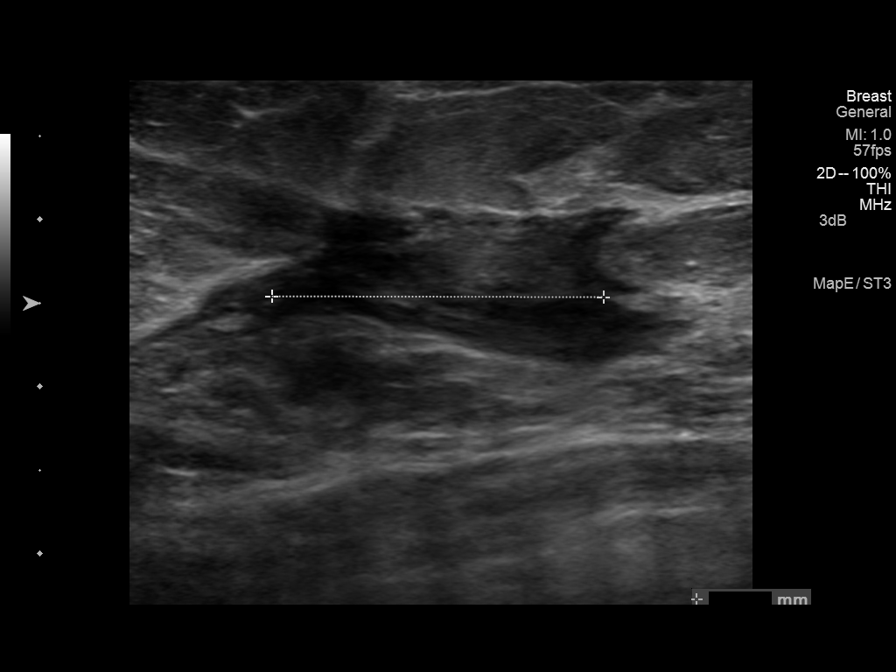
[im 5/7]
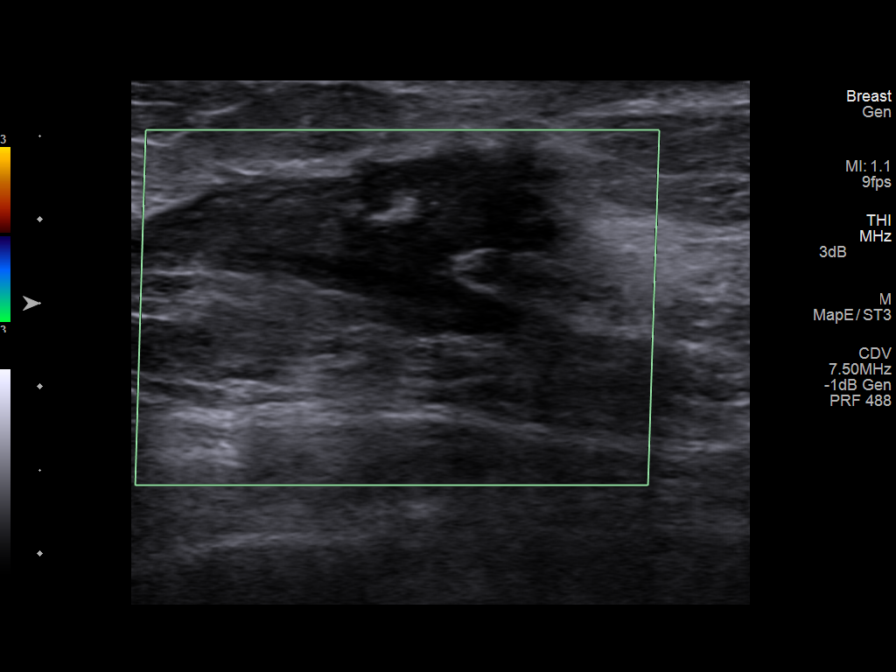
[im 6/7]
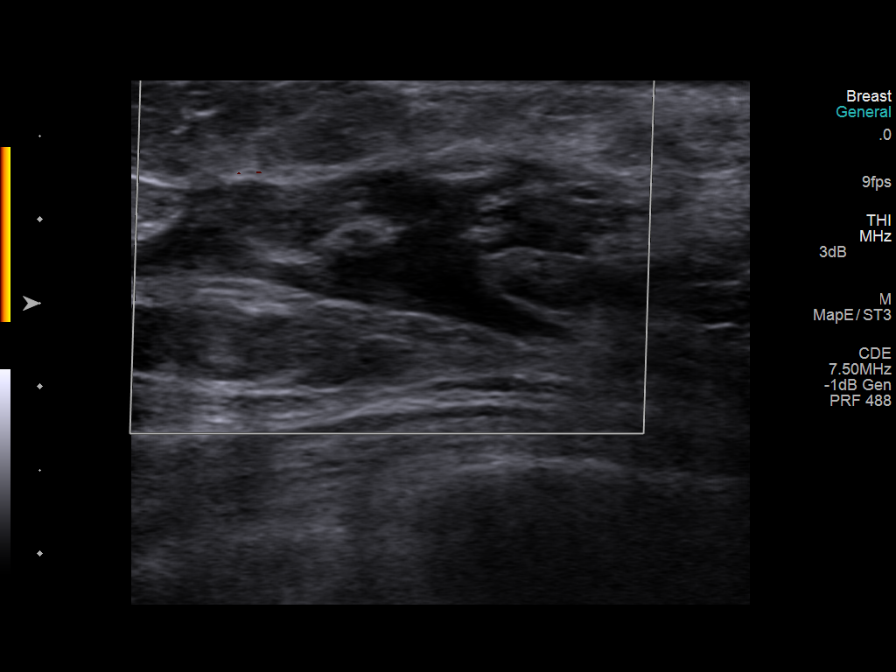
[im 7/7]
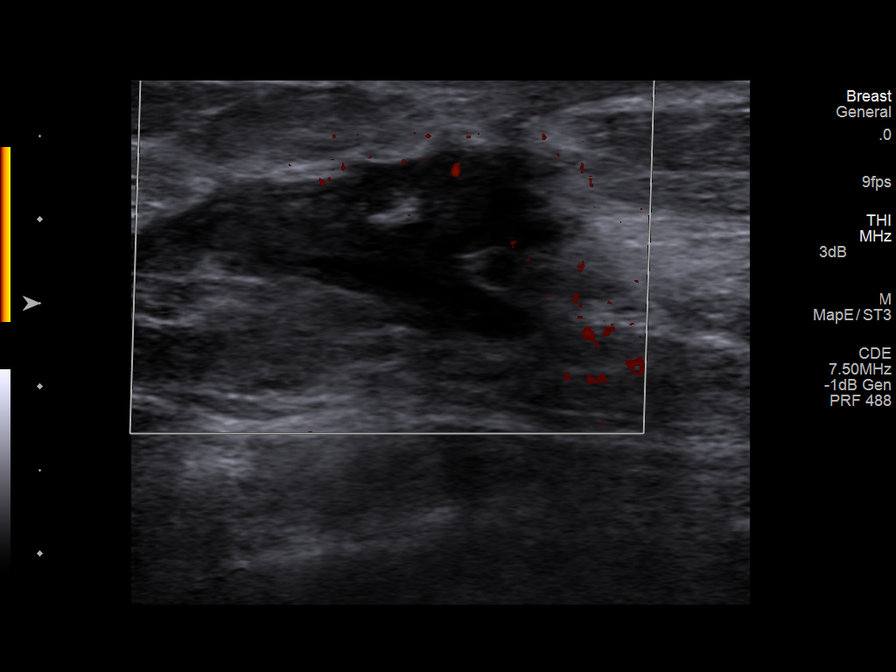

[7 of 7 positions shown; findings below may reference images not displayed]

ACR Breast Density Category b: There are scattered areas of
fibroglandular density.
FINDINGS: Routine views of the right breast demonstrate an area of increased
density/scarring within the upper-outer right breast.

No suspicious mass, nonsurgical distortion or worrisome
calcifications identified.

Mammographic images were processed with CAD.

On physical exam, mild thickening is identified at the 10 o'clock
position of the right breast underlying the patient's surgical scar.

Ultrasound is performed, showing a 0.7 x 2.5 x 2 cm ill-defined
hypoechoic area at the 10 o'clock position of the right breast 5 cm
from the nipple, underlying the surgical scar and may represent
postsurgical changes or possibly a complicated collection.
IMPRESSION: 0.7 x 2.5 x 2 cm ill-defined hypoechoic area at the 10 o'clock
position of the right breast 5 cm from the nipple compatible with
postsurgical changes or possibly a complicated collection. Consider
aspiration given this patient's continued low-grade fever and right
breast tenderness.

RECOMMENDATION:
Ultrasound-guided right breast aspiration attempt. This was
discussed with Zaina, nurse with Dr. Harris, who agrees with this
plan.

Bilateral diagnostic mammograms in February 2014 to resume annual
mammogram schedule.

I have discussed the findings and recommendations with the patient.
Results were also provided in writing at the conclusion of the
visit. If applicable, a reminder letter will be sent to the patient
regarding the next appointment.

BI-RADS CATEGORY  2: Benign.

## 2015-03-27 ENCOUNTER — Ambulatory Visit (HOSPITAL_COMMUNITY)
Admission: RE | Admit: 2015-03-27 | Discharge: 2015-03-27 | Disposition: A | Discharge status: Home / Self Care | Payer: Medicare Other | Source: Ambulatory Visit | Attending: Family Medicine | Admitting: Family Medicine

## 2015-03-27 DIAGNOSIS — Z780 Asymptomatic menopausal state: Secondary | ICD-10-CM | POA: Insufficient documentation

## 2015-03-27 DIAGNOSIS — M810 Age-related osteoporosis without current pathological fracture: Secondary | ICD-10-CM | POA: Diagnosis not present

## 2015-03-27 DIAGNOSIS — M1610 Unilateral primary osteoarthritis, unspecified hip: Secondary | ICD-10-CM

## 2015-03-27 DIAGNOSIS — M169 Osteoarthritis of hip, unspecified: Secondary | ICD-10-CM | POA: Diagnosis not present

## 2015-03-28 DIAGNOSIS — D225 Melanocytic nevi of trunk: Secondary | ICD-10-CM | POA: Diagnosis not present

## 2015-03-28 DIAGNOSIS — C44529 Squamous cell carcinoma of skin of other part of trunk: Secondary | ICD-10-CM | POA: Diagnosis not present

## 2015-03-28 DIAGNOSIS — D1801 Hemangioma of skin and subcutaneous tissue: Secondary | ICD-10-CM | POA: Diagnosis not present

## 2015-03-28 DIAGNOSIS — L821 Other seborrheic keratosis: Secondary | ICD-10-CM | POA: Diagnosis not present

## 2015-05-03 DIAGNOSIS — N952 Postmenopausal atrophic vaginitis: Secondary | ICD-10-CM | POA: Diagnosis not present

## 2015-05-03 DIAGNOSIS — N3001 Acute cystitis with hematuria: Secondary | ICD-10-CM | POA: Diagnosis not present

## 2015-05-03 DIAGNOSIS — R339 Retention of urine, unspecified: Secondary | ICD-10-CM | POA: Diagnosis not present

## 2015-06-12 DIAGNOSIS — Z6827 Body mass index (BMI) 27.0-27.9, adult: Secondary | ICD-10-CM | POA: Diagnosis not present

## 2015-06-12 DIAGNOSIS — E663 Overweight: Secondary | ICD-10-CM | POA: Diagnosis not present

## 2015-06-12 DIAGNOSIS — Z0000 Encounter for general adult medical examination without abnormal findings: Secondary | ICD-10-CM | POA: Diagnosis not present

## 2015-06-12 DIAGNOSIS — R7309 Other abnormal glucose: Secondary | ICD-10-CM | POA: Diagnosis not present

## 2015-06-18 ENCOUNTER — Ambulatory Visit (INDEPENDENT_AMBULATORY_CARE_PROVIDER_SITE_OTHER): Payer: Medicare Other | Admitting: Urology

## 2015-06-18 DIAGNOSIS — N3001 Acute cystitis with hematuria: Secondary | ICD-10-CM | POA: Diagnosis not present

## 2015-06-18 DIAGNOSIS — N952 Postmenopausal atrophic vaginitis: Secondary | ICD-10-CM

## 2015-07-12 DIAGNOSIS — J069 Acute upper respiratory infection, unspecified: Secondary | ICD-10-CM | POA: Diagnosis not present

## 2015-07-12 DIAGNOSIS — E663 Overweight: Secondary | ICD-10-CM | POA: Diagnosis not present

## 2015-07-12 DIAGNOSIS — Z6827 Body mass index (BMI) 27.0-27.9, adult: Secondary | ICD-10-CM | POA: Diagnosis not present

## 2015-07-12 DIAGNOSIS — Z1389 Encounter for screening for other disorder: Secondary | ICD-10-CM | POA: Diagnosis not present

## 2015-08-02 DIAGNOSIS — Z1389 Encounter for screening for other disorder: Secondary | ICD-10-CM | POA: Diagnosis not present

## 2015-08-02 DIAGNOSIS — G894 Chronic pain syndrome: Secondary | ICD-10-CM | POA: Diagnosis not present

## 2015-08-02 DIAGNOSIS — E663 Overweight: Secondary | ICD-10-CM | POA: Diagnosis not present

## 2015-08-02 DIAGNOSIS — Z6827 Body mass index (BMI) 27.0-27.9, adult: Secondary | ICD-10-CM | POA: Diagnosis not present

## 2015-08-06 DIAGNOSIS — H4011X1 Primary open-angle glaucoma, mild stage: Secondary | ICD-10-CM | POA: Diagnosis not present

## 2015-08-06 DIAGNOSIS — H4011X2 Primary open-angle glaucoma, moderate stage: Secondary | ICD-10-CM | POA: Diagnosis not present

## 2015-09-12 DIAGNOSIS — Z23 Encounter for immunization: Secondary | ICD-10-CM | POA: Diagnosis not present

## 2015-09-23 DIAGNOSIS — Z1389 Encounter for screening for other disorder: Secondary | ICD-10-CM | POA: Diagnosis not present

## 2015-09-23 DIAGNOSIS — E663 Overweight: Secondary | ICD-10-CM | POA: Diagnosis not present

## 2015-09-23 DIAGNOSIS — Z6828 Body mass index (BMI) 28.0-28.9, adult: Secondary | ICD-10-CM | POA: Diagnosis not present

## 2015-09-23 DIAGNOSIS — G894 Chronic pain syndrome: Secondary | ICD-10-CM | POA: Diagnosis not present

## 2015-11-26 DIAGNOSIS — G894 Chronic pain syndrome: Secondary | ICD-10-CM | POA: Diagnosis not present

## 2015-11-26 DIAGNOSIS — E663 Overweight: Secondary | ICD-10-CM | POA: Diagnosis not present

## 2015-11-26 DIAGNOSIS — Z1389 Encounter for screening for other disorder: Secondary | ICD-10-CM | POA: Diagnosis not present

## 2015-11-26 DIAGNOSIS — F419 Anxiety disorder, unspecified: Secondary | ICD-10-CM | POA: Diagnosis not present

## 2015-11-26 DIAGNOSIS — Z6828 Body mass index (BMI) 28.0-28.9, adult: Secondary | ICD-10-CM | POA: Diagnosis not present

## 2015-12-11 DIAGNOSIS — R05 Cough: Secondary | ICD-10-CM | POA: Diagnosis not present

## 2015-12-11 DIAGNOSIS — Z6828 Body mass index (BMI) 28.0-28.9, adult: Secondary | ICD-10-CM | POA: Diagnosis not present

## 2015-12-11 DIAGNOSIS — Z1389 Encounter for screening for other disorder: Secondary | ICD-10-CM | POA: Diagnosis not present

## 2015-12-11 DIAGNOSIS — E663 Overweight: Secondary | ICD-10-CM | POA: Diagnosis not present

## 2015-12-17 ENCOUNTER — Ambulatory Visit (INDEPENDENT_AMBULATORY_CARE_PROVIDER_SITE_OTHER): Payer: Medicare Other | Admitting: Urology

## 2015-12-17 DIAGNOSIS — N952 Postmenopausal atrophic vaginitis: Secondary | ICD-10-CM

## 2015-12-17 DIAGNOSIS — N3001 Acute cystitis with hematuria: Secondary | ICD-10-CM

## 2016-01-13 DIAGNOSIS — F419 Anxiety disorder, unspecified: Secondary | ICD-10-CM | POA: Diagnosis not present

## 2016-01-13 DIAGNOSIS — E039 Hypothyroidism, unspecified: Secondary | ICD-10-CM | POA: Diagnosis not present

## 2016-01-13 DIAGNOSIS — Z1389 Encounter for screening for other disorder: Secondary | ICD-10-CM | POA: Diagnosis not present

## 2016-01-13 DIAGNOSIS — G894 Chronic pain syndrome: Secondary | ICD-10-CM | POA: Diagnosis not present

## 2016-01-13 DIAGNOSIS — I10 Essential (primary) hypertension: Secondary | ICD-10-CM | POA: Diagnosis not present

## 2016-01-13 DIAGNOSIS — E782 Mixed hyperlipidemia: Secondary | ICD-10-CM | POA: Diagnosis not present

## 2016-01-29 ENCOUNTER — Other Ambulatory Visit: Payer: Self-pay | Admitting: Family Medicine

## 2016-01-29 DIAGNOSIS — Z853 Personal history of malignant neoplasm of breast: Secondary | ICD-10-CM

## 2016-01-29 DIAGNOSIS — Z9889 Other specified postprocedural states: Secondary | ICD-10-CM

## 2016-01-30 DIAGNOSIS — H524 Presbyopia: Secondary | ICD-10-CM | POA: Diagnosis not present

## 2016-01-30 DIAGNOSIS — H52223 Regular astigmatism, bilateral: Secondary | ICD-10-CM | POA: Diagnosis not present

## 2016-01-30 DIAGNOSIS — H401122 Primary open-angle glaucoma, left eye, moderate stage: Secondary | ICD-10-CM | POA: Diagnosis not present

## 2016-01-30 DIAGNOSIS — H5203 Hypermetropia, bilateral: Secondary | ICD-10-CM | POA: Diagnosis not present

## 2016-03-04 ENCOUNTER — Ambulatory Visit
Admission: RE | Admit: 2016-03-04 | Discharge: 2016-03-04 | Disposition: A | Discharge status: Home / Self Care | Payer: Medicare Other | Source: Ambulatory Visit | Attending: Family Medicine | Admitting: Family Medicine

## 2016-03-04 DIAGNOSIS — Z853 Personal history of malignant neoplasm of breast: Secondary | ICD-10-CM

## 2016-03-04 DIAGNOSIS — N644 Mastodynia: Secondary | ICD-10-CM | POA: Diagnosis not present

## 2016-03-04 DIAGNOSIS — Z9889 Other specified postprocedural states: Secondary | ICD-10-CM

## 2016-03-11 DIAGNOSIS — G894 Chronic pain syndrome: Secondary | ICD-10-CM | POA: Diagnosis not present

## 2016-03-11 DIAGNOSIS — N342 Other urethritis: Secondary | ICD-10-CM | POA: Diagnosis not present

## 2016-03-11 DIAGNOSIS — Z1389 Encounter for screening for other disorder: Secondary | ICD-10-CM | POA: Diagnosis not present

## 2016-03-11 DIAGNOSIS — R351 Nocturia: Secondary | ICD-10-CM | POA: Diagnosis not present

## 2016-05-15 DIAGNOSIS — Z1389 Encounter for screening for other disorder: Secondary | ICD-10-CM | POA: Diagnosis not present

## 2016-05-15 DIAGNOSIS — G894 Chronic pain syndrome: Secondary | ICD-10-CM | POA: Diagnosis not present

## 2016-06-03 DIAGNOSIS — Z08 Encounter for follow-up examination after completed treatment for malignant neoplasm: Secondary | ICD-10-CM | POA: Diagnosis not present

## 2016-06-03 DIAGNOSIS — C44712 Basal cell carcinoma of skin of right lower limb, including hip: Secondary | ICD-10-CM | POA: Diagnosis not present

## 2016-06-03 DIAGNOSIS — L820 Inflamed seborrheic keratosis: Secondary | ICD-10-CM | POA: Diagnosis not present

## 2016-06-03 DIAGNOSIS — C44719 Basal cell carcinoma of skin of left lower limb, including hip: Secondary | ICD-10-CM | POA: Diagnosis not present

## 2016-06-03 DIAGNOSIS — Z85828 Personal history of other malignant neoplasm of skin: Secondary | ICD-10-CM | POA: Diagnosis not present

## 2016-06-12 DIAGNOSIS — Z6829 Body mass index (BMI) 29.0-29.9, adult: Secondary | ICD-10-CM | POA: Diagnosis not present

## 2016-06-12 DIAGNOSIS — Z1389 Encounter for screening for other disorder: Secondary | ICD-10-CM | POA: Diagnosis not present

## 2016-06-12 DIAGNOSIS — E782 Mixed hyperlipidemia: Secondary | ICD-10-CM | POA: Diagnosis not present

## 2016-06-12 DIAGNOSIS — Z0001 Encounter for general adult medical examination with abnormal findings: Secondary | ICD-10-CM | POA: Diagnosis not present

## 2016-06-12 DIAGNOSIS — I10 Essential (primary) hypertension: Secondary | ICD-10-CM | POA: Diagnosis not present

## 2016-06-12 DIAGNOSIS — G894 Chronic pain syndrome: Secondary | ICD-10-CM | POA: Diagnosis not present

## 2016-06-12 DIAGNOSIS — R7309 Other abnormal glucose: Secondary | ICD-10-CM | POA: Diagnosis not present

## 2016-07-06 DIAGNOSIS — X32XXXD Exposure to sunlight, subsequent encounter: Secondary | ICD-10-CM | POA: Diagnosis not present

## 2016-07-06 DIAGNOSIS — L570 Actinic keratosis: Secondary | ICD-10-CM | POA: Diagnosis not present

## 2016-07-06 DIAGNOSIS — Z08 Encounter for follow-up examination after completed treatment for malignant neoplasm: Secondary | ICD-10-CM | POA: Diagnosis not present

## 2016-07-06 DIAGNOSIS — Z85828 Personal history of other malignant neoplasm of skin: Secondary | ICD-10-CM | POA: Diagnosis not present

## 2016-07-06 DIAGNOSIS — L821 Other seborrheic keratosis: Secondary | ICD-10-CM | POA: Diagnosis not present

## 2016-07-06 DIAGNOSIS — C44712 Basal cell carcinoma of skin of right lower limb, including hip: Secondary | ICD-10-CM | POA: Diagnosis not present

## 2016-07-06 DIAGNOSIS — C44719 Basal cell carcinoma of skin of left lower limb, including hip: Secondary | ICD-10-CM | POA: Diagnosis not present

## 2016-07-21 DIAGNOSIS — Z1389 Encounter for screening for other disorder: Secondary | ICD-10-CM | POA: Diagnosis not present

## 2016-07-21 DIAGNOSIS — G894 Chronic pain syndrome: Secondary | ICD-10-CM | POA: Diagnosis not present

## 2016-08-14 DIAGNOSIS — H401122 Primary open-angle glaucoma, left eye, moderate stage: Secondary | ICD-10-CM | POA: Diagnosis not present

## 2016-08-14 DIAGNOSIS — H401111 Primary open-angle glaucoma, right eye, mild stage: Secondary | ICD-10-CM | POA: Diagnosis not present

## 2016-09-21 DIAGNOSIS — G894 Chronic pain syndrome: Secondary | ICD-10-CM | POA: Diagnosis not present

## 2016-09-21 DIAGNOSIS — R8290 Unspecified abnormal findings in urine: Secondary | ICD-10-CM | POA: Diagnosis not present

## 2016-09-21 DIAGNOSIS — Z1389 Encounter for screening for other disorder: Secondary | ICD-10-CM | POA: Diagnosis not present

## 2016-10-01 DIAGNOSIS — Z08 Encounter for follow-up examination after completed treatment for malignant neoplasm: Secondary | ICD-10-CM | POA: Diagnosis not present

## 2016-10-01 DIAGNOSIS — C44719 Basal cell carcinoma of skin of left lower limb, including hip: Secondary | ICD-10-CM | POA: Diagnosis not present

## 2016-10-01 DIAGNOSIS — Z85828 Personal history of other malignant neoplasm of skin: Secondary | ICD-10-CM | POA: Diagnosis not present

## 2016-10-30 DIAGNOSIS — Z1389 Encounter for screening for other disorder: Secondary | ICD-10-CM | POA: Diagnosis not present

## 2016-10-30 DIAGNOSIS — G894 Chronic pain syndrome: Secondary | ICD-10-CM | POA: Diagnosis not present

## 2016-10-30 DIAGNOSIS — M1991 Primary osteoarthritis, unspecified site: Secondary | ICD-10-CM | POA: Diagnosis not present

## 2016-11-27 DIAGNOSIS — J069 Acute upper respiratory infection, unspecified: Secondary | ICD-10-CM | POA: Diagnosis not present

## 2016-11-27 DIAGNOSIS — G894 Chronic pain syndrome: Secondary | ICD-10-CM | POA: Diagnosis not present

## 2016-11-27 DIAGNOSIS — M1991 Primary osteoarthritis, unspecified site: Secondary | ICD-10-CM | POA: Diagnosis not present

## 2016-11-27 DIAGNOSIS — Z1389 Encounter for screening for other disorder: Secondary | ICD-10-CM | POA: Diagnosis not present

## 2016-12-02 DIAGNOSIS — Z85828 Personal history of other malignant neoplasm of skin: Secondary | ICD-10-CM | POA: Diagnosis not present

## 2016-12-02 DIAGNOSIS — Z08 Encounter for follow-up examination after completed treatment for malignant neoplasm: Secondary | ICD-10-CM | POA: Diagnosis not present

## 2016-12-03 DIAGNOSIS — J069 Acute upper respiratory infection, unspecified: Secondary | ICD-10-CM | POA: Diagnosis not present

## 2016-12-03 DIAGNOSIS — Z1389 Encounter for screening for other disorder: Secondary | ICD-10-CM | POA: Diagnosis not present

## 2017-01-12 ENCOUNTER — Ambulatory Visit (INDEPENDENT_AMBULATORY_CARE_PROVIDER_SITE_OTHER): Payer: Medicare Other | Admitting: Urology

## 2017-01-12 DIAGNOSIS — N952 Postmenopausal atrophic vaginitis: Secondary | ICD-10-CM

## 2017-01-12 DIAGNOSIS — R3914 Feeling of incomplete bladder emptying: Secondary | ICD-10-CM

## 2017-01-26 ENCOUNTER — Other Ambulatory Visit: Payer: Self-pay | Admitting: Family Medicine

## 2017-01-26 DIAGNOSIS — Z853 Personal history of malignant neoplasm of breast: Secondary | ICD-10-CM

## 2017-02-02 DIAGNOSIS — H401122 Primary open-angle glaucoma, left eye, moderate stage: Secondary | ICD-10-CM | POA: Diagnosis not present

## 2017-02-02 DIAGNOSIS — H401111 Primary open-angle glaucoma, right eye, mild stage: Secondary | ICD-10-CM | POA: Diagnosis not present

## 2017-03-08 ENCOUNTER — Ambulatory Visit
Admission: RE | Admit: 2017-03-08 | Discharge: 2017-03-08 | Disposition: A | Discharge status: Home / Self Care | Payer: Medicare Other | Source: Ambulatory Visit | Attending: Family Medicine | Admitting: Family Medicine

## 2017-03-08 DIAGNOSIS — Z853 Personal history of malignant neoplasm of breast: Secondary | ICD-10-CM

## 2017-03-08 DIAGNOSIS — R928 Other abnormal and inconclusive findings on diagnostic imaging of breast: Secondary | ICD-10-CM | POA: Diagnosis not present

## 2017-03-09 DIAGNOSIS — E782 Mixed hyperlipidemia: Secondary | ICD-10-CM | POA: Diagnosis not present

## 2017-03-09 DIAGNOSIS — R7309 Other abnormal glucose: Secondary | ICD-10-CM | POA: Diagnosis not present

## 2017-03-09 DIAGNOSIS — G894 Chronic pain syndrome: Secondary | ICD-10-CM | POA: Diagnosis not present

## 2017-03-09 DIAGNOSIS — I10 Essential (primary) hypertension: Secondary | ICD-10-CM | POA: Diagnosis not present

## 2017-03-09 DIAGNOSIS — M810 Age-related osteoporosis without current pathological fracture: Secondary | ICD-10-CM | POA: Diagnosis not present

## 2017-03-09 DIAGNOSIS — Z1389 Encounter for screening for other disorder: Secondary | ICD-10-CM | POA: Diagnosis not present

## 2017-05-12 DIAGNOSIS — G894 Chronic pain syndrome: Secondary | ICD-10-CM | POA: Diagnosis not present

## 2017-05-12 DIAGNOSIS — Z1389 Encounter for screening for other disorder: Secondary | ICD-10-CM | POA: Diagnosis not present

## 2017-06-07 ENCOUNTER — Other Ambulatory Visit (HOSPITAL_COMMUNITY): Payer: Self-pay | Admitting: Family Medicine

## 2017-06-07 ENCOUNTER — Ambulatory Visit (HOSPITAL_COMMUNITY)
Admission: RE | Admit: 2017-06-07 | Discharge: 2017-06-07 | Disposition: A | Discharge status: Home / Self Care | Payer: Medicare Other | Source: Ambulatory Visit | Attending: Family Medicine | Admitting: Family Medicine

## 2017-06-07 DIAGNOSIS — M25511 Pain in right shoulder: Secondary | ICD-10-CM | POA: Insufficient documentation

## 2017-06-07 DIAGNOSIS — M19011 Primary osteoarthritis, right shoulder: Secondary | ICD-10-CM | POA: Insufficient documentation

## 2017-06-18 DIAGNOSIS — M25511 Pain in right shoulder: Secondary | ICD-10-CM | POA: Diagnosis not present

## 2017-06-18 DIAGNOSIS — M19011 Primary osteoarthritis, right shoulder: Secondary | ICD-10-CM | POA: Diagnosis not present

## 2017-08-19 DIAGNOSIS — R350 Frequency of micturition: Secondary | ICD-10-CM | POA: Diagnosis not present

## 2017-08-19 DIAGNOSIS — G894 Chronic pain syndrome: Secondary | ICD-10-CM | POA: Diagnosis not present

## 2017-08-19 DIAGNOSIS — N342 Other urethritis: Secondary | ICD-10-CM | POA: Diagnosis not present

## 2017-09-14 DIAGNOSIS — Z23 Encounter for immunization: Secondary | ICD-10-CM | POA: Diagnosis not present

## 2017-09-29 DIAGNOSIS — H401111 Primary open-angle glaucoma, right eye, mild stage: Secondary | ICD-10-CM | POA: Diagnosis not present

## 2017-09-29 DIAGNOSIS — H401122 Primary open-angle glaucoma, left eye, moderate stage: Secondary | ICD-10-CM | POA: Diagnosis not present

## 2017-10-13 DIAGNOSIS — R350 Frequency of micturition: Secondary | ICD-10-CM | POA: Diagnosis not present

## 2017-10-13 DIAGNOSIS — N390 Urinary tract infection, site not specified: Secondary | ICD-10-CM | POA: Diagnosis not present

## 2017-10-13 DIAGNOSIS — N342 Other urethritis: Secondary | ICD-10-CM | POA: Diagnosis not present

## 2017-10-20 DIAGNOSIS — G894 Chronic pain syndrome: Secondary | ICD-10-CM | POA: Diagnosis not present

## 2017-10-20 DIAGNOSIS — Z1389 Encounter for screening for other disorder: Secondary | ICD-10-CM | POA: Diagnosis not present

## 2017-10-20 DIAGNOSIS — N342 Other urethritis: Secondary | ICD-10-CM | POA: Diagnosis not present

## 2017-10-22 DIAGNOSIS — M169 Osteoarthritis of hip, unspecified: Secondary | ICD-10-CM | POA: Diagnosis not present

## 2017-10-22 DIAGNOSIS — G894 Chronic pain syndrome: Secondary | ICD-10-CM | POA: Diagnosis not present

## 2017-11-12 DIAGNOSIS — M810 Age-related osteoporosis without current pathological fracture: Secondary | ICD-10-CM | POA: Diagnosis not present

## 2017-11-12 DIAGNOSIS — E782 Mixed hyperlipidemia: Secondary | ICD-10-CM | POA: Diagnosis not present

## 2017-11-12 DIAGNOSIS — Z0001 Encounter for general adult medical examination with abnormal findings: Secondary | ICD-10-CM | POA: Diagnosis not present

## 2017-11-12 DIAGNOSIS — Z1389 Encounter for screening for other disorder: Secondary | ICD-10-CM | POA: Diagnosis not present

## 2017-11-12 DIAGNOSIS — R7309 Other abnormal glucose: Secondary | ICD-10-CM | POA: Diagnosis not present

## 2017-11-12 DIAGNOSIS — K5730 Diverticulosis of large intestine without perforation or abscess without bleeding: Secondary | ICD-10-CM | POA: Diagnosis not present

## 2017-11-12 DIAGNOSIS — E559 Vitamin D deficiency, unspecified: Secondary | ICD-10-CM | POA: Diagnosis not present

## 2017-12-24 DIAGNOSIS — G894 Chronic pain syndrome: Secondary | ICD-10-CM | POA: Diagnosis not present

## 2018-01-27 DIAGNOSIS — R7309 Other abnormal glucose: Secondary | ICD-10-CM | POA: Diagnosis not present

## 2018-02-03 DIAGNOSIS — Z1389 Encounter for screening for other disorder: Secondary | ICD-10-CM | POA: Diagnosis not present

## 2018-02-03 DIAGNOSIS — G894 Chronic pain syndrome: Secondary | ICD-10-CM | POA: Diagnosis not present

## 2018-03-17 ENCOUNTER — Other Ambulatory Visit: Payer: Self-pay | Admitting: Family Medicine

## 2018-03-17 DIAGNOSIS — Z139 Encounter for screening, unspecified: Secondary | ICD-10-CM

## 2018-03-17 DIAGNOSIS — Z853 Personal history of malignant neoplasm of breast: Secondary | ICD-10-CM

## 2018-03-21 ENCOUNTER — Other Ambulatory Visit: Payer: Self-pay | Admitting: Family Medicine

## 2018-03-21 DIAGNOSIS — E2839 Other primary ovarian failure: Secondary | ICD-10-CM

## 2018-03-22 ENCOUNTER — Ambulatory Visit
Admission: RE | Admit: 2018-03-22 | Discharge: 2018-03-22 | Disposition: A | Discharge status: Home / Self Care | Payer: Medicare Other | Source: Ambulatory Visit | Attending: Family Medicine | Admitting: Family Medicine

## 2018-03-22 DIAGNOSIS — R928 Other abnormal and inconclusive findings on diagnostic imaging of breast: Secondary | ICD-10-CM | POA: Diagnosis not present

## 2018-03-22 DIAGNOSIS — Z853 Personal history of malignant neoplasm of breast: Secondary | ICD-10-CM

## 2018-03-25 DIAGNOSIS — G894 Chronic pain syndrome: Secondary | ICD-10-CM | POA: Diagnosis not present

## 2018-03-28 DIAGNOSIS — H401111 Primary open-angle glaucoma, right eye, mild stage: Secondary | ICD-10-CM | POA: Diagnosis not present

## 2018-03-28 DIAGNOSIS — H401122 Primary open-angle glaucoma, left eye, moderate stage: Secondary | ICD-10-CM | POA: Diagnosis not present

## 2018-04-18 ENCOUNTER — Ambulatory Visit
Admission: RE | Admit: 2018-04-18 | Discharge: 2018-04-18 | Disposition: A | Discharge status: Home / Self Care | Payer: Medicare Other | Source: Ambulatory Visit | Attending: Family Medicine | Admitting: Family Medicine

## 2018-04-18 DIAGNOSIS — E2839 Other primary ovarian failure: Secondary | ICD-10-CM

## 2018-05-06 DIAGNOSIS — G894 Chronic pain syndrome: Secondary | ICD-10-CM | POA: Diagnosis not present

## 2018-06-06 DIAGNOSIS — R7309 Other abnormal glucose: Secondary | ICD-10-CM | POA: Diagnosis not present

## 2018-06-06 DIAGNOSIS — Z1389 Encounter for screening for other disorder: Secondary | ICD-10-CM | POA: Diagnosis not present

## 2018-06-06 DIAGNOSIS — Z0001 Encounter for general adult medical examination with abnormal findings: Secondary | ICD-10-CM | POA: Diagnosis not present

## 2018-06-06 DIAGNOSIS — E559 Vitamin D deficiency, unspecified: Secondary | ICD-10-CM | POA: Diagnosis not present

## 2018-06-06 DIAGNOSIS — E039 Hypothyroidism, unspecified: Secondary | ICD-10-CM | POA: Diagnosis not present

## 2018-06-06 DIAGNOSIS — E782 Mixed hyperlipidemia: Secondary | ICD-10-CM | POA: Diagnosis not present

## 2018-06-06 DIAGNOSIS — I10 Essential (primary) hypertension: Secondary | ICD-10-CM | POA: Diagnosis not present

## 2018-06-06 DIAGNOSIS — G894 Chronic pain syndrome: Secondary | ICD-10-CM | POA: Diagnosis not present

## 2018-06-06 DIAGNOSIS — E785 Hyperlipidemia, unspecified: Secondary | ICD-10-CM | POA: Diagnosis not present

## 2018-06-28 DIAGNOSIS — G894 Chronic pain syndrome: Secondary | ICD-10-CM | POA: Diagnosis not present

## 2018-07-19 DIAGNOSIS — T148XXA Other injury of unspecified body region, initial encounter: Secondary | ICD-10-CM | POA: Diagnosis not present

## 2018-07-19 DIAGNOSIS — G894 Chronic pain syndrome: Secondary | ICD-10-CM | POA: Diagnosis not present

## 2018-08-16 DIAGNOSIS — R1084 Generalized abdominal pain: Secondary | ICD-10-CM | POA: Diagnosis not present

## 2018-08-16 DIAGNOSIS — K5289 Other specified noninfective gastroenteritis and colitis: Secondary | ICD-10-CM | POA: Diagnosis not present

## 2018-08-24 ENCOUNTER — Other Ambulatory Visit (HOSPITAL_COMMUNITY): Payer: Medicare Other

## 2018-08-31 DIAGNOSIS — M25561 Pain in right knee: Secondary | ICD-10-CM | POA: Diagnosis not present

## 2018-08-31 DIAGNOSIS — G894 Chronic pain syndrome: Secondary | ICD-10-CM | POA: Diagnosis not present

## 2018-08-31 DIAGNOSIS — M1991 Primary osteoarthritis, unspecified site: Secondary | ICD-10-CM | POA: Diagnosis not present

## 2018-08-31 DIAGNOSIS — M810 Age-related osteoporosis without current pathological fracture: Secondary | ICD-10-CM | POA: Diagnosis not present

## 2018-09-17 ENCOUNTER — Emergency Department (HOSPITAL_COMMUNITY): Payer: Medicare Other

## 2018-09-17 ENCOUNTER — Emergency Department (HOSPITAL_COMMUNITY)
Admission: EM | Admit: 2018-09-17 | Discharge: 2018-09-17 | Disposition: A | Discharge status: Home / Self Care | Payer: Medicare Other | Attending: Emergency Medicine | Admitting: Emergency Medicine

## 2018-09-17 ENCOUNTER — Other Ambulatory Visit: Payer: Self-pay

## 2018-09-17 ENCOUNTER — Encounter (HOSPITAL_COMMUNITY): Payer: Self-pay | Admitting: Emergency Medicine

## 2018-09-17 DIAGNOSIS — Y999 Unspecified external cause status: Secondary | ICD-10-CM | POA: Insufficient documentation

## 2018-09-17 DIAGNOSIS — Y939 Activity, unspecified: Secondary | ICD-10-CM | POA: Diagnosis not present

## 2018-09-17 DIAGNOSIS — I10 Essential (primary) hypertension: Secondary | ICD-10-CM | POA: Insufficient documentation

## 2018-09-17 DIAGNOSIS — Z743 Need for continuous supervision: Secondary | ICD-10-CM | POA: Diagnosis not present

## 2018-09-17 DIAGNOSIS — Z79899 Other long term (current) drug therapy: Secondary | ICD-10-CM | POA: Diagnosis not present

## 2018-09-17 DIAGNOSIS — W11XXXA Fall on and from ladder, initial encounter: Secondary | ICD-10-CM | POA: Diagnosis not present

## 2018-09-17 DIAGNOSIS — S32402A Unspecified fracture of left acetabulum, initial encounter for closed fracture: Secondary | ICD-10-CM | POA: Diagnosis not present

## 2018-09-17 DIAGNOSIS — R279 Unspecified lack of coordination: Secondary | ICD-10-CM | POA: Diagnosis not present

## 2018-09-17 DIAGNOSIS — Z7982 Long term (current) use of aspirin: Secondary | ICD-10-CM | POA: Insufficient documentation

## 2018-09-17 DIAGNOSIS — R52 Pain, unspecified: Secondary | ICD-10-CM | POA: Diagnosis not present

## 2018-09-17 DIAGNOSIS — S32415A Nondisplaced fracture of anterior wall of left acetabulum, initial encounter for closed fracture: Secondary | ICD-10-CM | POA: Diagnosis not present

## 2018-09-17 DIAGNOSIS — E039 Hypothyroidism, unspecified: Secondary | ICD-10-CM | POA: Insufficient documentation

## 2018-09-17 DIAGNOSIS — Y929 Unspecified place or not applicable: Secondary | ICD-10-CM | POA: Diagnosis not present

## 2018-09-17 DIAGNOSIS — S79912A Unspecified injury of left hip, initial encounter: Secondary | ICD-10-CM | POA: Diagnosis not present

## 2018-09-17 DIAGNOSIS — M25552 Pain in left hip: Secondary | ICD-10-CM | POA: Diagnosis not present

## 2018-09-17 DIAGNOSIS — R5381 Other malaise: Secondary | ICD-10-CM | POA: Diagnosis not present

## 2018-09-17 LAB — CBC WITH DIFFERENTIAL/PLATELET
BASOS PCT: 0 %
Basophils Absolute: 0 10*3/uL (ref 0.0–0.1)
Eosinophils Absolute: 0 10*3/uL (ref 0.0–0.7)
Eosinophils Relative: 0 %
HEMATOCRIT: 38.3 % (ref 36.0–46.0)
Hemoglobin: 12.4 g/dL (ref 12.0–15.0)
Lymphocytes Relative: 8 %
Lymphs Abs: 1 10*3/uL (ref 0.7–4.0)
MCH: 29.1 pg (ref 26.0–34.0)
MCHC: 32.4 g/dL (ref 30.0–36.0)
MCV: 89.9 fL (ref 78.0–100.0)
MONO ABS: 1.6 10*3/uL — AB (ref 0.1–1.0)
MONOS PCT: 13 %
NEUTROS ABS: 10.1 10*3/uL — AB (ref 1.7–7.7)
Neutrophils Relative %: 79 %
Platelets: 202 10*3/uL (ref 150–400)
RBC: 4.26 MIL/uL (ref 3.87–5.11)
RDW: 13.4 % (ref 11.5–15.5)
WBC: 12.7 10*3/uL — ABNORMAL HIGH (ref 4.0–10.5)

## 2018-09-17 LAB — COMPREHENSIVE METABOLIC PANEL
ALBUMIN: 3.7 g/dL (ref 3.5–5.0)
ALT: 32 U/L (ref 0–44)
AST: 17 U/L (ref 15–41)
Alkaline Phosphatase: 39 U/L (ref 38–126)
Anion gap: 9 (ref 5–15)
BILIRUBIN TOTAL: 0.9 mg/dL (ref 0.3–1.2)
BUN: 15 mg/dL (ref 8–23)
CO2: 26 mmol/L (ref 22–32)
Calcium: 8.5 mg/dL — ABNORMAL LOW (ref 8.9–10.3)
Chloride: 106 mmol/L (ref 98–111)
Creatinine, Ser: 0.64 mg/dL (ref 0.44–1.00)
GFR calc Af Amer: >60 mL/min (ref >60)
GFR calc non Af Amer: >60 mL/min (ref >60)
Glucose, Bld: 123 mg/dL — ABNORMAL HIGH (ref 70–99)
POTASSIUM: 4.1 mmol/L (ref 3.5–5.1)
Sodium: 141 mmol/L (ref 135–145)
TOTAL PROTEIN: 6.4 g/dL — AB (ref 6.5–8.1)

## 2018-09-17 MED ORDER — HYDROCODONE-HOMATROPINE 5-1.5 MG/5ML PO SYRP: 5.0000 mL | ORAL_SOLUTION | Freq: Four times a day (QID) | ORAL | 0 refills | Status: DC | PRN

## 2018-09-17 MED ORDER — DEXAMETHASONE 4 MG PO TABS: 4.0000 mg | ORAL_TABLET | Freq: Two times a day (BID) | ORAL | 0 refills | Status: DC

## 2018-09-17 NOTE — ED Provider Notes (Signed)
[facility] EMERGENCY DEPARTMENT Provider Note   CSN: [id number] Arrival date & time: 09/17/18  1010     History   Chief Complaint Chief Complaint  Patient presents with  . Hip Pain    HPI Kelsey Ruiz is a 84 y.o. female.  Patient was brought to the emergency department by EMS because of a fall on yesterday.  The patient missed a step on a stepladder and injured her left hip.  She is unable to bear weight on the hip so she came to the emergency department for evaluation.  The history is provided by the patient.  Hip Pain  This is a new problem. The current episode started yesterday. The problem has been gradually worsening. Pertinent negatives include no chest pain, no abdominal pain, no headaches and no shortness of breath. Exacerbated by: standing or applying weight. The symptoms are relieved by medications. She has tried acetaminophen (norco and prednisone) for the symptoms. The treatment provided mild relief.    Past Medical History:  Diagnosis Date  . Arthritis   . GERD (gastroesophageal reflux disease)   . Glaucoma   . Hyperlipemia   . Hypertension   . Hypothyroidism     Patient Active Problem List   Diagnosis Date Noted  . Abnormality of gait 06/21/2014  . Pain in joint, pelvic region and thigh 06/21/2014  . Stiffness of joint, not elsewhere classified, pelvic region and thigh 06/21/2014    Past Surgical History:  Procedure Laterality Date  . ABDOMINAL HYSTERECTOMY    . BREAST BIOPSY Right 04/11/2014   Procedure: RIGHT BREAST BIOPSY AFTER NEEDLE LOCALIZATION;  Surgeon: Jamesetta So, MD;  Location: [facility];  Service: General;  Laterality: Right;  NEEDLE LOC @ 8:00  . BREAST LUMPECTOMY Right   . BREAST SURGERY Bilateral    benign cysts  . laser eyes Bilateral    for glaucoma  . THYROIDECTOMY       OB History   None      Home Medications    Prior to Admission medications   Medication Sig Start Date End Date Taking? Authorizing Provider    alendronate (FOSAMAX) 70 MG tablet Take 70 mg by mouth once a week. Take with a full glass of water on an empty stomach. Take on Saturday.    [provider]  aspirin EC 325 MG tablet Take 325 mg by mouth every morning.    [provider]  atorvastatin (LIPITOR) 40 MG tablet Take 40 mg by mouth every evening.     [provider]  Calcium-Magnesium-Zinc (CAL-MAG-ZINC PO) Take 1 tablet by mouth every morning.     [provider]  cholecalciferol (VITAMIN D) 1000 UNITS tablet Take 1,000 Units by mouth every morning.     [provider]  Coenzyme Q10 200 MG capsule Take 200 mg by mouth every morning.     [provider]  HYDROcodone-acetaminophen (NORCO/VICODIN) 5-325 MG per tablet Take 0.5-1 tablets by mouth every 6 (six) hours as needed for moderate pain.     [provider]  levothyroxine (SYNTHROID, LEVOTHROID) 75 MCG tablet Take 75 mcg by mouth daily before breakfast.    [provider]  meloxicam (MOBIC) 15 MG tablet Take 15 mg by mouth daily.    [provider]  omeprazole (PRILOSEC) 40 MG capsule Take 40 mg by mouth every morning.     [provider]  phenazopyridine (PYRIDIUM) 200 MG tablet Take 1 tablet (200 mg total) by mouth 3 (three) times  daily as needed (pain on urination). 07/07/14   Rolland Porter, MD  phenazopyridine (PYRIDIUM) 200 MG tablet Take 1 tablet (200 mg total) by mouth 3 (three) times daily as needed (pain on urination). 07/07/14   Rolland Porter, MD  sulfamethoxazole-trimethoprim (BACTRIM DS) 800-160 MG per tablet Take 1 tablet by mouth 2 (two) times daily. 07/07/14   Rolland Porter, MD  sulfamethoxazole-trimethoprim (SEPTRA DS) 800-160 MG per tablet Take 1 tablet by mouth 2 (two) times daily. 07/07/14   Rolland Porter, MD  traMADol (ULTRAM) 50 MG tablet Take 50 mg by mouth 3 (three) times daily as needed. For pan 05/08/14   [provider]  vitamin B-12 (CYANOCOBALAMIN) 100 MCG tablet Take 100 mcg  by mouth every morning.     [provider]  vitamin C (ASCORBIC ACID) 500 MG tablet Take 500 mg by mouth every morning.     [provider]    Family History Family History  Problem Relation Age of Onset  . Breast cancer Neg Hx     Social History Social History   Tobacco Use  . Smoking status: Former Smoker    Packs/day: 0.50    Years: 10.00    Pack years: 5.00    Types: Cigarettes    Last attempt to quit: 04/05/1984    Years since quitting: 34.4  . Smokeless tobacco: Never Used  Substance Use Topics  . Alcohol use: No  . Drug use: No     Allergies   Sulfa antibiotics   Review of Systems Review of Systems  Respiratory: Negative for shortness of breath.   Cardiovascular: Negative for chest pain.  Gastrointestinal: Negative for abdominal pain.  Neurological: Negative for headaches.     Physical Exam Updated Vital Signs BP (!) 142/95 (BP Location: Left Arm)   Pulse 66   Temp 98.3 F (36.8 C) (Oral)   Resp 15   Ht 5\' 2"  (1.575 m)   Wt 64.4 kg   SpO2 98%   BMI 25.97 kg/m   Physical Exam  Constitutional: She is oriented to person, place, and time. She appears well-developed and well-nourished.  Non-toxic appearance.  HENT:  Head: Normocephalic.  Right Ear: Tympanic membrane and external ear normal.  Left Ear: Tympanic membrane and external ear normal.  Eyes: Pupils are equal, round, and reactive to light. EOM and lids are normal.  Neck: Normal range of motion. Neck supple. Carotid bruit is not present.  Cardiovascular: Normal rate, regular rhythm, normal heart sounds, intact distal pulses and normal pulses.  Pulmonary/Chest: Breath sounds normal. No respiratory distress.  Abdominal: Soft. Bowel sounds are normal. There is no tenderness. There is no guarding.  Musculoskeletal:       Left hip: She exhibits decreased range of motion, tenderness and bony tenderness.  Lymphadenopathy:       Head (right side): No submandibular adenopathy  present.       Head (left side): No submandibular adenopathy present.    She has no cervical adenopathy.  Neurological: She is alert and oriented to person, place, and time. She has normal strength. No cranial nerve deficit or sensory deficit.  Skin: Skin is warm and dry.  Psychiatric: She has a normal mood and affect. Her speech is normal.  Nursing note and vitals reviewed.    ED Treatments / Results  Labs (all labs ordered are listed, but only abnormal results are displayed) Labs Reviewed - No data to display  EKG None  Radiology No results found.  Procedures Procedures (including  critical care time)  Medications Ordered in ED Medications - No data to display   Initial Impression / Assessment and Plan / ED Course  I have reviewed the triage vital signs and the nursing notes.  Pertinent labs & imaging results that were available during my care of the patient were reviewed by me and considered in my medical decision making (see chart for details).  Clinical Course as of Sep 18 1239  Sat Oct 05, 85  [id number] 84 year old female with left hip pain after a fall off a ladder yesterday.  She says she has no pain moving it but is unable to bear weight.  Initial x-ray imaging is negative for fracture.  The gets reasonable to put her in for CT/MRI whichever is available to see if there is an occult fracture.   [MB]    Clinical Course User Index [MB] Hayden Rasmussen, MD      Final Clinical Impressions(s) / ED Diagnoses MDM  Vital signs within normal limits.  Pulse oximetry is within normal limits at 97% on room air.  Patient sustained a fall and injured the left hip.  Patient unable to bear weight on the hip at this time.  X-ray is negative for acute problem.  Patient cannot bear weight on the left hip/lower extremity.  Patient seen with me by Dr. Melina Copa.  Patient will receive CT scan of the hip to rule out an occult fracture.  CT scan of the left hip shows a nondisplaced  and incomplete fracture of the anterior wall of the left acetabulum.  Call placed to triad hospitalist.  Hospitalist would like to have clearance from orthopedics concerning this patient.  Call placed to Dr. Marshell Garfinkel.  He states that the patient can be at this facility.  Patient will need pain control and monitoring as she is unable to walk.  Admission labs pending.  I discussed the case with the patient in terms of which she understands.  We discussed the CT scan, and the need for careful management.  The patient states that she has hospital bed, walker, wheelchair, bedside commode, and other hospital equipment at her home.  She has someone at home who is able and willing to help her safely get from bed to any equipment that she might need.  She requests to go home, to discuss this issue further with Dr.Golding.  I have requested the charge nurse to engage the paramedics for assistance with this patient getting home.  The patient informs me that she has pain medication and muscle relaxer medication and Tylenol at home for pain.  Patient is invited to return to the emergency department immediately if any changes in her condition, problems, or concerns.   Final diagnoses:  Closed nondisplaced fracture of left acetabulum, unspecified portion of acetabulum, initial encounter [facility])    ED Discharge Orders    None       Lily Kocher, PA-C 09/17/18 1630    Hayden Rasmussen, MD 09/17/18 [phone]

## 2018-09-17 NOTE — Discharge Instructions (Addendum)
The CT scan of your left hip shows a nondisplaced fracture of your acetabulum, the portion of your pelvis that holds the hip.  You have elected to be at home at this time.  If anything changes about your condition, please return to the emergency department immediately.  Please use your pain medication as previously prescribed.  Please have someone with you when transferring from your bed to your bedside commode or any of your other appliances.  Please notify Dr. Hilma Favors on Monday, October 7 concerning your fracture.  Please return to the emergency department if any changes, problems, or concerns.

## 2018-09-17 NOTE — ED Notes (Signed)
PA at bedside.

## 2018-09-17 NOTE — ED Triage Notes (Signed)
Patient brought in via EMS from home. Alert and oriented. Patient c/o left hip pain. Per patient fell from step ladder yesterday. Per patient fell from bottom step. Denies LOC or hitting head. Patient states unable to bear weight. No obvious deformity noted (shortening or rotation). Pedal pulses present. CNS intact.

## 2018-09-17 NOTE — ED Notes (Signed)
ED Provider at bedside. 

## 2018-09-17 NOTE — ED Notes (Signed)
Handy called back, Dr. Melina Copa made aware of plan

## 2018-09-17 NOTE — ED Notes (Signed)
Returned from CT.

## 2018-09-17 NOTE — ED Notes (Signed)
Patient transported to X-ray 

## 2018-09-17 NOTE — ED Notes (Signed)
Patient transported to CT 

## 2018-10-19 DIAGNOSIS — Z23 Encounter for immunization: Secondary | ICD-10-CM | POA: Diagnosis not present

## 2018-10-19 DIAGNOSIS — I10 Essential (primary) hypertension: Secondary | ICD-10-CM | POA: Diagnosis not present

## 2018-10-19 DIAGNOSIS — M19011 Primary osteoarthritis, right shoulder: Secondary | ICD-10-CM | POA: Diagnosis not present

## 2018-10-19 DIAGNOSIS — Z0001 Encounter for general adult medical examination with abnormal findings: Secondary | ICD-10-CM | POA: Diagnosis not present

## 2018-10-19 DIAGNOSIS — G894 Chronic pain syndrome: Secondary | ICD-10-CM | POA: Diagnosis not present

## 2018-10-19 DIAGNOSIS — E782 Mixed hyperlipidemia: Secondary | ICD-10-CM | POA: Diagnosis not present

## 2018-10-19 DIAGNOSIS — E7849 Other hyperlipidemia: Secondary | ICD-10-CM | POA: Diagnosis not present

## 2018-10-19 DIAGNOSIS — Z1389 Encounter for screening for other disorder: Secondary | ICD-10-CM | POA: Diagnosis not present

## 2018-11-01 DIAGNOSIS — R350 Frequency of micturition: Secondary | ICD-10-CM | POA: Diagnosis not present

## 2018-11-17 ENCOUNTER — Emergency Department (HOSPITAL_COMMUNITY): Payer: Medicare Other

## 2018-11-17 ENCOUNTER — Other Ambulatory Visit: Payer: Self-pay

## 2018-11-17 ENCOUNTER — Encounter (HOSPITAL_COMMUNITY): Payer: Self-pay | Admitting: *Deleted

## 2018-11-17 ENCOUNTER — Emergency Department (HOSPITAL_COMMUNITY)
Admission: EM | Admit: 2018-11-17 | Discharge: 2018-11-17 | Disposition: A | Discharge status: Home / Self Care | Payer: Medicare Other | Attending: Emergency Medicine | Admitting: Emergency Medicine

## 2018-11-17 DIAGNOSIS — Y929 Unspecified place or not applicable: Secondary | ICD-10-CM | POA: Diagnosis not present

## 2018-11-17 DIAGNOSIS — R0781 Pleurodynia: Secondary | ICD-10-CM | POA: Diagnosis not present

## 2018-11-17 DIAGNOSIS — W010XXA Fall on same level from slipping, tripping and stumbling without subsequent striking against object, initial encounter: Secondary | ICD-10-CM | POA: Diagnosis not present

## 2018-11-17 DIAGNOSIS — Z79899 Other long term (current) drug therapy: Secondary | ICD-10-CM | POA: Diagnosis not present

## 2018-11-17 DIAGNOSIS — S3991XA Unspecified injury of abdomen, initial encounter: Secondary | ICD-10-CM | POA: Diagnosis not present

## 2018-11-17 DIAGNOSIS — S199XXA Unspecified injury of neck, initial encounter: Secondary | ICD-10-CM | POA: Diagnosis not present

## 2018-11-17 DIAGNOSIS — S299XXA Unspecified injury of thorax, initial encounter: Secondary | ICD-10-CM | POA: Diagnosis not present

## 2018-11-17 DIAGNOSIS — S22060A Wedge compression fracture of T7-T8 vertebra, initial encounter for closed fracture: Secondary | ICD-10-CM | POA: Insufficient documentation

## 2018-11-17 DIAGNOSIS — Z87891 Personal history of nicotine dependence: Secondary | ICD-10-CM | POA: Insufficient documentation

## 2018-11-17 DIAGNOSIS — R072 Precordial pain: Secondary | ICD-10-CM | POA: Insufficient documentation

## 2018-11-17 DIAGNOSIS — E039 Hypothyroidism, unspecified: Secondary | ICD-10-CM | POA: Insufficient documentation

## 2018-11-17 DIAGNOSIS — Y9389 Activity, other specified: Secondary | ICD-10-CM | POA: Diagnosis not present

## 2018-11-17 DIAGNOSIS — R0902 Hypoxemia: Secondary | ICD-10-CM | POA: Diagnosis not present

## 2018-11-17 DIAGNOSIS — Z7982 Long term (current) use of aspirin: Secondary | ICD-10-CM | POA: Diagnosis not present

## 2018-11-17 DIAGNOSIS — S0990XA Unspecified injury of head, initial encounter: Secondary | ICD-10-CM | POA: Diagnosis not present

## 2018-11-17 DIAGNOSIS — S32040A Wedge compression fracture of fourth lumbar vertebra, initial encounter for closed fracture: Secondary | ICD-10-CM | POA: Insufficient documentation

## 2018-11-17 DIAGNOSIS — R079 Chest pain, unspecified: Secondary | ICD-10-CM | POA: Diagnosis not present

## 2018-11-17 DIAGNOSIS — I10 Essential (primary) hypertension: Secondary | ICD-10-CM | POA: Insufficient documentation

## 2018-11-17 DIAGNOSIS — M5489 Other dorsalgia: Secondary | ICD-10-CM | POA: Diagnosis not present

## 2018-11-17 DIAGNOSIS — Y999 Unspecified external cause status: Secondary | ICD-10-CM | POA: Diagnosis not present

## 2018-11-17 DIAGNOSIS — R0789 Other chest pain: Secondary | ICD-10-CM | POA: Diagnosis not present

## 2018-11-17 HISTORY — DX: Personal history of urinary (tract) infections: Z87.440

## 2018-11-17 LAB — I-STAT CHEM 8, ED
BUN: 7 mg/dL — AB (ref 8–23)
CALCIUM ION: 1.14 mmol/L — AB (ref 1.15–1.40)
CREATININE: 0.6 mg/dL (ref 0.44–1.00)
Chloride: 99 mmol/L (ref 98–111)
Glucose, Bld: 110 mg/dL — ABNORMAL HIGH (ref 70–99)
HCT: 37 % (ref 36.0–46.0)
HEMOGLOBIN: 12.6 g/dL (ref 12.0–15.0)
Potassium: 3.6 mmol/L (ref 3.5–5.1)
SODIUM: 137 mmol/L (ref 135–145)
TCO2: 30 mmol/L (ref 22–32)

## 2018-11-17 MED ORDER — MORPHINE SULFATE (PF) 2 MG/ML IV SOLN
2.0000 mg | Freq: Once | INTRAVENOUS | Status: AC
  Administered 2018-11-17: 2 mg via INTRAVENOUS
  Filled 2018-11-17: qty 1

## 2018-11-17 MED ORDER — OXYCODONE-ACETAMINOPHEN 5-325 MG PO TABS
1.0000 | ORAL_TABLET | Freq: Once | ORAL | Status: AC
  Administered 2018-11-17: 1 via ORAL
  Filled 2018-11-17: qty 1

## 2018-11-17 MED ORDER — MORPHINE SULFATE (PF) 2 MG/ML IV SOLN
2.0000 mg | INTRAVENOUS | Status: DC | PRN
  Administered 2018-11-17: 2 mg via INTRAVENOUS
  Filled 2018-11-17: qty 1

## 2018-11-17 MED ORDER — HYDROCODONE-ACETAMINOPHEN 10-325 MG PO TABS: 1.0000 | ORAL_TABLET | Freq: Four times a day (QID) | ORAL | 0 refills | Status: AC | PRN

## 2018-11-17 MED ORDER — ONDANSETRON HCL 4 MG/2ML IJ SOLN
4.0000 mg | Freq: Once | INTRAMUSCULAR | Status: AC
  Administered 2018-11-17: 4 mg via INTRAVENOUS
  Filled 2018-11-17: qty 2

## 2018-11-17 MED ORDER — IOPAMIDOL (ISOVUE-300) INJECTION 61%
100.0000 mL | Freq: Once | INTRAVENOUS | Status: AC | PRN
  Administered 2018-11-17: 100 mL via INTRAVENOUS

## 2018-11-17 MED ORDER — ONDANSETRON 4 MG PO TBDP: 4.0000 mg | ORAL_TABLET | Freq: Three times a day (TID) | ORAL | 0 refills | Status: DC | PRN

## 2018-11-17 NOTE — Discharge Instructions (Addendum)
As discussed you have 2 vertebral fractures which may heal on their own without any intervention, the locations of these include your thoracic eighth vertebrae and your lumbar fourth vertebrae.  Dr. Arnoldo Morale would like you to wear the lumbar brace to protect your lower back.  You may remove this to bathe or shower.  Regarding pain management, you may try increasing your hydrocodone that you currently have, breaking 1 of your 5 mg tablets in half for 1-1/2 tablets or 7.5 mg of hydrocodone.  If this is not effective you may take the 10 mg tablet that I have prescribed you.  Use caution with this medication as it will make you drowsy.  You may also contact your primary doctor if you have any problems or concerns or new symptoms.

## 2018-11-17 NOTE — ED Notes (Signed)
Patient vomited at bedside. States "I feel a little better since I vomited." Patient currently drinking ginger ale with no difficulty. Family requesting to speak with Almyra Free PA prior to discharge. Advised Almyra Free Idol PA.

## 2018-11-17 NOTE — ED Notes (Signed)
Called [employer] for lumbar corsett.  They must call [facility] and someone will be here to measure for this item.

## 2018-11-17 NOTE — ED Provider Notes (Signed)
[facility] EMERGENCY DEPARTMENT Provider Note   CSN: [id number] Arrival date & time: 11/17/18  0759     History   Chief Complaint Chief Complaint  Patient presents with  . Fall    HPI Kelsey Ruiz is a 84 y.o. female with a history of severe arthritis requiring chronic hydrocodone and tramadol therapy, GERD, hyperlipidemia, hypertension and hypothyroidism and a recent left hip acetabular occult fracture from a fall in October presenting with a new fall which occurred yesterday.  She was using her walker to walk to the kitchen, she spilled the coffee cup and while trying to clean it up she fell backwards landing on her buttocks then rolling onto her back, describing a light touch on the floor with her posterior head.  She denies head or neck pain, denies dizziness, nausea, vomiting or confusion, confirmed by daughters at the bedside who live with her.  This injury occurred yesterday morning, she spent the day in bed hoping her symptoms would be improved today, however they are worse.  She endorses severe pain in her mid thoracic region which radiates downward to her coccyx.  She also has anterior sternal pain which is worsened with deep inspiration and with palpation.  She denies shortness of breath.  She also denies weakness or numbness in any of her extremities or pain associated with this fall in her arms or legs. .  The history is provided by the patient and a relative.    Past Medical History:  Diagnosis Date  . Arthritis   . GERD (gastroesophageal reflux disease)   . Glaucoma   . Hx of bladder infections   . Hyperlipemia   . Hypertension   . Hypothyroidism     Patient Active Problem List   Diagnosis Date Noted  . Abnormality of gait 06/21/2014  . Pain in joint, pelvic region and thigh 06/21/2014  . Stiffness of joint, not elsewhere classified, pelvic region and thigh 06/21/2014    Past Surgical History:  Procedure Laterality Date  . ABDOMINAL HYSTERECTOMY    .  BREAST BIOPSY Right 04/11/2014   Procedure: RIGHT BREAST BIOPSY AFTER NEEDLE LOCALIZATION;  Surgeon: Jamesetta So, MD;  Location: [facility];  Service: General;  Laterality: Right;  NEEDLE LOC @ 8:00  . BREAST LUMPECTOMY Right   . BREAST LUMPECTOMY Right   . BREAST SURGERY Bilateral    benign cysts  . CESAREAN SECTION    . laser eyes Bilateral    for glaucoma  . THYROIDECTOMY       OB History   None      Home Medications    Prior to Admission medications   Medication Sig Start Date End Date Taking? Authorizing Provider  alendronate (FOSAMAX) 70 MG tablet Take 70 mg by mouth once a week. Take with a full glass of water on an empty stomach. Take on Saturday.   Yes [provider]  ALPRAZolam Duanne Moron) 1 MG tablet Take 1 mg by mouth 3 (three) times daily as needed for sleep.  07/26/18  Yes [provider]  aspirin EC 81 MG tablet Take 81 mg by mouth daily.   Yes [provider]  atorvastatin (LIPITOR) 80 MG tablet Take 40 mg by mouth daily.  08/17/18  Yes [provider]  Calcium-Magnesium-Zinc (CAL-MAG-ZINC PO) Take 1 tablet by mouth every morning.    Yes [provider]  cholecalciferol (VITAMIN D) 1000 UNITS tablet Take 1,000 Units by mouth every morning.    Yes [provider]  Coenzyme Q10 200 MG capsule Take 200 mg by mouth every morning.    Yes [provider]  dexamethasone (DECADRON) 4 MG tablet Take 1 tablet (4 mg total) by mouth 2 (two) times daily with a meal. 09/17/18  Yes Lily Kocher, PA-C  donepezil (ARICEPT) 10 MG tablet Take 10 mg by mouth at bedtime.   Yes [provider]  Lactobacillus (ACIDOPHILUS) 100 MG CAPS Take 1 capsule by mouth daily.   Yes [provider]  levothyroxine (SYNTHROID, LEVOTHROID) 75 MCG tablet Take 75 mcg by mouth daily before breakfast.   Yes [provider]  meloxicam (MOBIC) 15 MG tablet Take 15 mg by mouth daily.   Yes [provider]  omeprazole  (PRILOSEC) 40 MG capsule Take 40 mg by mouth every morning.    Yes [provider]  traMADol (ULTRAM) 50 MG tablet Take 50 mg by mouth 4 (four) times daily.   Yes [provider]  vitamin B-12 (CYANOCOBALAMIN) 100 MCG tablet Take 100 mcg by mouth every morning.    Yes [provider]  vitamin C (ASCORBIC ACID) 500 MG tablet Take 1,000 mg by mouth every morning.    Yes [provider]  HYDROcodone-acetaminophen (NORCO) 10-325 MG tablet Take 1 tablet by mouth every 6 (six) hours as needed for up to 5 days. 11/17/18 11/22/18  Evalee Jefferson, PA-C  HYDROcodone-homatropine (HYCODAN) 5-1.5 MG/5ML syrup Take 5 mLs by mouth every 6 (six) hours as needed. Patient not taking: Reported on 11/17/2018 09/17/18   Lily Kocher, PA-C  nitrofurantoin (MACRODANTIN) 50 MG capsule Take 50 mg by mouth daily.    [provider]  ondansetron (ZOFRAN ODT) 4 MG disintegrating tablet Take 1 tablet (4 mg total) by mouth every 8 (eight) hours as needed for nausea or vomiting. 11/17/18   Evalee Jefferson, PA-C  predniSONE (DELTASONE) 10 MG tablet Take 10 mg by mouth See admin instructions. Take 5 tablets daily for 3 days, 4 daily for 3 days, 3 daily for 3 days, 2 daily for 3 days, and 1 daily for 3 days 08/31/18   [provider]    Family History Family History  Problem Relation Age of Onset  . Breast cancer Neg Hx     Social History Social History   Tobacco Use  . Smoking status: Former Smoker    Packs/day: 0.50    Years: 10.00    Pack years: 5.00    Types: Cigarettes    Last attempt to quit: 04/05/1984    Years since quitting: 34.6  . Smokeless tobacco: Never Used  Substance Use Topics  . Alcohol use: No  . Drug use: No     Allergies   Sulfa antibiotics   Review of Systems Review of Systems  Constitutional: Negative for fever.  Musculoskeletal: Positive for back pain. Negative for arthralgias, joint swelling and myalgias.  Neurological: Negative for  syncope, weakness, numbness and headaches.     Physical Exam Updated Vital Signs BP (!) 152/78   Pulse 77   Temp 98 F (36.7 C) (Oral)   Resp 19   Ht 5\' 2"  (1.575 m)   Wt 65.8 kg   SpO2 94%   BMI 26.52 kg/m   Physical Exam  Constitutional: She appears well-developed and well-nourished.  Looks uncomfortable  HENT:  Head: Normocephalic.  Eyes: Pupils are equal, round, and reactive to light. EOM are normal.  Neck: Normal range of motion. Neck supple.  Cardiovascular: Normal rate and intact distal pulses.  Pedal pulses  normal.  Pulmonary/Chest: Effort normal.  Reproducible ttp lower sternum.  No palpable deformity.    Abdominal: Soft. Bowel sounds are normal. She exhibits no distension and no mass.  Musculoskeletal: Normal range of motion. She exhibits no edema or deformity.       Thoracic back: She exhibits bony tenderness. She exhibits no swelling, no edema and no deformity.       Lumbar back: She exhibits bony tenderness. She exhibits no swelling, no edema, no deformity and no spasm.  Neurological: She is alert. She has normal strength. She displays no atrophy and no tremor. No sensory deficit. Gait normal.  Reflex Scores:      Patellar reflexes are 2+ on the right side and 2+ on the left side.      Achilles reflexes are 2+ on the right side and 2+ on the left side. No strength deficit noted in hip and knee flexor and extensor muscle groups.  Ankle flexion and extension intact.  Skin: Skin is warm and dry.  Psychiatric: She has a normal mood and affect.  Nursing note and vitals reviewed.    ED Treatments / Results  Labs (all labs ordered are listed, but only abnormal results are displayed) Labs Reviewed  I-STAT CHEM 8, ED - Abnormal; Notable for the following components:      Result Value   BUN 7 (*)    Glucose, Bld 110 (*)    Calcium, Ion 1.14 (*)    All other components within normal limits    EKG None ED ECG REPORT   Date: 11/17/2018  Rate: 75  Rhythm:  normal sinus rhythm  QRS Axis: normal  Intervals: normal  ST/T Wave abnormalities: normal  Conduction Disutrbances:none  Narrative Interpretation:   Old EKG Reviewed: unchanged  I have personally reviewed the EKG tracing and agree with the computerized printout as noted.  Radiology Ct Head Wo Contrast  Result Date: 11/17/2018 CLINICAL DATA:  [place] yesterday.  Head and neck trauma. EXAM: CT HEAD WITHOUT CONTRAST CT CERVICAL SPINE WITHOUT CONTRAST TECHNIQUE: Multidetector CT imaging of the head and cervical spine was performed following the standard protocol without intravenous contrast. Multiplanar CT image reconstructions of the cervical spine were also generated. COMPARISON:  04/11/2013 FINDINGS: CT HEAD FINDINGS Brain: Age related volume loss. Chronic small-vessel ischemic changes of the cerebral hemispheric white matter. No sign of acute infarction, mass lesion, hemorrhage, hydrocephalus or extra-axial collection. Vascular: There is atherosclerotic calcification of the major vessels at the base of the brain. Skull: Negative Sinuses/Orbits: Clear except for a small amount of fluid in the small right division of the sphenoid sinus. Orbits negative. Other: None CT CERVICAL SPINE FINDINGS Alignment: Normal Skull base and vertebrae: No traumatic finding. No primary bone pathology. Soft tissues and spinal canal: Negative Disc levels: Chronic degenerative spondylosis at C3-4 and C4-5 with osteophytic encroachment upon the foramina. Lesser spondylosis at C5-6 and C6-7. Upper chest: Negative Other: None IMPRESSION: Head CT: No acute or traumatic finding. Atrophy and chronic small-vessel ischemic changes. Cervical spine CT: No acute or traumatic finding. Chronic degenerative spondylosis. Electronically Signed   By: Nelson Chimes M.D.   On: 11/17/2018 10:53   Ct Chest W Contrast  Result Date: 11/17/2018 CLINICAL DATA:  Fall.  Thoracic, rib and sternal pain. EXAM: CT CHEST, ABDOMEN, AND PELVIS WITH CONTRAST  TECHNIQUE: Multidetector CT imaging of the chest, abdomen and pelvis was performed following the standard protocol during bolus administration of intravenous contrast. CONTRAST:  180mL ISOVUE-300 IOPAMIDOL (ISOVUE-300) INJECTION 61% COMPARISON:  None. FINDINGS: CT CHEST FINDINGS Cardiovascular: The heart size appears within normal limits. No pericardial effusion identified. Aortic atherosclerosis. Mediastinum/Nodes: The trachea appears patent and is midline. Normal appearance of the esophagus. No enlarged mediastinal or hilar lymph nodes. Lungs/Pleura: No pleural effusion. No airspace consolidation, atelectasis or pneumothorax identified. Within the superior segment of right lower lobe there is a pure ground-glass attenuating nodule measuring 1.3 cm, image 43/4. Musculoskeletal: Age-indeterminate T8 compression inferior endplate deformity is identified with loss of approximately 50% of the vertebral body height. The sternum appears intact. No displaced rib fractures. CT ABDOMEN PELVIS FINDINGS Hepatobiliary: No focal liver abnormality is seen. No gallstones, gallbladder wall thickening, or biliary dilatation. Pancreas: Unremarkable. No pancreatic ductal dilatation or surrounding inflammatory changes. Spleen: No splenic injury or perisplenic hematoma. Adrenals/Urinary Tract: No adrenal hemorrhage or renal injury identified. Bladder is unremarkable. Stomach/Bowel: Stomach is within normal limits. Appendix appears normal. No evidence of bowel wall thickening, distention, or inflammatory changes. Vascular/Lymphatic: Aortic atherosclerosis. No enlarged abdominal or pelvic lymph nodes. Reproductive: Status post hysterectomy. No adnexal masses. Other: No abdominal wall hernia or abnormality. No abdominopelvic ascites. Musculoskeletal: Spondylosis identified within the lumbar spine. Age-indeterminate compression deformity involving the L4 vertebra is identified. This is new since 12/25/2011. Loss of approximately 40% of the  vertebral body height noted. There is new asymmetric sclerosis within the left sacral wing when compared with 09/17/2018 compatible healing sacral insufficiency fracture. Interval healing of nondisplaced fracture involving the anterior wall of the left acetabulum. IMPRESSION: 1. No evidence for solid organ injury within the chest, abdomen or pelvis. 2. Age-indeterminate compression deformities are identified at T8 and L4 with loss of approximately 40% of the vertebral body heights. 3. Increased sclerosis associated with left sacral wing and anterior wall of left acetabulum compatible with healing fractures. 4. Ground-glass attenuating nodule is identified within the right lower lobe measuring 1.3 cm. Initial follow-up with CT at 6-12 months is recommended to confirm persistence. If persistent, repeat CT is recommended every 2 years until 5 years of stability has been established. This recommendation follows the consensus statement: Guidelines for Management of Incidental Pulmonary Nodules Detected on CT Images: From the Fleischner Society 2017; Radiology 2017; 284:228-243. 5.  Aortic Atherosclerosis (ICD10-I70.0). Electronically Signed   By: Kerby Moors M.D.   On: 11/17/2018 11:08   Ct Cervical Spine Wo Contrast  Result Date: 11/17/2018 CLINICAL DATA:  [place] yesterday.  Head and neck trauma. EXAM: CT HEAD WITHOUT CONTRAST CT CERVICAL SPINE WITHOUT CONTRAST TECHNIQUE: Multidetector CT imaging of the head and cervical spine was performed following the standard protocol without intravenous contrast. Multiplanar CT image reconstructions of the cervical spine were also generated. COMPARISON:  04/11/2013 FINDINGS: CT HEAD FINDINGS Brain: Age related volume loss. Chronic small-vessel ischemic changes of the cerebral hemispheric white matter. No sign of acute infarction, mass lesion, hemorrhage, hydrocephalus or extra-axial collection. Vascular: There is atherosclerotic calcification of the major vessels at the base  of the brain. Skull: Negative Sinuses/Orbits: Clear except for a small amount of fluid in the small right division of the sphenoid sinus. Orbits negative. Other: None CT CERVICAL SPINE FINDINGS Alignment: Normal Skull base and vertebrae: No traumatic finding. No primary bone pathology. Soft tissues and spinal canal: Negative Disc levels: Chronic degenerative spondylosis at C3-4 and C4-5 with osteophytic encroachment upon the foramina. Lesser spondylosis at C5-6 and C6-7. Upper chest: Negative Other: None IMPRESSION: Head CT: No acute or traumatic finding. Atrophy and chronic small-vessel ischemic changes. Cervical spine CT: No acute or traumatic finding.  Chronic degenerative spondylosis. Electronically Signed   By: Nelson Chimes M.D.   On: 11/17/2018 10:53   Ct Abdomen Pelvis W Contrast  Result Date: 11/17/2018 CLINICAL DATA:  Fall.  Thoracic, rib and sternal pain. EXAM: CT CHEST, ABDOMEN, AND PELVIS WITH CONTRAST TECHNIQUE: Multidetector CT imaging of the chest, abdomen and pelvis was performed following the standard protocol during bolus administration of intravenous contrast. CONTRAST:  184mL ISOVUE-300 IOPAMIDOL (ISOVUE-300) INJECTION 61% COMPARISON:  None. FINDINGS: CT CHEST FINDINGS Cardiovascular: The heart size appears within normal limits. No pericardial effusion identified. Aortic atherosclerosis. Mediastinum/Nodes: The trachea appears patent and is midline. Normal appearance of the esophagus. No enlarged mediastinal or hilar lymph nodes. Lungs/Pleura: No pleural effusion. No airspace consolidation, atelectasis or pneumothorax identified. Within the superior segment of right lower lobe there is a pure ground-glass attenuating nodule measuring 1.3 cm, image 43/4. Musculoskeletal: Age-indeterminate T8 compression inferior endplate deformity is identified with loss of approximately 50% of the vertebral body height. The sternum appears intact. No displaced rib fractures. CT ABDOMEN PELVIS FINDINGS  Hepatobiliary: No focal liver abnormality is seen. No gallstones, gallbladder wall thickening, or biliary dilatation. Pancreas: Unremarkable. No pancreatic ductal dilatation or surrounding inflammatory changes. Spleen: No splenic injury or perisplenic hematoma. Adrenals/Urinary Tract: No adrenal hemorrhage or renal injury identified. Bladder is unremarkable. Stomach/Bowel: Stomach is within normal limits. Appendix appears normal. No evidence of bowel wall thickening, distention, or inflammatory changes. Vascular/Lymphatic: Aortic atherosclerosis. No enlarged abdominal or pelvic lymph nodes. Reproductive: Status post hysterectomy. No adnexal masses. Other: No abdominal wall hernia or abnormality. No abdominopelvic ascites. Musculoskeletal: Spondylosis identified within the lumbar spine. Age-indeterminate compression deformity involving the L4 vertebra is identified. This is new since 12/25/2011. Loss of approximately 40% of the vertebral body height noted. There is new asymmetric sclerosis within the left sacral wing when compared with 09/17/2018 compatible healing sacral insufficiency fracture. Interval healing of nondisplaced fracture involving the anterior wall of the left acetabulum. IMPRESSION: 1. No evidence for solid organ injury within the chest, abdomen or pelvis. 2. Age-indeterminate compression deformities are identified at T8 and L4 with loss of approximately 40% of the vertebral body heights. 3. Increased sclerosis associated with left sacral wing and anterior wall of left acetabulum compatible with healing fractures. 4. Ground-glass attenuating nodule is identified within the right lower lobe measuring 1.3 cm. Initial follow-up with CT at 6-12 months is recommended to confirm persistence. If persistent, repeat CT is recommended every 2 years until 5 years of stability has been established. This recommendation follows the consensus statement: Guidelines for Management of Incidental Pulmonary Nodules  Detected on CT Images: From the Fleischner Society 2017; Radiology 2017; 284:228-243. 5.  Aortic Atherosclerosis (ICD10-I70.0). Electronically Signed   By: Kerby Moors M.D.   On: 11/17/2018 11:08    Procedures Procedures (including critical care time)  Medications Ordered in ED Medications  morphine 2 MG/ML injection 2 mg (2 mg Intravenous Given 11/17/18 1148)  ondansetron (ZOFRAN) injection 4 mg (has no administration in time range)  oxyCODONE-acetaminophen (PERCOCET/ROXICET) 5-325 MG per tablet 1 tablet (1 tablet Oral Given 11/17/18 0837)  iopamidol (ISOVUE-300) 61 % injection 100 mL (100 mLs Intravenous Contrast Given 11/17/18 0954)  morphine 2 MG/ML injection 2 mg (2 mg Intravenous Given 11/17/18 1345)     Initial Impression / Assessment and Plan / ED Course  I have reviewed the triage vital signs and the nursing notes.  Pertinent labs & imaging results that were available during my care of the patient were reviewed by me  and considered in my medical decision making (see chart for details).     CT imaging reviewed and discussed with pt and daughters at bedside.  Also discussed with Dr. Arnoldo Morale of neurosurgery who reviewed images.  Advised placing pt in a lumbar corset.  No bracing of thoracic spine.  Expects injuries may heal without intervention, but may need to consider kyphoplasty if sx do not improved.  Discussed with pt and will call for f/u within the next 1-2 weeks.  She has home appliances, medical bed, bedside commode, walker and wheelchair for use at home and lives with a daughter, one also next door. Plenty of assistance available.  Pt motivated to go home.  She was prescribed hydrocodone 10 mg tabs.  She is chronically on 5/325 mg qid for osteoarthritis which did not help her pain this am.  Advised may increase to 1.5 or 2 tabs as needed.  Pt states does not have enough left to do this.  Will provide a small supply of the 10 mg strength to use in place of the 5 mg strength if  the 1.5 (7.5 mg) dose is not effective.  Pt and daughters understand this plan.    Dr. Arnoldo Morale recommended a lumbar corset. This was supplied today by [employer].   Final Clinical Impressions(s) / ED Diagnoses   Final diagnoses:  Compression fracture of T8 vertebra, initial encounter ([facility])  Compression fracture of L4 vertebra, initial encounter [facility])    ED Discharge Orders         Ordered    HYDROcodone-acetaminophen (NORCO) 10-325 MG tablet  Every 6 hours PRN     11/17/18 1515    ondansetron (ZOFRAN ODT) 4 MG disintegrating tablet  Every 8 hours PRN     11/17/18 1603           Evalee Jefferson, PA-C 11/17/18 1615    Milton Ferguson, MD 11/18/18 1239

## 2018-11-17 NOTE — ED Notes (Signed)
Advised patient not to drive after discharge due to narcotic medication administration. Also advised patient not to drive while taking prescription pain medication. Patient verbalized understanding. Discharged via wheelchair with family to drive her home. Patient able to stand and ambulate to wheelchair with nurse at side.

## 2018-11-17 NOTE — ED Triage Notes (Signed)
Pt brought in by [facility] from home with c/o fall yesterday morning at 0700 with pain to thoracic area of back. Pt reports she stayed in bed all day yesterday with pain, but this morning the pain was even more. Pt took a Hydrocodone at home this morning with no relief in pain. Pt denies LOC upon fall.

## 2018-11-17 NOTE — ED Notes (Signed)
Tech at bedside and applied lumbar corsett. Patient sitting in chair at bedside, complaining of nausea, weakness, and pain. Gave patient ginger ale, crackers, and peanut butter as requested.

## 2018-11-29 DIAGNOSIS — M546 Pain in thoracic spine: Secondary | ICD-10-CM | POA: Diagnosis not present

## 2018-11-29 DIAGNOSIS — M545 Low back pain: Secondary | ICD-10-CM | POA: Diagnosis not present

## 2018-11-29 DIAGNOSIS — S22060A Wedge compression fracture of T7-T8 vertebra, initial encounter for closed fracture: Secondary | ICD-10-CM | POA: Diagnosis not present

## 2018-11-29 DIAGNOSIS — S32040A Wedge compression fracture of fourth lumbar vertebra, initial encounter for closed fracture: Secondary | ICD-10-CM | POA: Diagnosis not present

## 2018-12-01 ENCOUNTER — Other Ambulatory Visit: Payer: Self-pay | Admitting: Neurosurgery

## 2018-12-01 ENCOUNTER — Other Ambulatory Visit (HOSPITAL_COMMUNITY): Payer: Self-pay | Admitting: Neurosurgery

## 2018-12-01 DIAGNOSIS — S22060A Wedge compression fracture of T7-T8 vertebra, initial encounter for closed fracture: Secondary | ICD-10-CM

## 2018-12-01 DIAGNOSIS — S32040A Wedge compression fracture of fourth lumbar vertebra, initial encounter for closed fracture: Secondary | ICD-10-CM

## 2018-12-15 ENCOUNTER — Ambulatory Visit (HOSPITAL_COMMUNITY)
Admission: RE | Admit: 2018-12-15 | Discharge: 2018-12-15 | Disposition: A | Discharge status: Home / Self Care | Payer: Medicare Other | Source: Ambulatory Visit | Attending: Neurosurgery | Admitting: Neurosurgery

## 2018-12-15 DIAGNOSIS — M5126 Other intervertebral disc displacement, lumbar region: Secondary | ICD-10-CM | POA: Diagnosis not present

## 2018-12-15 DIAGNOSIS — M546 Pain in thoracic spine: Secondary | ICD-10-CM | POA: Diagnosis not present

## 2018-12-15 DIAGNOSIS — S22060A Wedge compression fracture of T7-T8 vertebra, initial encounter for closed fracture: Secondary | ICD-10-CM | POA: Diagnosis not present

## 2018-12-15 DIAGNOSIS — S22060S Wedge compression fracture of T7-T8 vertebra, sequela: Secondary | ICD-10-CM | POA: Diagnosis not present

## 2018-12-15 DIAGNOSIS — S32040A Wedge compression fracture of fourth lumbar vertebra, initial encounter for closed fracture: Secondary | ICD-10-CM

## 2018-12-15 DIAGNOSIS — M48061 Spinal stenosis, lumbar region without neurogenic claudication: Secondary | ICD-10-CM | POA: Diagnosis not present

## 2018-12-19 DIAGNOSIS — G894 Chronic pain syndrome: Secondary | ICD-10-CM | POA: Diagnosis not present

## 2018-12-23 DIAGNOSIS — S22060S Wedge compression fracture of T7-T8 vertebra, sequela: Secondary | ICD-10-CM | POA: Diagnosis not present

## 2019-01-10 ENCOUNTER — Ambulatory Visit (HOSPITAL_COMMUNITY)
Admission: RE | Admit: 2019-01-10 | Discharge: 2019-01-10 | Disposition: A | Discharge status: Home / Self Care | Payer: Medicare Other | Source: Ambulatory Visit | Attending: Family Medicine | Admitting: Family Medicine

## 2019-01-10 ENCOUNTER — Encounter (HOSPITAL_COMMUNITY): Payer: Self-pay

## 2019-01-10 ENCOUNTER — Other Ambulatory Visit (HOSPITAL_COMMUNITY): Payer: Self-pay | Admitting: Family Medicine

## 2019-01-10 DIAGNOSIS — M85851 Other specified disorders of bone density and structure, right thigh: Secondary | ICD-10-CM | POA: Diagnosis not present

## 2019-01-10 DIAGNOSIS — E2839 Other primary ovarian failure: Secondary | ICD-10-CM

## 2019-01-10 DIAGNOSIS — M810 Age-related osteoporosis without current pathological fracture: Secondary | ICD-10-CM | POA: Diagnosis not present

## 2019-01-10 DIAGNOSIS — Z780 Asymptomatic menopausal state: Secondary | ICD-10-CM | POA: Diagnosis not present

## 2019-01-11 DIAGNOSIS — S22060S Wedge compression fracture of T7-T8 vertebra, sequela: Secondary | ICD-10-CM | POA: Diagnosis not present

## 2019-01-11 DIAGNOSIS — M546 Pain in thoracic spine: Secondary | ICD-10-CM | POA: Diagnosis not present

## 2019-01-24 DIAGNOSIS — G894 Chronic pain syndrome: Secondary | ICD-10-CM | POA: Diagnosis not present

## 2019-01-24 DIAGNOSIS — Z1389 Encounter for screening for other disorder: Secondary | ICD-10-CM | POA: Diagnosis not present

## 2019-01-24 DIAGNOSIS — Z0001 Encounter for general adult medical examination with abnormal findings: Secondary | ICD-10-CM | POA: Diagnosis not present

## 2019-03-31 DIAGNOSIS — Z6824 Body mass index (BMI) 24.0-24.9, adult: Secondary | ICD-10-CM | POA: Diagnosis not present

## 2019-03-31 DIAGNOSIS — E039 Hypothyroidism, unspecified: Secondary | ICD-10-CM | POA: Diagnosis not present

## 2019-03-31 DIAGNOSIS — G894 Chronic pain syndrome: Secondary | ICD-10-CM | POA: Diagnosis not present

## 2019-04-17 IMAGING — CT CT HIP*L* W/O CM
2 of 3 series · 16 of 46 positions shown, 18 images · non-contrast
Comparison: Plain films left hip this same day.

CLINICAL DATA: Left hip pain since a fall off a ladder 09/16/2018.
Initial encounter.

EXAM:
CT OF THE LEFT HIP WITHOUT CONTRAST
TECHNIQUE: Multidetector CT imaging of the left hip was performed according to
the standard protocol. Multiplanar CT image reconstructions were
also generated.

[Series 3: axial st · axial · 0.41mm/px · z∈[-242,-96]mm · 13 of 85 slices shown, 15 images]
[im 6/85  soft-tissue]
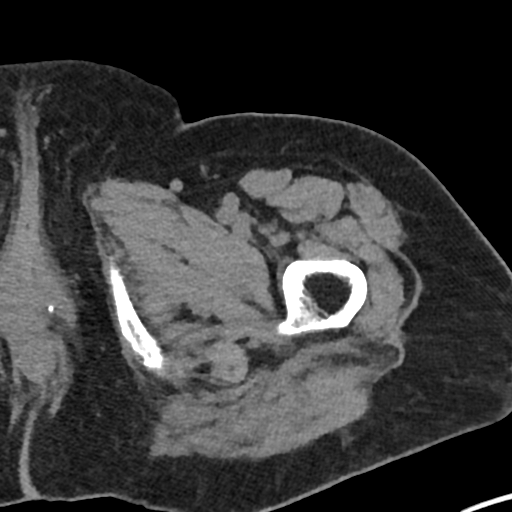
[im 6/85  bone]
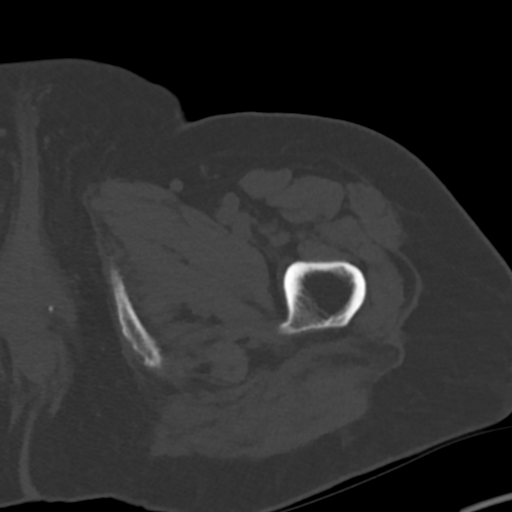
[im 11/85  soft-tissue]
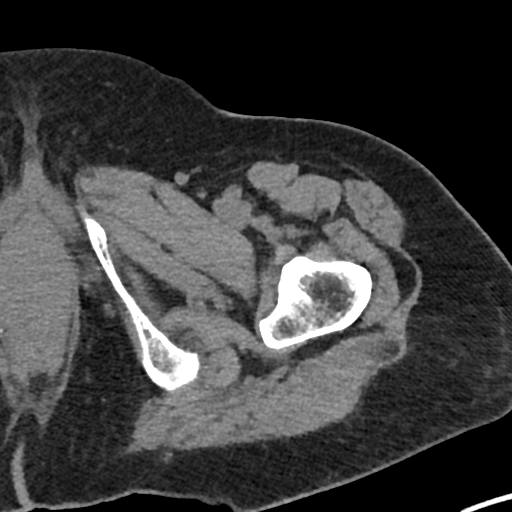
[im 17/85  soft-tissue]
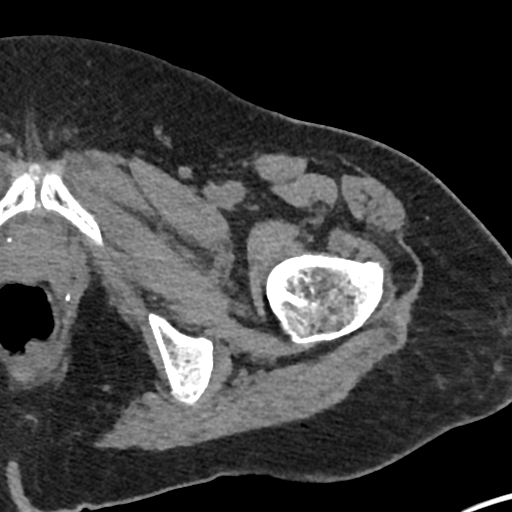
[im 25/85  soft-tissue]
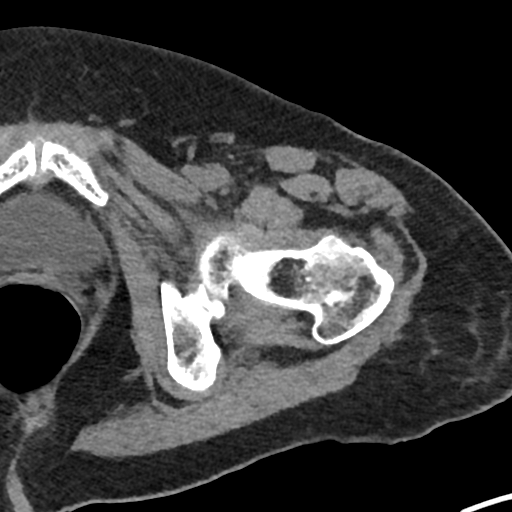
[im 30/85  soft-tissue]
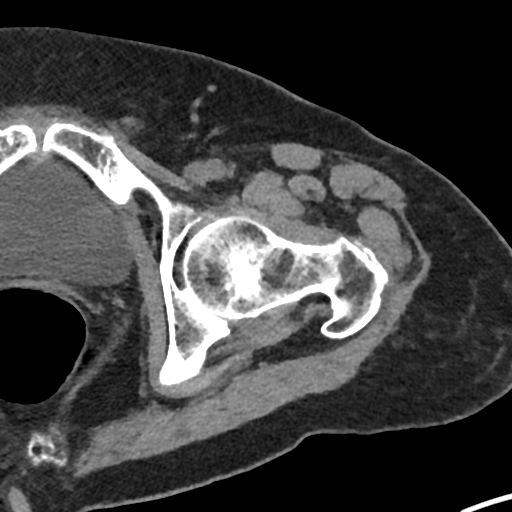
[im 36/85  soft-tissue]
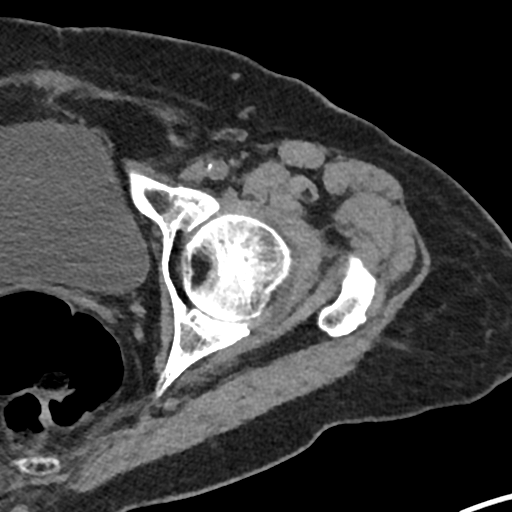
[im 44/85  soft-tissue]
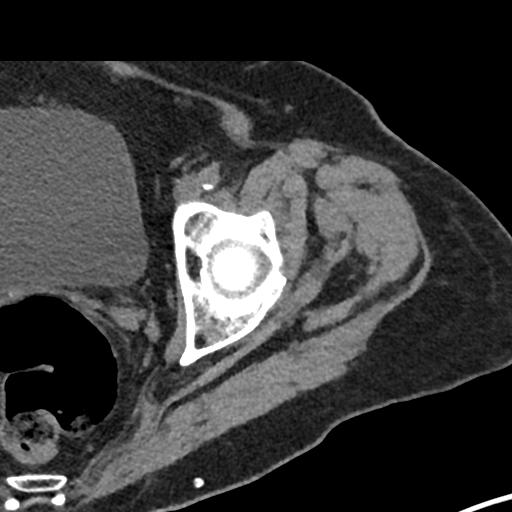
[im 49/85  soft-tissue]
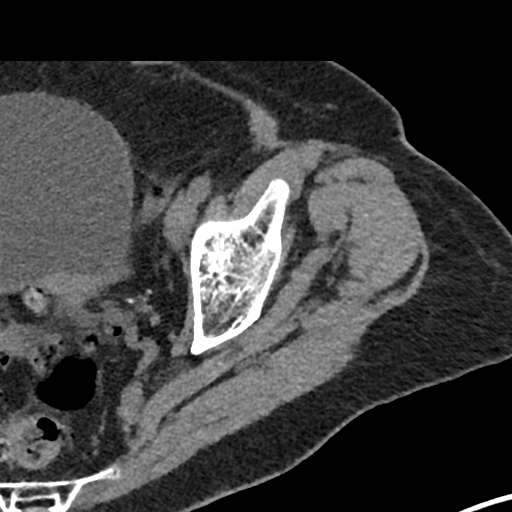
[im 55/85  soft-tissue]
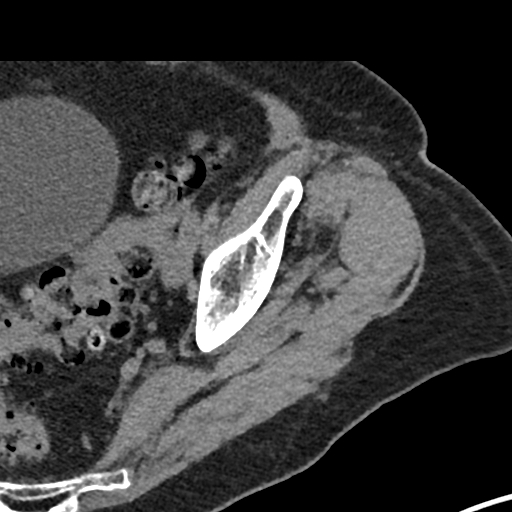
[im 55/85  bone]
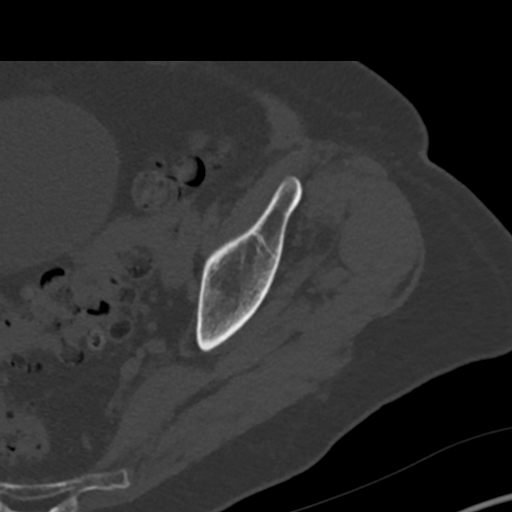
[im 60/85  soft-tissue]
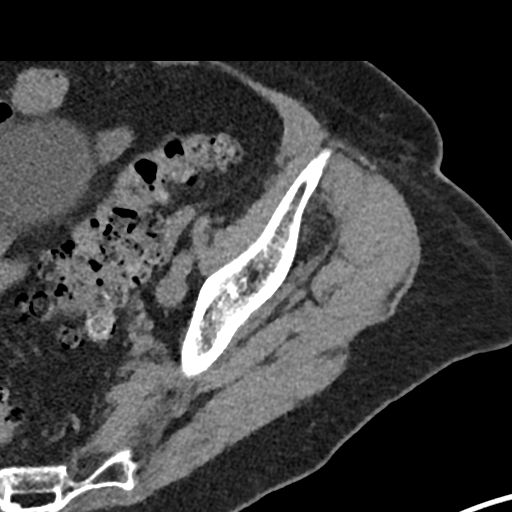
[im 68/85  soft-tissue]
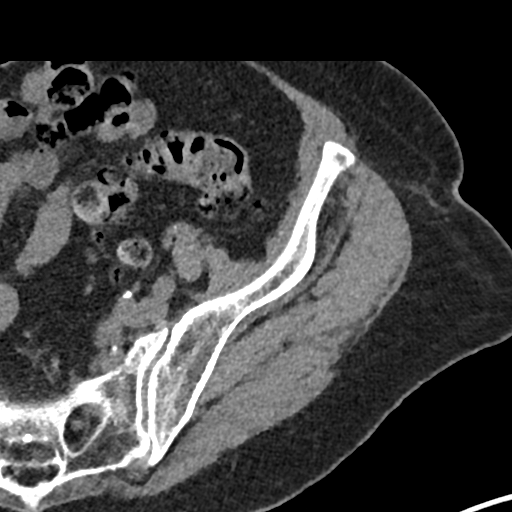
[im 74/85  soft-tissue]
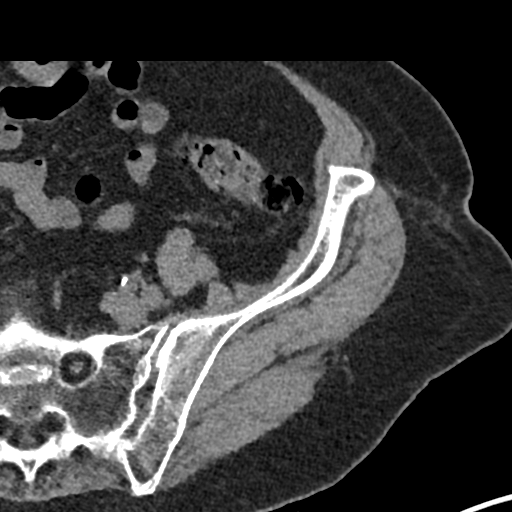
[im 79/85  soft-tissue]
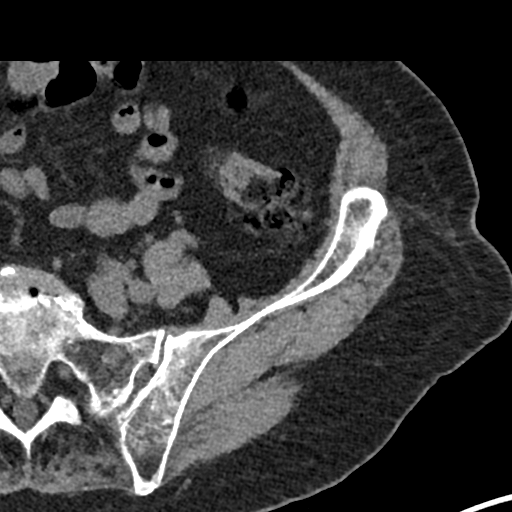

[Series 8: coronal st · coronal · 0.35mm/px · 3 of 81 slices shown]
[im 27/81  soft-tissue]
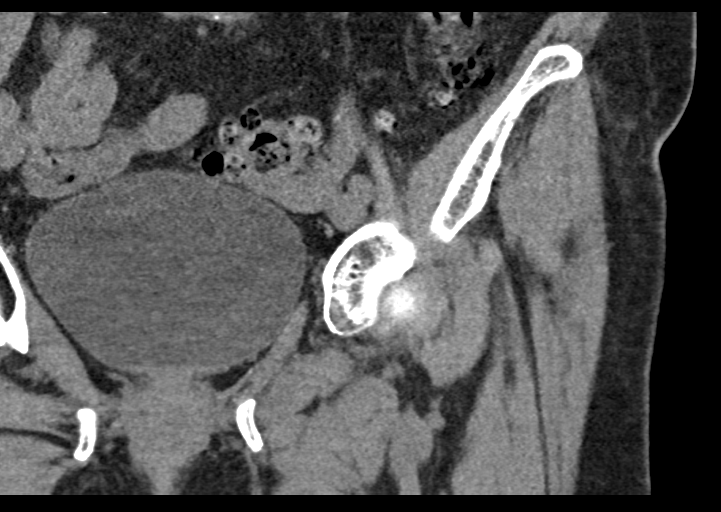
[im 36/81  soft-tissue]
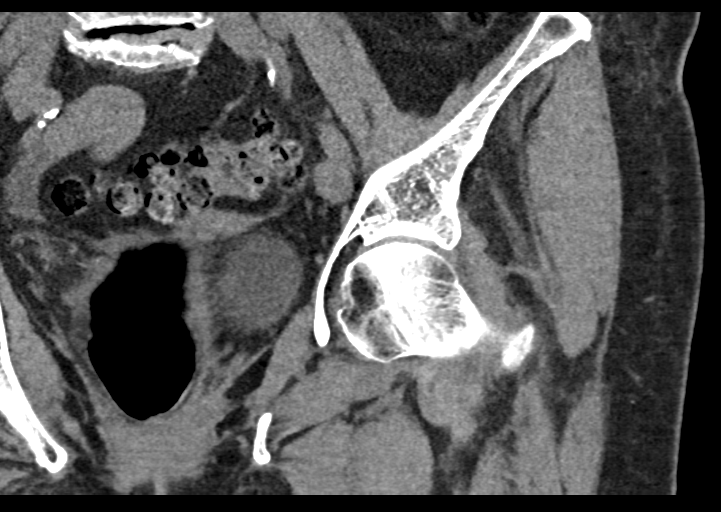
[im 45/81  soft-tissue]
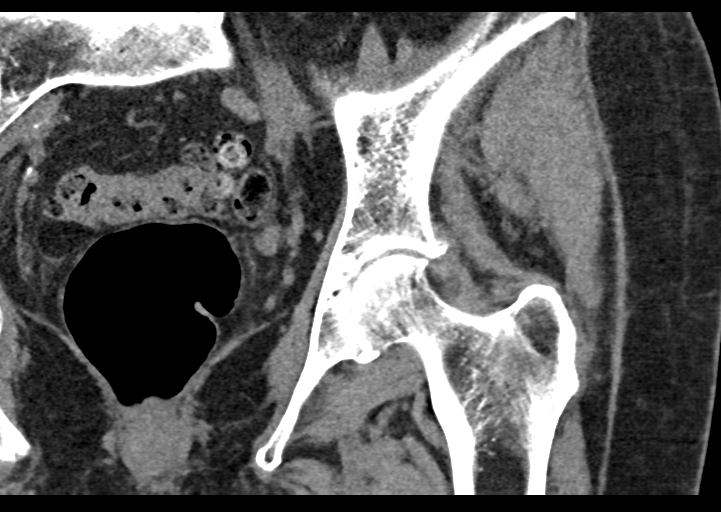

[16 of 46 positions shown; findings below may reference images not displayed]

FINDINGS: Bones/Joint/Cartilage

The patient has a nondisplaced fracture at the patient has a
nondisplaced and incomplete fracture of the anterior wall of the
left acetabulum where the cortex is buckled. No other fracture is
identified. The left hip is located. No focal bony lesion is
identified. Vacuum disc phenomenon is noted at L5-S1. There is mild
degenerative change about the left hip and symphysis pubis.

Ligaments

Suboptimally assessed by CT.

Muscles and Tendons

Intact.

Soft tissues

Soft tissues about the left hip are unremarkable. Imaged intrapelvic
contents demonstrate sigmoid diverticulosis and atherosclerosis.
IMPRESSION: Nondisplaced and incomplete fracture of the anterior wall of the
left acetabulum. No other fracture is identified.

Mild degenerative disease about the left hip and symphysis pubis.

Sigmoid diverticulosis.

Atherosclerosis.

## 2019-05-10 DIAGNOSIS — G894 Chronic pain syndrome: Secondary | ICD-10-CM | POA: Diagnosis not present

## 2019-06-08 DIAGNOSIS — G894 Chronic pain syndrome: Secondary | ICD-10-CM | POA: Diagnosis not present

## 2019-06-08 DIAGNOSIS — Z6824 Body mass index (BMI) 24.0-24.9, adult: Secondary | ICD-10-CM | POA: Diagnosis not present

## 2019-06-23 DIAGNOSIS — H401122 Primary open-angle glaucoma, left eye, moderate stage: Secondary | ICD-10-CM | POA: Diagnosis not present

## 2019-06-29 DIAGNOSIS — E7849 Other hyperlipidemia: Secondary | ICD-10-CM | POA: Diagnosis not present

## 2019-06-29 DIAGNOSIS — I10 Essential (primary) hypertension: Secondary | ICD-10-CM | POA: Diagnosis not present

## 2019-06-29 DIAGNOSIS — E559 Vitamin D deficiency, unspecified: Secondary | ICD-10-CM | POA: Diagnosis not present

## 2019-06-29 DIAGNOSIS — Z6823 Body mass index (BMI) 23.0-23.9, adult: Secondary | ICD-10-CM | POA: Diagnosis not present

## 2019-06-29 DIAGNOSIS — E039 Hypothyroidism, unspecified: Secondary | ICD-10-CM | POA: Diagnosis not present

## 2019-07-17 DIAGNOSIS — Z1389 Encounter for screening for other disorder: Secondary | ICD-10-CM | POA: Diagnosis not present

## 2019-07-17 DIAGNOSIS — I10 Essential (primary) hypertension: Secondary | ICD-10-CM | POA: Diagnosis not present

## 2019-07-17 DIAGNOSIS — G894 Chronic pain syndrome: Secondary | ICD-10-CM | POA: Diagnosis not present

## 2019-07-17 DIAGNOSIS — Z6823 Body mass index (BMI) 23.0-23.9, adult: Secondary | ICD-10-CM | POA: Diagnosis not present

## 2019-08-25 DIAGNOSIS — R600 Localized edema: Secondary | ICD-10-CM | POA: Diagnosis not present

## 2019-08-25 DIAGNOSIS — Z23 Encounter for immunization: Secondary | ICD-10-CM | POA: Diagnosis not present

## 2019-08-25 DIAGNOSIS — M810 Age-related osteoporosis without current pathological fracture: Secondary | ICD-10-CM | POA: Diagnosis not present

## 2019-08-25 DIAGNOSIS — I10 Essential (primary) hypertension: Secondary | ICD-10-CM | POA: Diagnosis not present

## 2019-08-25 DIAGNOSIS — G894 Chronic pain syndrome: Secondary | ICD-10-CM | POA: Diagnosis not present

## 2019-09-13 DIAGNOSIS — I10 Essential (primary) hypertension: Secondary | ICD-10-CM | POA: Diagnosis not present

## 2019-09-13 DIAGNOSIS — E7849 Other hyperlipidemia: Secondary | ICD-10-CM | POA: Diagnosis not present

## 2019-09-13 DIAGNOSIS — M1991 Primary osteoarthritis, unspecified site: Secondary | ICD-10-CM | POA: Diagnosis not present

## 2019-09-26 DIAGNOSIS — G894 Chronic pain syndrome: Secondary | ICD-10-CM | POA: Diagnosis not present

## 2019-10-04 DIAGNOSIS — J22 Unspecified acute lower respiratory infection: Secondary | ICD-10-CM | POA: Diagnosis not present

## 2019-10-10 ENCOUNTER — Other Ambulatory Visit: Payer: Self-pay

## 2019-10-10 ENCOUNTER — Emergency Department (HOSPITAL_COMMUNITY): Payer: Medicare Other

## 2019-10-10 ENCOUNTER — Inpatient Hospital Stay (HOSPITAL_COMMUNITY)
Admission: EM | Admit: 2019-10-10 | Discharge: 2019-10-16 | DRG: 177 | Disposition: A | Discharge status: Home / Self Care | Payer: Medicare Other | Attending: Internal Medicine | Admitting: Internal Medicine

## 2019-10-10 ENCOUNTER — Encounter (HOSPITAL_COMMUNITY): Payer: Self-pay

## 2019-10-10 DIAGNOSIS — E871 Hypo-osmolality and hyponatremia: Secondary | ICD-10-CM | POA: Diagnosis present

## 2019-10-10 DIAGNOSIS — Z87440 Personal history of urinary (tract) infections: Secondary | ICD-10-CM

## 2019-10-10 DIAGNOSIS — E039 Hypothyroidism, unspecified: Secondary | ICD-10-CM | POA: Diagnosis present

## 2019-10-10 DIAGNOSIS — J189 Pneumonia, unspecified organism: Secondary | ICD-10-CM | POA: Diagnosis not present

## 2019-10-10 DIAGNOSIS — Z7983 Long term (current) use of bisphosphonates: Secondary | ICD-10-CM

## 2019-10-10 DIAGNOSIS — H409 Unspecified glaucoma: Secondary | ICD-10-CM | POA: Diagnosis present

## 2019-10-10 DIAGNOSIS — I10 Essential (primary) hypertension: Secondary | ICD-10-CM | POA: Diagnosis not present

## 2019-10-10 DIAGNOSIS — I959 Hypotension, unspecified: Secondary | ICD-10-CM | POA: Diagnosis not present

## 2019-10-10 DIAGNOSIS — K219 Gastro-esophageal reflux disease without esophagitis: Secondary | ICD-10-CM | POA: Diagnosis present

## 2019-10-10 DIAGNOSIS — Z79899 Other long term (current) drug therapy: Secondary | ICD-10-CM | POA: Diagnosis not present

## 2019-10-10 DIAGNOSIS — U071 COVID-19: Secondary | ICD-10-CM | POA: Diagnosis not present

## 2019-10-10 DIAGNOSIS — R0689 Other abnormalities of breathing: Secondary | ICD-10-CM | POA: Diagnosis not present

## 2019-10-10 DIAGNOSIS — E890 Postprocedural hypothyroidism: Secondary | ICD-10-CM | POA: Diagnosis present

## 2019-10-10 DIAGNOSIS — Z7982 Long term (current) use of aspirin: Secondary | ICD-10-CM

## 2019-10-10 DIAGNOSIS — E785 Hyperlipidemia, unspecified: Secondary | ICD-10-CM | POA: Diagnosis not present

## 2019-10-10 DIAGNOSIS — R7989 Other specified abnormal findings of blood chemistry: Secondary | ICD-10-CM | POA: Diagnosis not present

## 2019-10-10 DIAGNOSIS — E7849 Other hyperlipidemia: Secondary | ICD-10-CM | POA: Diagnosis not present

## 2019-10-10 DIAGNOSIS — Z7952 Long term (current) use of systemic steroids: Secondary | ICD-10-CM | POA: Diagnosis not present

## 2019-10-10 DIAGNOSIS — J9601 Acute respiratory failure with hypoxia: Secondary | ICD-10-CM | POA: Diagnosis present

## 2019-10-10 DIAGNOSIS — Z87891 Personal history of nicotine dependence: Secondary | ICD-10-CM

## 2019-10-10 DIAGNOSIS — E876 Hypokalemia: Secondary | ICD-10-CM | POA: Diagnosis present

## 2019-10-10 DIAGNOSIS — R079 Chest pain, unspecified: Secondary | ICD-10-CM | POA: Diagnosis not present

## 2019-10-10 DIAGNOSIS — R509 Fever, unspecified: Secondary | ICD-10-CM | POA: Diagnosis not present

## 2019-10-10 DIAGNOSIS — Z79890 Hormone replacement therapy (postmenopausal): Secondary | ICD-10-CM

## 2019-10-10 DIAGNOSIS — M1611 Unilateral primary osteoarthritis, right hip: Secondary | ICD-10-CM | POA: Diagnosis not present

## 2019-10-10 DIAGNOSIS — J1289 Other viral pneumonia: Secondary | ICD-10-CM | POA: Diagnosis not present

## 2019-10-10 DIAGNOSIS — Z882 Allergy status to sulfonamides status: Secondary | ICD-10-CM | POA: Diagnosis not present

## 2019-10-10 DIAGNOSIS — R05 Cough: Secondary | ICD-10-CM | POA: Diagnosis not present

## 2019-10-10 DIAGNOSIS — R0602 Shortness of breath: Secondary | ICD-10-CM | POA: Diagnosis not present

## 2019-10-10 DIAGNOSIS — R0789 Other chest pain: Secondary | ICD-10-CM | POA: Diagnosis not present

## 2019-10-10 DIAGNOSIS — M810 Age-related osteoporosis without current pathological fracture: Secondary | ICD-10-CM | POA: Diagnosis not present

## 2019-10-10 LAB — COMPREHENSIVE METABOLIC PANEL
ALT: 18 U/L (ref 0–44)
AST: 20 U/L (ref 15–41)
Albumin: 3.3 g/dL — ABNORMAL LOW (ref 3.5–5.0)
Alkaline Phosphatase: 56 U/L (ref 38–126)
Anion gap: 9 (ref 5–15)
BUN: 15 mg/dL (ref 8–23)
CO2: 23 mmol/L (ref 22–32)
Calcium: 8.5 mg/dL — ABNORMAL LOW (ref 8.9–10.3)
Chloride: 100 mmol/L (ref 98–111)
Creatinine, Ser: 0.56 mg/dL (ref 0.44–1.00)
GFR calc Af Amer: >60 mL/min (ref >60)
GFR calc non Af Amer: >60 mL/min (ref >60)
Glucose, Bld: 116 mg/dL — ABNORMAL HIGH (ref 70–99)
Potassium: 3.3 mmol/L — ABNORMAL LOW (ref 3.5–5.1)
Sodium: 132 mmol/L — ABNORMAL LOW (ref 135–145)
Total Bilirubin: 0.9 mg/dL (ref 0.3–1.2)
Total Protein: 7 g/dL (ref 6.5–8.1)

## 2019-10-10 LAB — CBC WITH DIFFERENTIAL/PLATELET
Abs Immature Granulocytes: 0.19 10*3/uL — ABNORMAL HIGH (ref 0.00–0.07)
Basophils Absolute: 0 10*3/uL (ref 0.0–0.1)
Basophils Relative: 0 %
Eosinophils Absolute: 0 10*3/uL (ref 0.0–0.5)
Eosinophils Relative: 0 %
HCT: 38.5 % (ref 36.0–46.0)
Hemoglobin: 12.3 g/dL (ref 12.0–15.0)
Immature Granulocytes: 1 %
Lymphocytes Relative: 3 %
Lymphs Abs: 0.4 10*3/uL — ABNORMAL LOW (ref 0.7–4.0)
MCH: 29 pg (ref 26.0–34.0)
MCHC: 31.9 g/dL (ref 30.0–36.0)
MCV: 90.8 fL (ref 80.0–100.0)
Monocytes Absolute: 1.1 10*3/uL — ABNORMAL HIGH (ref 0.1–1.0)
Monocytes Relative: 8 %
Neutro Abs: 12.1 10*3/uL — ABNORMAL HIGH (ref 1.7–7.7)
Neutrophils Relative %: 88 %
Platelets: 243 10*3/uL (ref 150–400)
RBC: 4.24 MIL/uL (ref 3.87–5.11)
RDW: 12.7 % (ref 11.5–15.5)
WBC: 13.8 10*3/uL — ABNORMAL HIGH (ref 4.0–10.5)
nRBC: 0 % (ref 0.0–0.2)

## 2019-10-10 LAB — LACTIC ACID, PLASMA: Lactic Acid, Venous: 1.3 mmol/L (ref 0.5–1.9)

## 2019-10-10 LAB — LACTATE DEHYDROGENASE: LDH: 165 U/L (ref 98–192)

## 2019-10-10 LAB — FERRITIN: Ferritin: 197 ng/mL (ref 11–307)

## 2019-10-10 LAB — C-REACTIVE PROTEIN: CRP: 16.6 mg/dL — ABNORMAL HIGH (ref <1.0)

## 2019-10-10 LAB — PROCALCITONIN: Procalcitonin: <0.1 ng/mL

## 2019-10-10 LAB — SARS CORONAVIRUS 2 BY RT PCR (HOSPITAL ORDER, PERFORMED IN ~~LOC~~ HOSPITAL LAB): SARS Coronavirus 2: POSITIVE — AB

## 2019-10-10 LAB — TRIGLYCERIDES: Triglycerides: 80 mg/dL (ref <150)

## 2019-10-10 LAB — D-DIMER, QUANTITATIVE: D-Dimer, Quant: 1.49 ug/mL-FEU — ABNORMAL HIGH (ref 0.00–0.50)

## 2019-10-10 LAB — FIBRINOGEN: Fibrinogen: 733 mg/dL — ABNORMAL HIGH (ref 210–475)

## 2019-10-10 MED ORDER — POTASSIUM CHLORIDE CRYS ER 20 MEQ PO TBCR
40.0000 meq | EXTENDED_RELEASE_TABLET | Freq: Once | ORAL | Status: AC
  Administered 2019-10-10: 40 meq via ORAL
  Filled 2019-10-10: qty 2

## 2019-10-10 MED ORDER — SODIUM CHLORIDE 0.9 % IV SOLN
500.0000 mg | Freq: Once | INTRAVENOUS | Status: AC
  Administered 2019-10-10: 500 mg via INTRAVENOUS
  Filled 2019-10-10: qty 500

## 2019-10-10 MED ORDER — ACETAMINOPHEN 500 MG PO TABS
1000.0000 mg | ORAL_TABLET | Freq: Once | ORAL | Status: AC
  Administered 2019-10-10: 18:00:00 1000 mg via ORAL
  Filled 2019-10-10: qty 2

## 2019-10-10 MED ORDER — LEVOTHYROXINE SODIUM 50 MCG PO TABS
75.0000 ug | ORAL_TABLET | Freq: Every day | ORAL | Status: DC
  Administered 2019-10-11 – 2019-10-16 (×6): 75 ug via ORAL
  Filled 2019-10-10 (×6): qty 2

## 2019-10-10 MED ORDER — SODIUM CHLORIDE 0.9 % IV SOLN
1.0000 g | Freq: Once | INTRAVENOUS | Status: AC
  Administered 2019-10-10: 1 g via INTRAVENOUS
  Filled 2019-10-10: qty 10

## 2019-10-10 MED ORDER — DEXAMETHASONE SODIUM PHOSPHATE 10 MG/ML IJ SOLN
6.0000 mg | INTRAMUSCULAR | Status: DC
  Administered 2019-10-11: 01:00:00 6 mg via INTRAVENOUS
  Filled 2019-10-10: qty 1

## 2019-10-10 NOTE — ED Triage Notes (Addendum)
EMS reports pt has had a cough x 1 week.  Reports pcp called in prednisone and doxycycline.  Reports cough productive, clear sputum.  Reports decreased appetite, sob, chest hurting with coughing, and weakness.   Temp 99.7 with ems.  EMS says a neighbor tested positive for covid 8 days ago but pt says she hasn't been around her.

## 2019-10-10 NOTE — Progress Notes (Signed)
Pharmacy Antibiotic Note  JACOBI SCORZA is a 85 y.o. female admitted on 10/10/2019 with COVID 19.  Pharmacy has been consulted for Remdesivir dosing.  CXR shows patchy consolidation of right lung base is noted Pt requiring supplemental oxygen (3L [place]) ALT 18  Plan: Remdesivir 200mg  IV now then 100mg  IV daily x 4 days Will f/u ALT and pt's clinical condition    Height: 5\' 2"  (157.5 cm) Weight: 125 lb (56.7 kg) IBW/kg (Calculated) : 50.1  Temp (24hrs), Avg:101.2 F (38.4 C), Min:99.1 F (37.3 C), Max:103.2 F (39.6 C)  Recent Labs  Lab 10/10/19 1740 10/10/19 1741  WBC  --  13.8*  CREATININE  --  0.56  LATICACIDVEN 1.3  --     Estimated Creatinine Clearance: 38.4 mL/min (by C-G formula based on SCr of 0.56 mg/dL).    Allergies  Allergen Reactions  . Sulfa Antibiotics Nausea And Vomiting    Antimicrobials this admission: 10/28 Remdesivir  >> 11/1 10/27 Azith/Rocephin x 1  Microbiology results: 10/27 BCx:   UCx:  Thank you for allowing pharmacy to be a part of this patient's care.  Sherlon Handing, PharmD, BCPS CGV Clinical pharmacist phone [phone] 10/11/2019 12:00 AM

## 2019-10-10 NOTE — ED Provider Notes (Signed)
[facility] EMERGENCY DEPARTMENT Provider Note   CSN: [id number] Arrival date & time: 10/10/19  1637     History   Chief Complaint Chief Complaint  Patient presents with  . Shortness of Breath    HPI Kelsey Ruiz is a 85 y.o. female.     Patient is an 85 year old female with past medical history of hypertension, hyperlipidemia, GERD, and abdominal hysterectomy.  She presents today for evaluation of fever, cough, and shortness of breath.  This has been worsening over the past week.  For the past 2 days she has become weaker and has had much less energy.  Patient denies any definitive Covid exposure, but I am told a neighbor was diagnosed with a virus.  Patient lives with her daughter who is otherwise been well.  The history is provided by the patient.  Shortness of Breath Severity:  Moderate Onset quality:  Gradual Duration:  1 week Timing:  Constant Progression:  Worsening Chronicity:  New Context: activity and URI   Relieved by:  Nothing Worsened by:  Nothing Ineffective treatments:  None tried Associated symptoms: cough and fever   Associated symptoms: no sputum production     Past Medical History:  Diagnosis Date  . Arthritis   . GERD (gastroesophageal reflux disease)   . Glaucoma   . Hx of bladder infections   . Hyperlipemia   . Hypertension   . Hypothyroidism     Patient Active Problem List   Diagnosis Date Noted  . Abnormality of gait 06/21/2014  . Pain in joint, pelvic region and thigh 06/21/2014  . Stiffness of joint, not elsewhere classified, pelvic region and thigh 06/21/2014    Past Surgical History:  Procedure Laterality Date  . ABDOMINAL HYSTERECTOMY    . BREAST BIOPSY Right 04/11/2014   Procedure: RIGHT BREAST BIOPSY AFTER NEEDLE LOCALIZATION;  Surgeon: Jamesetta So, MD;  Location: [facility];  Service: General;  Laterality: Right;  NEEDLE LOC @ 8:00  . BREAST LUMPECTOMY Right   . BREAST LUMPECTOMY Right   . BREAST SURGERY Bilateral    benign cysts  . CESAREAN SECTION    . laser eyes Bilateral    for glaucoma  . THYROIDECTOMY       OB History   No obstetric history on file.      Home Medications    Prior to Admission medications   Medication Sig Start Date End Date Taking? Authorizing Provider  alendronate (FOSAMAX) 70 MG tablet Take 70 mg by mouth once a week. Take with a full glass of water on an empty stomach. Take on Saturday.    [provider]  ALPRAZolam Duanne Moron) 1 MG tablet Take 1 mg by mouth 3 (three) times daily as needed for sleep.  07/26/18   [provider]  aspirin EC 81 MG tablet Take 81 mg by mouth daily.    [provider]  atorvastatin (LIPITOR) 80 MG tablet Take 40 mg by mouth daily.  08/17/18   [provider]  Calcium-Magnesium-Zinc (CAL-MAG-ZINC PO) Take 1 tablet by mouth every morning.     [provider]  cholecalciferol (VITAMIN D) 1000 UNITS tablet Take 1,000 Units by mouth every morning.     [provider]  Coenzyme Q10 200 MG capsule Take 200 mg by mouth every morning.     [provider]  dexamethasone (DECADRON) 4 MG tablet Take 1 tablet (4 mg total) by mouth 2 (two) times daily with a meal. 09/17/18   Lily Kocher, PA-C  donepezil (ARICEPT) 10 MG tablet Take 10 mg by mouth at bedtime.    [provider]  HYDROcodone-homatropine (HYCODAN) 5-1.5 MG/5ML syrup Take 5 mLs by mouth every 6 (six) hours as needed. Patient not taking: Reported on 11/17/2018 09/17/18   Lily Kocher, PA-C  Lactobacillus (ACIDOPHILUS) 100 MG CAPS Take 1 capsule by mouth daily.    [provider]  levothyroxine (SYNTHROID, LEVOTHROID) 75 MCG tablet Take 75 mcg by mouth daily before breakfast.    [provider]  meloxicam (MOBIC) 15 MG tablet Take 15 mg by mouth daily.    [provider]  nitrofurantoin (MACRODANTIN) 50 MG capsule Take 50 mg by mouth daily.    [provider]  omeprazole (PRILOSEC) 40 MG  capsule Take 40 mg by mouth every morning.     [provider]  ondansetron (ZOFRAN ODT) 4 MG disintegrating tablet Take 1 tablet (4 mg total) by mouth every 8 (eight) hours as needed for nausea or vomiting. 11/17/18   Evalee Jefferson, PA-C  predniSONE (DELTASONE) 10 MG tablet Take 10 mg by mouth See admin instructions. Take 5 tablets daily for 3 days, 4 daily for 3 days, 3 daily for 3 days, 2 daily for 3 days, and 1 daily for 3 days 08/31/18   [provider]  traMADol (ULTRAM) 50 MG tablet Take 50 mg by mouth 4 (four) times daily.    [provider]  vitamin B-12 (CYANOCOBALAMIN) 100 MCG tablet Take 100 mcg by mouth every morning.     [provider]  vitamin C (ASCORBIC ACID) 500 MG tablet Take 1,000 mg by mouth every morning.     [provider]    Family History Family History  Problem Relation Age of Onset  . Breast cancer Neg Hx     Social History Social History   Tobacco Use  . Smoking status: Former Smoker    Packs/day: 0.50    Years: 10.00    Pack years: 5.00    Types: Cigarettes    Quit date: 04/05/1984    Years since quitting: 35.5  . Smokeless tobacco: Never Used  Substance Use Topics  . Alcohol use: No  . Drug use: No     Allergies   Sulfa antibiotics   Review of Systems Review of Systems  Constitutional: Positive for fever.  Respiratory: Positive for cough and shortness of breath. Negative for sputum production.   All other systems reviewed and are negative.    Physical Exam Updated Vital Signs BP 140/65   Pulse 92   Temp (!) 103.2 F (39.6 C) (Rectal)   Resp 18   Ht 5\' 2"  (1.575 m)   Wt 56.7 kg   SpO2 95%   BMI 22.86 kg/m   Physical Exam Vitals signs and nursing note reviewed.  Constitutional:      General: She is not in acute distress.    Appearance: She is well-developed. She is not diaphoretic.     Comments: Patient is somewhat feeble appearing, but is in no acute distress.  HENT:     Head:  Normocephalic and atraumatic.  Neck:     Musculoskeletal: Normal range of motion and neck supple.  Cardiovascular:     Rate and Rhythm: Normal rate and regular rhythm.     Heart sounds: No murmur. No friction rub. No gallop.   Pulmonary:     Effort: Pulmonary effort is normal. No respiratory distress.     Breath sounds: Examination of the right-middle field reveals  rhonchi. Examination of the left-middle field reveals rhonchi. Rhonchi present. No wheezing.  Abdominal:     General: Bowel sounds are normal. There is no distension.     Palpations: Abdomen is soft.     Tenderness: There is no abdominal tenderness.  Musculoskeletal: Normal range of motion.     Right lower leg: She exhibits no tenderness. No edema.     Left lower leg: She exhibits no tenderness. No edema.  Skin:    General: Skin is warm and dry.  Neurological:     Mental Status: She is alert and oriented to person, place, and time.      ED Treatments / Results  Labs (all labs ordered are listed, but only abnormal results are displayed) Labs Reviewed  CULTURE, BLOOD (ROUTINE X 2)  CULTURE, BLOOD (ROUTINE X 2)  SARS CORONAVIRUS 2 (TAT 6-24 HRS)  URINE CULTURE  LACTIC ACID, PLASMA  LACTIC ACID, PLASMA  CBC WITH DIFFERENTIAL/PLATELET  COMPREHENSIVE METABOLIC PANEL  D-DIMER, QUANTITATIVE (NOT AT [facility])  PROCALCITONIN  LACTATE DEHYDROGENASE  FERRITIN  TRIGLYCERIDES  FIBRINOGEN  C-REACTIVE PROTEIN  URINALYSIS, ROUTINE W REFLEX MICROSCOPIC    EKG None  Radiology No results found.  Procedures Procedures (including critical care time)  Medications Ordered in ED Medications - No data to display   Initial Impression / Assessment and Plan / ED Course  I have reviewed the triage vital signs and the nursing notes.  Pertinent labs & imaging results that were available during my care of the patient were reviewed by me and considered in my medical decision making (see chart for details).  Patient presenting  here with a 1 week history of URI-like symptoms, now having shortness of breath and fever.  Patient with hypoxia with saturations of 87% upon presentation with fever of 103.2.  Work-up initiated and showing a pneumonia in the right lower lobe.  Patient's Covid test is pending.  At this point, she will be treated with antibiotics for community-acquired pneumonia and admitted to the hospitalist service under the care of Dr. Myna Hidalgo.  CRITICAL CARE Performed by: Veryl Speak Total critical care time: 35 minutes Critical care time was exclusive of separately billable procedures and treating other patients. Critical care was necessary to treat or prevent imminent or life-threatening deterioration. Critical care was time spent personally by me on the following activities: development of treatment plan with patient and/or surrogate as well as nursing, discussions with consultants, evaluation of patient's response to treatment, examination of patient, obtaining history from patient or surrogate, ordering and performing treatments and interventions, ordering and review of laboratory studies, ordering and review of radiographic studies, pulse oximetry and re-evaluation of patient's condition.   Final Clinical Impressions(s) / ED Diagnoses   Final diagnoses:  None    ED Discharge Orders    None       Veryl Speak, MD 10/10/19 2039

## 2019-10-10 NOTE — ED Notes (Signed)
Patient unable to give urine sample at this time

## 2019-10-10 NOTE — ED Notes (Signed)
Date and time results received: 10/10/19 10:27 PM  (use smartphrase ".now" to insert current time)  Test:  covid Critical Value: positive   Name of Provider Notified: [email or web address]  Orders Received? Or Actions Taken?:

## 2019-10-10 NOTE — H&P (Addendum)
History and Physical    Kelsey Ruiz [id number] DOB: 07-07-1929 DOA: 10/10/2019  PCP: Kelsey Sites, MD   Patient coming from: Home   Chief Complaint: Cough, SOB, fever   HPI: [name] is a 85 y.o. female with medical history significant for hypothyroidism, hypertension, and GERD, now presenting to the emergency department with 1 week of cough, shortness of breath, and fever.  Patient had been started on prednisone and doxycycline early in the course of this illness, felt as though she was getting better, but then began to worsen again over the past 2 days. She has also had generalized weakness and fatigue for the past couple days.  She has had some loose stools intermittently without any abdominal pain or vomiting.  There is report that the patient's neighbor had been diagnosed with Covid, but the patient denies any exposure to this person.  ED Course: Upon arrival to the ED, patient is found to be febrile to 39.6 C, saturating mid 80s on room air, tachypneic, and with stable blood pressure.  Chest x-ray is concerning for right basilar pneumonia.  Chemistry panel notable for mild hyponatremia and hypokalemia.  CBC with leukocytosis to 13,800.  Lactic acid reassuringly normal.  D-dimer elevated to 1.49.  COVID-19 testing in process.  Patient was treated with acetaminophen, Rocephin, and azithromycin in the ED.  Review of Systems:  All other systems reviewed and apart from HPI, are negative.  Past Medical History:  Diagnosis Date  . Arthritis   . GERD (gastroesophageal reflux disease)   . Glaucoma   . Hx of bladder infections   . Hyperlipemia   . Hypertension   . Hypothyroidism     Past Surgical History:  Procedure Laterality Date  . ABDOMINAL HYSTERECTOMY    . BREAST BIOPSY Right 04/11/2014   Procedure: RIGHT BREAST BIOPSY AFTER NEEDLE LOCALIZATION;  Surgeon: Kelsey So, MD;  Location: [facility];  Service: General;  Laterality: Right;  NEEDLE LOC @ 8:00  . BREAST  LUMPECTOMY Right   . BREAST LUMPECTOMY Right   . BREAST SURGERY Bilateral    benign cysts  . CESAREAN SECTION    . laser eyes Bilateral    for glaucoma  . THYROIDECTOMY       reports that she quit smoking about 35 years ago. Her smoking use included cigarettes. She has a 5.00 pack-year smoking history. She has never used smokeless tobacco. She reports that she does not drink alcohol or use drugs.  Allergies  Allergen Reactions  . Sulfa Antibiotics Nausea And Vomiting    Family History  Problem Relation Age of Onset  . Breast cancer Neg Hx      Prior to Admission medications   Medication Sig Start Date End Date Taking? Authorizing Provider  alendronate (FOSAMAX) 70 MG tablet Take 70 mg by mouth once a week. Take with a full glass of water on an empty stomach. Take on Saturday.    [provider]  ALPRAZolam Kelsey Ruiz) 1 MG tablet Take 1 mg by mouth 3 (three) times daily as needed for sleep.  07/26/18   [provider]  aspirin EC 81 MG tablet Take 81 mg by mouth daily.    [provider]  atorvastatin (LIPITOR) 80 MG tablet Take 40 mg by mouth daily.  08/17/18   [provider]  Calcium-Magnesium-Zinc (CAL-MAG-ZINC PO) Take 1 tablet by mouth every morning.     [provider]  cholecalciferol (VITAMIN D) 1000 UNITS tablet Take 1,000 Units by  mouth every morning.     [provider]  Coenzyme Q10 200 MG capsule Take 200 mg by mouth every morning.     [provider]  dexamethasone (DECADRON) 4 MG tablet Take 1 tablet (4 mg total) by mouth 2 (two) times daily with a meal. 09/17/18   Kelsey Kocher, PA-C  donepezil (ARICEPT) 10 MG tablet Take 10 mg by mouth at bedtime.    [provider]  HYDROcodone-homatropine (HYCODAN) 5-1.5 MG/5ML syrup Take 5 mLs by mouth every 6 (six) hours as needed. Patient not taking: Reported on 11/17/2018 09/17/18   Kelsey Kocher, PA-C  Lactobacillus (ACIDOPHILUS) 100 MG CAPS Take 1 capsule by  mouth daily.    [provider]  levothyroxine (SYNTHROID, LEVOTHROID) 75 MCG tablet Take 75 mcg by mouth daily before breakfast.    [provider]  meloxicam (MOBIC) 15 MG tablet Take 15 mg by mouth daily.    [provider]  nitrofurantoin (MACRODANTIN) 50 MG capsule Take 50 mg by mouth daily.    [provider]  omeprazole (PRILOSEC) 40 MG capsule Take 40 mg by mouth every morning.     [provider]  ondansetron (ZOFRAN ODT) 4 MG disintegrating tablet Take 1 tablet (4 mg total) by mouth every 8 (eight) hours as needed for nausea or vomiting. 11/17/18   Kelsey Jefferson, PA-C  predniSONE (DELTASONE) 10 MG tablet Take 10 mg by mouth See admin instructions. Take 5 tablets daily for 3 days, 4 daily for 3 days, 3 daily for 3 days, 2 daily for 3 days, and 1 daily for 3 days 08/31/18   [provider]  traMADol (ULTRAM) 50 MG tablet Take 50 mg by mouth 4 (four) times daily.    [provider]  vitamin B-12 (CYANOCOBALAMIN) 100 MCG tablet Take 100 mcg by mouth every morning.     [provider]  vitamin C (ASCORBIC ACID) 500 MG tablet Take 1,000 mg by mouth every morning.     [provider]    Physical Exam: Vitals:   10/10/19 1730 10/10/19 1800 10/10/19 1830 10/10/19 1900  BP: 129/62 104/82 122/60 111/84  Pulse: 91 87 84 82  Resp: (!) 27 (!) 30 (!) 26 17  Temp:      TempSrc:      SpO2: 95% 95% 97% 93%  Weight:      Height:        Constitutional: NAD, calm  Eyes: PERTLA, lids and conjunctivae normal ENMT: Mucous membranes are moist. Posterior pharynx clear of any exudate or lesions.   Neck: normal, supple, no masses, no thyromegaly Respiratory: Rhonchi at b/l bases, no wheezing. Normal respiratory effort. No accessory muscle use.  Cardiovascular: S1 & S2 heard, regular rate and rhythm. No extremity edema. Abdomen: No distension, no tenderness, soft. Bowel sounds normal.  Musculoskeletal: no clubbing / cyanosis.  No joint deformity upper and lower extremities.    Skin: no significant rashes, lesions, ulcers. Warm, dry, well-perfused. Neurologic: No facial asymmetry. Sensation intact. Moving all extremities.  Psychiatric:  Alert and oriented to person, place, and situation. Pleasant, cooperative.     Labs on Admission: I have personally reviewed following labs and imaging studies  CBC: Recent Labs  Lab 10/10/19 1741  WBC 13.8*  NEUTROABS 12.1*  HGB 12.3  HCT 38.5  MCV 90.8  PLT 0000000   Basic Metabolic Panel: Recent Labs  Lab 10/10/19 1741  NA 132*  K 3.3*  CL 100  CO2 23  GLUCOSE 116*  BUN 15  CREATININE 0.56  CALCIUM 8.5*   GFR: Estimated Creatinine Clearance: 38.4 mL/min (by C-G formula based on SCr of 0.56 mg/dL). Liver Function Tests: Recent Labs  Lab 10/10/19 1741  AST 20  ALT 18  ALKPHOS 56  BILITOT 0.9  PROT 7.0  ALBUMIN 3.3*   No results for input(s): LIPASE, AMYLASE in the last 168 hours. No results for input(s): AMMONIA in the last 168 hours. Coagulation Profile: No results for input(s): INR, PROTIME in the last 168 hours. Cardiac Enzymes: No results for input(s): CKTOTAL, CKMB, CKMBINDEX, TROPONINI in the last 168 hours. BNP (last 3 results) No results for input(s): PROBNP in the last 8760 hours. HbA1C: No results for input(s): HGBA1C in the last 72 hours. CBG: No results for input(s): GLUCAP in the last 168 hours. Lipid Profile: Recent Labs    10/10/19 1741  TRIG 80   Thyroid Function Tests: No results for input(s): TSH, T4TOTAL, FREET4, T3FREE, THYROIDAB in the last 72 hours. Anemia Panel: Recent Labs    10/10/19 1741  FERRITIN 197   Urine analysis:    Component Value Date/Time   COLORURINE AMBER (A) 07/07/2014 1129   APPEARANCEUR CLOUDY (A) 07/07/2014 1129   LABSPEC 1.010 07/07/2014 1129   PHURINE 7.5 07/07/2014 1129   GLUCOSEU NEGATIVE 07/07/2014 1129   HGBUR LARGE (A) 07/07/2014 1129   BILIRUBINUR NEGATIVE 07/07/2014 1129    KETONESUR NEGATIVE 07/07/2014 1129   PROTEINUR 100 (A) 07/07/2014 1129   UROBILINOGEN 0.2 07/07/2014 1129   NITRITE NEGATIVE 07/07/2014 1129   LEUKOCYTESUR LARGE (A) 07/07/2014 1129   Sepsis Labs: @LABRCNTIP (procalcitonin:4,lacticidven:4) ) Recent Results (from the past 240 hour(s))  Blood Culture (routine x 2)     Status: None (Preliminary result)   Collection Time: 10/10/19  5:41 PM   Specimen: Right Antecubital; Blood  Result Value Ref Range Status   Specimen Description RIGHT ANTECUBITAL  Final   Special Requests   Final    Blood Culture adequate volume BOTTLES DRAWN AEROBIC AND ANAEROBIC Performed at [facility], [address] [place], [place] [address]    Culture PENDING  Incomplete   Report Status PENDING  Incomplete  Blood Culture (routine x 2)     Status: None (Preliminary result)   Collection Time: 10/10/19  5:55 PM   Specimen: Right Antecubital; Blood  Result Value Ref Range Status   Specimen Description BLOOD RIGHT WRIST  Final   Special Requests   Final    BOTTLES DRAWN AEROBIC AND ANAEROBIC Blood Culture adequate volume Performed at [facility], [address], [place],  [address]    Culture PENDING  Incomplete   Report Status PENDING  Incomplete     Radiological Exams on Admission: Dg Chest Port 1 View  Result Date: 10/10/2019 CLINICAL DATA:  Fever, cough, shortness of breath for 1 week EXAM: PORTABLE CHEST 1 VIEW COMPARISON:  April 05, 2014 FINDINGS: The aorta is tortuous. The heart size is normal. Patchy consolidation of right lung base is noted. There is no pulmonary edema or pleural effusion. The bony structures are stable. IMPRESSION: Right lung base pneumonia. Electronically Signed   By: Abelardo Diesel M.D.   On: 10/10/2019 18:21    EKG: Not performed.   Assessment/Plan   1. Pneumonia; acute hypoxic respiratory failure  - Presents with 1 wk of productive cough, SOB, fevers, and fatigue, and is found to have high fever, hypoxia, leukocytosis,  and RLL infiltrate on CXR  - COVID-19 testing in process  - Continue airborne and  contact precautions pending COVID-19 results, check sputum culture, check strep pneumo and legionella antigens, continue Rocephin and azithromycin, continue as-needed supplemental O2   ADDENDUM: COVID-19 is positive and procalcitonin undetectable. Plan to stop antibiotics, start Decadron, and start remdesivir (risks and off-label nature of use discussed with patient who agreed to proceed).    2. Hypothyroidism  - Continue Synthroid    3. Hypokalemia  - Potassium 3.2 on admission  - Replaced, repeat chem panel in am    4. Hypertension  - Patient recently started new BP med, unsure of name, pharmacy med-rec pending  5. Elevated d-dimer  - D-dimer elevated to 1.49 in ED  - Hypoxia is explained by RLL infiltrate on CXR, there is no leg swelling or tenderness to suggest DVT, and no pleuritic pain, hemoptysis, or hx of DVT  - Continue to treat infection as above and consider CTA chest if she fails to improve as expected    PPE: CAPR, gown, gloves  DVT prophylaxis: Lovenox  Code Status: Full  Family Communication: Discussed with patient  Consults called: None   Admission status: Inpatient. Patient has pneumonia with new supplemental O2 requirement, has increased risk of mortality due to age and comorbidity and she will require inpatient management.     Vianne Bulls, MD [facility] Pager [phone]  If 7PM-7AM, please contact night-coverage [email or web address] Password [email or web address]  10/10/2019, 7:30 PM

## 2019-10-11 DIAGNOSIS — I10 Essential (primary) hypertension: Secondary | ICD-10-CM | POA: Diagnosis not present

## 2019-10-11 DIAGNOSIS — J189 Pneumonia, unspecified organism: Secondary | ICD-10-CM | POA: Diagnosis not present

## 2019-10-11 DIAGNOSIS — E039 Hypothyroidism, unspecified: Secondary | ICD-10-CM | POA: Diagnosis not present

## 2019-10-11 DIAGNOSIS — J9601 Acute respiratory failure with hypoxia: Secondary | ICD-10-CM | POA: Diagnosis not present

## 2019-10-11 LAB — CBC WITH DIFFERENTIAL/PLATELET
Abs Immature Granulocytes: 0.14 10*3/uL — ABNORMAL HIGH (ref 0.00–0.07)
Basophils Absolute: 0 10*3/uL (ref 0.0–0.1)
Basophils Relative: 0 %
Eosinophils Absolute: 0 10*3/uL (ref 0.0–0.5)
Eosinophils Relative: 0 %
HCT: 35.6 % — ABNORMAL LOW (ref 36.0–46.0)
Hemoglobin: 11.5 g/dL — ABNORMAL LOW (ref 12.0–15.0)
Immature Granulocytes: 1 %
Lymphocytes Relative: 6 %
Lymphs Abs: 0.8 10*3/uL (ref 0.7–4.0)
MCH: 29.4 pg (ref 26.0–34.0)
MCHC: 32.3 g/dL (ref 30.0–36.0)
MCV: 91 fL (ref 80.0–100.0)
Monocytes Absolute: 1.2 10*3/uL — ABNORMAL HIGH (ref 0.1–1.0)
Monocytes Relative: 10 %
Neutro Abs: 10.4 10*3/uL — ABNORMAL HIGH (ref 1.7–7.7)
Neutrophils Relative %: 83 %
Platelets: 285 10*3/uL (ref 150–400)
RBC: 3.91 MIL/uL (ref 3.87–5.11)
RDW: 12.7 % (ref 11.5–15.5)
WBC: 12.6 10*3/uL — ABNORMAL HIGH (ref 4.0–10.5)
nRBC: 0 % (ref 0.0–0.2)

## 2019-10-11 LAB — COMPREHENSIVE METABOLIC PANEL
ALT: 16 U/L (ref 0–44)
AST: 23 U/L (ref 15–41)
Albumin: 3 g/dL — ABNORMAL LOW (ref 3.5–5.0)
Alkaline Phosphatase: 52 U/L (ref 38–126)
Anion gap: 12 (ref 5–15)
BUN: 13 mg/dL (ref 8–23)
CO2: 22 mmol/L (ref 22–32)
Calcium: 8.4 mg/dL — ABNORMAL LOW (ref 8.9–10.3)
Chloride: 102 mmol/L (ref 98–111)
Creatinine, Ser: 0.56 mg/dL (ref 0.44–1.00)
GFR calc Af Amer: >60 mL/min (ref >60)
GFR calc non Af Amer: >60 mL/min (ref >60)
Glucose, Bld: 97 mg/dL (ref 70–99)
Potassium: 3.9 mmol/L (ref 3.5–5.1)
Sodium: 136 mmol/L (ref 135–145)
Total Bilirubin: 0.3 mg/dL (ref 0.3–1.2)
Total Protein: 6.6 g/dL (ref 6.5–8.1)

## 2019-10-11 LAB — MAGNESIUM: Magnesium: 1.9 mg/dL (ref 1.7–2.4)

## 2019-10-11 LAB — ABO/RH: ABO/RH(D): A POS

## 2019-10-11 LAB — SARS CORONAVIRUS 2 (TAT 6-24 HRS): SARS Coronavirus 2: POSITIVE — AB

## 2019-10-11 LAB — D-DIMER, QUANTITATIVE: D-Dimer, Quant: 1.29 ug/mL-FEU — ABNORMAL HIGH (ref 0.00–0.50)

## 2019-10-11 LAB — C-REACTIVE PROTEIN: CRP: 15.6 mg/dL — ABNORMAL HIGH (ref <1.0)

## 2019-10-11 LAB — FERRITIN: Ferritin: 236 ng/mL (ref 11–307)

## 2019-10-11 MED ORDER — SODIUM CHLORIDE 0.9% FLUSH
3.0000 mL | INTRAVENOUS | Status: DC | PRN
  Administered 2019-10-12: 3 mL via INTRAVENOUS
  Filled 2019-10-11: qty 3

## 2019-10-11 MED ORDER — ONDANSETRON HCL 4 MG/2ML IJ SOLN: 4.0000 mg | Freq: Four times a day (QID) | INTRAMUSCULAR | Status: DC | PRN

## 2019-10-11 MED ORDER — SODIUM CHLORIDE 0.9% FLUSH
3.0000 mL | Freq: Two times a day (BID) | INTRAVENOUS | Status: DC
  Administered 2019-10-11 – 2019-10-16 (×12): 3 mL via INTRAVENOUS

## 2019-10-11 MED ORDER — ACETAMINOPHEN 325 MG PO TABS
650.0000 mg | ORAL_TABLET | Freq: Four times a day (QID) | ORAL | Status: DC | PRN
  Administered 2019-10-11 – 2019-10-14 (×3): 650 mg via ORAL
  Filled 2019-10-11 (×4): qty 2

## 2019-10-11 MED ORDER — BENZONATATE 100 MG PO CAPS
200.0000 mg | ORAL_CAPSULE | Freq: Three times a day (TID) | ORAL | Status: DC
  Administered 2019-10-11 – 2019-10-16 (×15): 200 mg via ORAL
  Filled 2019-10-11 (×15): qty 2

## 2019-10-11 MED ORDER — METHYLPREDNISOLONE SODIUM SUCC 40 MG IJ SOLR
40.0000 mg | Freq: Two times a day (BID) | INTRAMUSCULAR | Status: DC
  Administered 2019-10-11 – 2019-10-16 (×11): 40 mg via INTRAVENOUS
  Filled 2019-10-11 (×11): qty 1

## 2019-10-11 MED ORDER — POLYETHYLENE GLYCOL 3350 17 G PO PACK: 17.0000 g | PACK | Freq: Every day | ORAL | Status: DC | PRN

## 2019-10-11 MED ORDER — SODIUM CHLORIDE 0.9 % IV SOLN
100.0000 mg | INTRAVENOUS | Status: AC
  Administered 2019-10-12 – 2019-10-15 (×4): 100 mg via INTRAVENOUS
  Filled 2019-10-11 (×4): qty 20

## 2019-10-11 MED ORDER — FUROSEMIDE 10 MG/ML IJ SOLN
40.0000 mg | Freq: Once | INTRAMUSCULAR | Status: AC
  Administered 2019-10-11: 14:00:00 40 mg via INTRAVENOUS
  Filled 2019-10-11: qty 4

## 2019-10-11 MED ORDER — ENOXAPARIN SODIUM 40 MG/0.4ML ~~LOC~~ SOLN
40.0000 mg | SUBCUTANEOUS | Status: DC
  Administered 2019-10-11 – 2019-10-16 (×6): 40 mg via SUBCUTANEOUS
  Filled 2019-10-11 (×6): qty 0.4

## 2019-10-11 MED ORDER — MAGNESIUM SULFATE 2 GM/50ML IV SOLN
2.0000 g | Freq: Once | INTRAVENOUS | Status: AC
  Administered 2019-10-11: 2 g via INTRAVENOUS
  Filled 2019-10-11: qty 50

## 2019-10-11 MED ORDER — HYDROCOD POLST-CPM POLST ER 10-8 MG/5ML PO SUER
5.0000 mL | Freq: Two times a day (BID) | ORAL | Status: DC | PRN
  Administered 2019-10-11 – 2019-10-16 (×7): 5 mL via ORAL
  Filled 2019-10-11 (×8): qty 5

## 2019-10-11 MED ORDER — HYDROCODONE-ACETAMINOPHEN 5-325 MG PO TABS
1.0000 | ORAL_TABLET | Freq: Four times a day (QID) | ORAL | Status: DC | PRN
  Administered 2019-10-11 – 2019-10-12 (×3): 1 via ORAL
  Filled 2019-10-11 (×3): qty 1

## 2019-10-11 MED ORDER — SODIUM CHLORIDE 0.9 % IV SOLN: 250.0000 mL | INTRAVENOUS | Status: DC | PRN

## 2019-10-11 MED ORDER — ONDANSETRON HCL 4 MG PO TABS
4.0000 mg | ORAL_TABLET | Freq: Four times a day (QID) | ORAL | Status: DC | PRN
  Administered 2019-10-13: 4 mg via ORAL
  Filled 2019-10-11: qty 1

## 2019-10-11 MED ORDER — POTASSIUM CHLORIDE CRYS ER 20 MEQ PO TBCR
20.0000 meq | EXTENDED_RELEASE_TABLET | Freq: Once | ORAL | Status: AC
  Administered 2019-10-11: 20 meq via ORAL
  Filled 2019-10-11: qty 1

## 2019-10-11 MED ORDER — SODIUM CHLORIDE 0.9 % IV SOLN
200.0000 mg | Freq: Once | INTRAVENOUS | Status: AC
  Administered 2019-10-11: 02:00:00 200 mg via INTRAVENOUS
  Filled 2019-10-11 (×2): qty 40

## 2019-10-11 MED ORDER — ACETAMINOPHEN 650 MG RE SUPP: 650.0000 mg | Freq: Four times a day (QID) | RECTAL | Status: DC | PRN

## 2019-10-11 NOTE — Progress Notes (Signed)
PROGRESS NOTE                                                                                                                                                                                                             Patient Demographics:    Kelsey Ruiz, is a 85 y.o. female, DOB - 1929/06/14, [id number]  Outpatient Primary MD for the patient is Sharilyn Sites, MD   Admit date - 10/10/2019   LOS - 1  Chief Complaint  Patient presents with  . Shortness of Breath       Brief Narrative: Patient is a 85 y.o. female with PMHx of HTN, hypothyroidism, GERD-presenting to the ED with 1 week history of cough, shortness of breath-she was found to have acute hypoxic respiratory failure in the setting of COVID-19 pneumonia.  See below for further details.   Subjective:    Kelsey Ruiz today feels the same as yesterday-still coughing-thinks her breathing is somewhat shallow.  Is comfortable on just 2 L of oxygen.   Assessment  & Plan :   Acute Hypoxic Resp Failure due to Covid 19 Viral pneumonia: Appears to be the same as yesterday-on 2 L of oxygen.  Plans are to continue steroids and remdesivir.  CRP still significantly elevated but downtrending compared to yesterday.  Given that she appears relatively stable on just 2 L of oxygen-suspect we can hold off on Actemra and convalescent plasma at this point-however if her oxygen requirements go up-we can consider these agents.  Fever: afebrile/  O2 requirements: On 2 l/m  COVID-19 Labs: Recent Labs    10/10/19 1741 10/11/19 0520  DDIMER 1.49* 1.29*  FERRITIN 197 236  LDH 165  --   CRP 16.6* 15.6*    Lab Results  Component Value Date   SARSCOV2NAA POSITIVE (A) 10/10/2019   SARSCOV2NAA POSITIVE (A) 10/10/2019     COVID-19 Medications: Steroids: 10/27>> Remdesivir: 10/27 Actemra: See above Convalescent Plasma: See above Research Studies:N/A  Other  medications: Diuretics:Euvolemic- Lasix x 1 to maintain negative balance Antibiotics:Not needed as no evidence of bacterial infectio  Prone/Incentive Spirometry: encouraged patient to lie prone for 3-4 hours at a time for a total of 16 hours a day, and to encourage incentive spirometry use 3-4/hour.  DVT Prophylaxis  :  Lovenox   Hypothyroidism: Continue Synthroid  Hypokalemia/hypomagnesemia: Repleted-follow  HTN: BP  controlled-continue to hold all antihypertensive medications for now.  ABG:    Component Value Date/Time   TCO2 30 11/17/2018 0853    Vent Settings: N/A   Condition - Stable  Family Communication  :  Daughter updated over the phone  Code Status :  Full Code  Diet :  Diet Order            Diet regular Room service appropriate? Yes; Fluid consistency: Thin  Diet effective now               Disposition Plan  :  Remain hospitalized  Barriers to discharge: Hypoxia requiring O2 supplementation/complete 5 days of IV Remdesivir  Consults  :  None  Procedures  :  None  Antibiotics  :    Anti-infectives (From admission, onward)   Start     Dose/Rate Route Frequency Ordered Stop   10/12/19 1000  remdesivir 100 mg in sodium chloride 0.9 % 250 mL IVPB     100 mg 500 mL/hr over 30 Minutes Intravenous Every 24 hours 10/11/19 0323 10/16/19 0959   10/11/19 0130  remdesivir 200 mg in sodium chloride 0.9 % 250 mL IVPB     200 mg 500 mL/hr over 30 Minutes Intravenous Once 10/11/19 0006 10/11/19 0222   10/10/19 1930  cefTRIAXone (ROCEPHIN) 1 g in sodium chloride 0.9 % 100 mL IVPB     1 g 200 mL/hr over 30 Minutes Intravenous  Once 10/10/19 1921 10/10/19 2012   10/10/19 1930  azithromycin (ZITHROMAX) 500 mg in sodium chloride 0.9 % 250 mL IVPB     500 mg 250 mL/hr over 60 Minutes Intravenous  Once 10/10/19 1921 10/10/19 2113      Inpatient Medications  Scheduled Meds: . benzonatate  200 mg Oral TID  . enoxaparin (LOVENOX) injection  40 mg Subcutaneous Q24H   . levothyroxine  75 mcg Oral Q0600  . methylPREDNISolone (SOLU-MEDROL) injection  40 mg Intravenous Q12H  . sodium chloride flush  3 mL Intravenous Q12H   Continuous Infusions: . sodium chloride    . [START ON 10/12/2019] remdesivir 100 mg in NS 250 mL     PRN Meds:.sodium chloride, acetaminophen **OR** acetaminophen, chlorpheniramine-HYDROcodone, HYDROcodone-acetaminophen, ondansetron **OR** ondansetron (ZOFRAN) IV, polyethylene glycol, sodium chloride flush   Time Spent in minutes  25  See all Orders from today for further details   Oren Binet M.D on 10/11/2019 at 12:02 PM  To page go to [email or web address] - use universal password  [facility] -  Office  [phone]    Objective:   Vitals:   10/10/19 2300 10/11/19 0106 10/11/19 0330 10/11/19 0725  BP: 118/76  (!) 141/70 (!) 114/59  Pulse: 78  80 69  Resp: (!) 30  20 (!) 24  Temp:  98.7 F (37.1 C) (!) 101.2 F (38.4 C) 99.7 F (37.6 C)  TempSrc:  Oral  Oral  SpO2: 96%  94% 97%  Weight:      Height:        Wt Readings from Last 3 Encounters:  10/10/19 56.7 kg  11/17/18 65.8 kg  09/17/18 64.4 kg     Intake/Output Summary (Last 24 hours) at 10/11/2019 1202 Last data filed at 10/11/2019 0400 Gross per 24 hour  Intake 253.48 ml  Output -  Net 253.48 ml     Physical Exam Gen Exam:Alert awake-not in any distress HEENT:atraumatic, normocephalic Chest: B/L clear to auscultation anteriorly CVS:S1S2 regular Abdomen:soft non tender, non distended Extremities:no edema Neurology: Non focal Skin: no rash  Data Review:    CBC Recent Labs  Lab 10/10/19 1741 10/11/19 0520  WBC 13.8* 12.6*  HGB 12.3 11.5*  HCT 38.5 35.6*  PLT 243 285  MCV 90.8 91.0  MCH 29.0 29.4  MCHC 31.9 32.3  RDW 12.7 12.7  LYMPHSABS 0.4* 0.8  MONOABS 1.1* 1.2*  EOSABS 0.0 0.0  BASOSABS 0.0 0.0    Chemistries  Recent Labs  Lab 10/10/19 1741 10/11/19 0520  NA 132* 136  K 3.3* 3.9  CL 100 102  CO2 23 22   GLUCOSE 116* 97  BUN 15 13  CREATININE 0.56 0.56  CALCIUM 8.5* 8.4*  MG  --  1.9  AST 20 23  ALT 18 16  ALKPHOS 56 52  BILITOT 0.9 0.3   ------------------------------------------------------------------------------------------------------------------ Recent Labs    10/10/19 1741  TRIG 80    No results found for: HGBA1C ------------------------------------------------------------------------------------------------------------------ No results for input(s): TSH, T4TOTAL, T3FREE, THYROIDAB in the last 72 hours.  Invalid input(s): FREET3 ------------------------------------------------------------------------------------------------------------------ Recent Labs    10/10/19 1741 10/11/19 0520  FERRITIN 197 236    Coagulation profile No results for input(s): INR, PROTIME in the last 168 hours.  Recent Labs    10/10/19 1741 10/11/19 0520  DDIMER 1.49* 1.29*    Cardiac Enzymes No results for input(s): CKMB, TROPONINI, MYOGLOBIN in the last 168 hours.  Invalid input(s): CK ------------------------------------------------------------------------------------------------------------------ No results found for: BNP  Micro Results Recent Results (from the past 240 hour(s))  SARS CORONAVIRUS 2 (TAT 6-24 HRS) Nasopharyngeal Nasopharyngeal Swab     Status: Abnormal   Collection Time: 10/10/19  5:06 PM   Specimen: Nasopharyngeal Swab  Result Value Ref Range Status   SARS Coronavirus 2 POSITIVE (A) NEGATIVE Final    Comment: RESULT CALLED TO, READ BACK BY AND VERIFIED WITH: DOSS M, RN AT 0101 ON 10/11/2019 BY SAINVILUS S (NOTE) SARS-CoV-2 target nucleic acids are DETECTED. The SARS-CoV-2 RNA is generally detectable in upper and lower respiratory specimens during the acute phase of infection. Positive results are indicative of active infection with SARS-CoV-2. Clinical  correlation with patient history and other diagnostic information is necessary to determine patient  infection status. Positive results do  not rule out bacterial infection or co-infection with other viruses. The expected result is Negative. Fact Sheet for Patients: SugarRoll.be Fact Sheet for Healthcare Providers: https://www.woods-mathews.com/ This test is not yet approved or cleared by the [place] FDA and  has been authorized for detection and/or diagnosis of SARS-CoV-2 by FDA under an Emergency Use Authorization (EUA). This EUA will remain  in effect (meaning this test can be  used) for the duration of the COVID-19 declaration under Section 564(b)(1) of the Act, 21 U.S.C. section 360bbb-3(b)(1), unless the authorization is terminated or revoked sooner. Performed at [facility], [place] [address], [place], [place] [address]   Blood Culture (routine x 2)     Status: None (Preliminary result)   Collection Time: 10/10/19  5:41 PM   Specimen: Right Antecubital; Blood  Result Value Ref Range Status   Specimen Description RIGHT ANTECUBITAL  Final   Special Requests   Final    Blood Culture adequate volume BOTTLES DRAWN AEROBIC AND ANAEROBIC   Culture   Final    NO GROWTH < 24 HOURS Performed at [facility], [address] [place],  [address]    Report Status PENDING  Incomplete  Blood Culture (routine x 2)     Status: None (Preliminary result)   Collection Time: 10/10/19  5:55 PM  Specimen: BLOOD RIGHT WRIST  Result Value Ref Range Status   Specimen Description BLOOD RIGHT WRIST  Final   Special Requests   Final    BOTTLES DRAWN AEROBIC AND ANAEROBIC Blood Culture adequate volume   Culture   Final    NO GROWTH < 24 HOURS Performed at [facility], [address], [place], [place] [address]    Report Status PENDING  Incomplete  SARS Coronavirus 2 by RT PCR (hospital order, performed in [place] hospital lab) Nasopharyngeal Nasopharyngeal Swab     Status: Abnormal   Collection Time: 10/10/19  7:36 PM   Specimen:  Nasopharyngeal Swab  Result Value Ref Range Status   SARS Coronavirus 2 POSITIVE (A) NEGATIVE Final    Comment: RESULT CALLED TO, READ BACK BY AND VERIFIED WITH: NICKELS,K AT 2226 ON 10.27.20 BY ISLEY,B (NOTE) If result is NEGATIVE SARS-CoV-2 target nucleic acids are NOT DETECTED. The SARS-CoV-2 RNA is generally detectable in upper and lower  respiratory specimens during the acute phase of infection. The lowest  concentration of SARS-CoV-2 viral copies this assay can detect is 250  copies / mL. A negative result does not preclude SARS-CoV-2 infection  and should not be used as the sole basis for treatment or other  patient management decisions.  A negative result may occur with  improper specimen collection / handling, submission of specimen other  than nasopharyngeal swab, presence of viral mutation(s) within the  areas targeted by this assay, and inadequate number of viral copies  (<250 copies / mL). A negative result must be combined with clinical  observations, patient history, and epidemiological information. If result is POSITIVE SARS-CoV-2 target nucleic acids are DETECTE D. The SARS-CoV-2 RNA is generally detectable in upper and lower  respiratory specimens during the acute phase of infection.  Positive  results are indicative of active infection with SARS-CoV-2.  Clinical  correlation with patient history and other diagnostic information is  necessary to determine patient infection status.  Positive results do  not rule out bacterial infection or co-infection with other viruses. If result is PRESUMPTIVE POSTIVE SARS-CoV-2 nucleic acids MAY BE PRESENT.   A presumptive positive result was obtained on the submitted specimen  and confirmed on repeat testing.  While 2019 novel coronavirus  (SARS-CoV-2) nucleic acids may be present in the submitted sample  additional confirmatory testing may be necessary for epidemiological  and / or clinical management purposes  to differentiate  between  SARS-CoV-2 and other Sarbecovirus currently known to infect humans.  If clinically indicated additional testing with an alternate test  methodology (LAB745 3) is advised. The SARS-CoV-2 RNA is generally  detectable in upper and lower respiratory specimens during the acute  phase of infection. The expected result is Negative. Fact Sheet for Patients:  StrictlyIdeas.no Fact Sheet for Healthcare Providers: BankingDealers.co.za This test is not yet approved or cleared by the [place] FDA and has been authorized for detection and/or diagnosis of SARS-CoV-2 by FDA under an Emergency Use Authorization (EUA).  This EUA will remain in effect (meaning this test can be used) for the duration of the COVID-19 declaration under Section 564(b)(1) of the Act, 21 U.S.C. section 360bbb-3(b)(1), unless the authorization is terminated or revoked sooner. Performed at [facility], [address] [place],  [address]     Radiology Reports Dg Chest [place] 1 View  Result Date: 10/10/2019 CLINICAL DATA:  Fever, cough, shortness of breath for 1 week EXAM: PORTABLE CHEST 1 VIEW COMPARISON:  April 05, 2014 FINDINGS: The  aorta is tortuous. The heart size is normal. Patchy consolidation of right lung base is noted. There is no pulmonary edema or pleural effusion. The bony structures are stable. IMPRESSION: Right lung base pneumonia. Electronically Signed   By: Abelardo Diesel M.D.   On: 10/10/2019 18:21

## 2019-10-11 NOTE — Evaluation (Signed)
Physical Therapy Evaluation Patient Details Name: Kelsey Ruiz MRN: [id number] DOB: 15-Apr-1929 Today's Date: 10/11/2019   History of Present Illness  85 y.o. female with PMHx of HTN, hypothyroidism, GERD-presenting to the ED with 1 week history of cough, shortness of breath-she was found to have acute hypoxic respiratory failure in the setting of COVID-19 pneumonia  Clinical Impression   Pt admitted with above diagnosis. PTA states she was independent with AD and lived home with daughter. Pt currently with functional limitations due to the deficits listed below (see PT Problem List). She is currently on 1.5L/min via [place] and was able to maintain sats in 90s with bed mob and transfer from bed to [facility] all with SBA and cues. Pt will benefit from skilled PT to increase her overall strength, balance and coordination, activity tolerance, functional independence and safety with mobility to allow discharge to the venue listed below.       Follow Up Recommendations Home health PT    Equipment Recommendations       Recommendations for Other Services OT consult     Precautions / Restrictions Precautions Precautions: Fall Restrictions Weight Bearing Restrictions: No      Mobility  Bed Mobility Overal bed mobility: Needs Assistance Bed Mobility: Rolling;Sit to Supine;Supine to Sit Rolling: Supervision   Supine to sit: Supervision Sit to supine: Supervision   General bed mobility comments: needs SBA, line management help and also monitor sats  Transfers Overall transfer level: Needs assistance   Transfers: Sit to/from Stand;Stand Pivot Transfers Sit to Stand: Supervision Stand pivot transfers: Supervision       General transfer comment: again needs supervision of lines and sats  Ambulation/Gait             General Gait Details: did not ambulate as yet, pt states that she is very fatigued as she has been ill the past week.  Stairs            Wheelchair Mobility    Modified Rankin (Stroke Patients Only)       Balance Overall balance assessment: Needs assistance Sitting-balance support: Feet unsupported Sitting balance-Leahy Scale: Good Sitting balance - Comments: able to get from bed to [facility] w/ SBA   Standing balance support: During functional activity;Single extremity supported Standing balance-Leahy Scale: Fair                               Pertinent Vitals/Pain Pain Assessment: Faces Faces Pain Scale: Hurts little more Pain Location: in chest with coughing Pain Descriptors / Indicators: Aching;Burning Pain Intervention(s): Limited activity within patient's tolerance    Home Living Family/patient expects to be discharged to:: Private residence Living Arrangements: Children Available Help at Discharge: Family                  Prior Function Level of Independence: Independent with assistive device(s)         Comments: was living at home and shared resposibilities of home with daughters     Hand Dominance        Extremity/Trunk Assessment   Upper Extremity Assessment Upper Extremity Assessment: Defer to OT evaluation    Lower Extremity Assessment Lower Extremity Assessment: Generalized weakness       Communication   Communication: No difficulties  Cognition Arousal/Alertness: Awake/alert Behavior During Therapy: WFL for tasks assessed/performed Overall Cognitive Status: Within Functional Limits for tasks assessed  General Comments General comments (skin integrity, edema, etc.): PTA pt was living home with daughter, states has another daughter who is a CNA and helps with checking vitals etc x 2 a day if needed. Otherwise pt was independent with AD, states was driving and assisting in household chores. This afternoon pt c/o feeling very fatigued from being ill and having to get up to [facility] multi times sec to use of lasix. Pt  is at SBA with bed mob  and transfer from bed to [facility] needing cues for lline safety. Pt was on 1.5L/min via [place] and sats were in 90s throughout.    Exercises Other Exercises Other Exercises: reinforced use of flutter valave x 10 reaps with cues for proper use Other Exercises: use of incentive spirometer x 5 reps with cues for proper use   Assessment/Plan    PT Assessment Patient needs continued PT services(while in hospital)  PT Problem List Decreased strength;Decreased activity tolerance;Decreased balance;Decreased mobility;Decreased coordination;Decreased safety awareness;Decreased knowledge of precautions       PT Treatment Interventions Gait training;Functional mobility training;Therapeutic activities;Therapeutic exercise;Balance training;Neuromuscular re-education;Patient/family education    PT Goals (Current goals can be found in the Care Plan section)  Acute Rehab PT Goals Patient Stated Goal: get stronger PT Goal Formulation: With patient Time For Goal Achievement: 10/25/19 Potential to Achieve Goals: Good    Frequency Min 3X/week   Barriers to discharge        Co-evaluation               AM-PAC PT "6 Clicks" Mobility  Outcome Measure Help needed turning from your back to your side while in a flat bed without using bedrails?: A Little Help needed moving from lying on your back to sitting on the side of a flat bed without using bedrails?: A Little Help needed moving to and from a bed to a chair (including a wheelchair)?: A Little Help needed standing up from a chair using your arms (e.g., wheelchair or bedside chair)?: A Little Help needed to walk in hospital room?: A Lot Help needed climbing 3-5 steps with a railing? : A Lot 6 Click Score: 16    End of Session Equipment Utilized During Treatment: Oxygen Activity Tolerance: Treatment limited secondary to medical complications (Comment) Patient left: in bed;with call bell/phone within reach   PT Visit Diagnosis: Other abnormalities of  gait and mobility (R26.89);Muscle weakness (generalized) (M62.81)    Time: [id number] PT Time Calculation (min) (ACUTE ONLY): 15 min   Charges:   PT Evaluation $PT Eval Moderate Complexity: [place], PT   Delford Field 10/11/2019, 4:10 PM

## 2019-10-12 DIAGNOSIS — J9601 Acute respiratory failure with hypoxia: Secondary | ICD-10-CM | POA: Diagnosis not present

## 2019-10-12 DIAGNOSIS — E039 Hypothyroidism, unspecified: Secondary | ICD-10-CM | POA: Diagnosis not present

## 2019-10-12 DIAGNOSIS — J189 Pneumonia, unspecified organism: Secondary | ICD-10-CM | POA: Diagnosis not present

## 2019-10-12 DIAGNOSIS — I10 Essential (primary) hypertension: Secondary | ICD-10-CM | POA: Diagnosis not present

## 2019-10-12 LAB — COMPREHENSIVE METABOLIC PANEL
ALT: 18 U/L (ref 0–44)
AST: 22 U/L (ref 15–41)
Albumin: 2.8 g/dL — ABNORMAL LOW (ref 3.5–5.0)
Alkaline Phosphatase: 52 U/L (ref 38–126)
Anion gap: 10 (ref 5–15)
BUN: 23 mg/dL (ref 8–23)
CO2: 22 mmol/L (ref 22–32)
Calcium: 8.5 mg/dL — ABNORMAL LOW (ref 8.9–10.3)
Chloride: 98 mmol/L (ref 98–111)
Creatinine, Ser: 0.64 mg/dL (ref 0.44–1.00)
GFR calc Af Amer: >60 mL/min (ref >60)
GFR calc non Af Amer: >60 mL/min (ref >60)
Glucose, Bld: 170 mg/dL — ABNORMAL HIGH (ref 70–99)
Potassium: 4.1 mmol/L (ref 3.5–5.1)
Sodium: 130 mmol/L — ABNORMAL LOW (ref 135–145)
Total Bilirubin: 0.6 mg/dL (ref 0.3–1.2)
Total Protein: 6.5 g/dL (ref 6.5–8.1)

## 2019-10-12 LAB — CBC WITH DIFFERENTIAL/PLATELET
Abs Immature Granulocytes: 0.32 K/uL — ABNORMAL HIGH (ref 0.00–0.07)
Basophils Absolute: 0 K/uL (ref 0.0–0.1)
Basophils Relative: 0 %
Eosinophils Absolute: 0 K/uL (ref 0.0–0.5)
Eosinophils Relative: 0 %
HCT: 35.8 % — ABNORMAL LOW (ref 36.0–46.0)
Hemoglobin: 11.8 g/dL — ABNORMAL LOW (ref 12.0–15.0)
Immature Granulocytes: 4 %
Lymphocytes Relative: 8 %
Lymphs Abs: 0.6 K/uL — ABNORMAL LOW (ref 0.7–4.0)
MCH: 29.2 pg (ref 26.0–34.0)
MCHC: 33 g/dL (ref 30.0–36.0)
MCV: 88.6 fL (ref 80.0–100.0)
Monocytes Absolute: 0.5 K/uL (ref 0.1–1.0)
Monocytes Relative: 6 %
Neutro Abs: 6.6 K/uL (ref 1.7–7.7)
Neutrophils Relative %: 82 %
Platelets: 304 K/uL (ref 150–400)
RBC: 4.04 MIL/uL (ref 3.87–5.11)
RDW: 12.1 % (ref 11.5–15.5)
WBC: 8 K/uL (ref 4.0–10.5)
nRBC: 0 % (ref 0.0–0.2)

## 2019-10-12 LAB — C-REACTIVE PROTEIN: CRP: 14.4 mg/dL — ABNORMAL HIGH

## 2019-10-12 LAB — MAGNESIUM: Magnesium: 2.4 mg/dL (ref 1.7–2.4)

## 2019-10-12 LAB — TYPE AND SCREEN
ABO/RH(D): A POS
Antibody Screen: NEGATIVE

## 2019-10-12 LAB — D-DIMER, QUANTITATIVE: D-Dimer, Quant: 0.89 ug{FEU}/mL — ABNORMAL HIGH (ref 0.00–0.50)

## 2019-10-12 LAB — FERRITIN: Ferritin: 275 ng/mL (ref 11–307)

## 2019-10-12 MED ORDER — DIPHENHYDRAMINE HCL 50 MG/ML IJ SOLN
25.0000 mg | Freq: Once | INTRAMUSCULAR | Status: AC
  Administered 2019-10-12: 25 mg via INTRAVENOUS
  Filled 2019-10-12: qty 1

## 2019-10-12 MED ORDER — FUROSEMIDE 10 MG/ML IJ SOLN
20.0000 mg | Freq: Once | INTRAMUSCULAR | Status: AC
  Administered 2019-10-12: 20 mg via INTRAVENOUS
  Filled 2019-10-12: qty 2

## 2019-10-12 MED ORDER — ACETAMINOPHEN 325 MG PO TABS
650.0000 mg | ORAL_TABLET | Freq: Once | ORAL | Status: AC
  Administered 2019-10-12: 14:00:00 650 mg via ORAL
  Filled 2019-10-12: qty 2

## 2019-10-12 MED ORDER — SODIUM CHLORIDE 0.9% IV SOLUTION
Freq: Once | INTRAVENOUS | Status: AC
  Administered 2019-10-12: 15:00:00 via INTRAVENOUS

## 2019-10-12 NOTE — Progress Notes (Signed)
PROGRESS NOTE                                                                                                                                                                                                             Patient Demographics:    Kelsey Ruiz, is a 85 y.o. female, DOB - 08/19/1929, [id number]  Outpatient Primary MD for the patient is Sharilyn Sites, MD   Admit date - 10/10/2019   LOS - 2  Chief Complaint  Patient presents with  . Shortness of Breath       Brief Narrative: Patient is a 85 y.o. female with PMHx of HTN, hypothyroidism, GERD-presenting to the ED with 1 week history of cough, shortness of breath-she was found to have acute hypoxic respiratory failure in the setting of COVID-19 pneumonia.  See below for further details.   Subjective:    [place] feels essentially the same as yesterday-she thinks her breathing is somewhat shallow-mains on 2 L of oxygen.   Assessment  & Plan :   Acute Hypoxic Resp Failure due to Covid 19 Viral pneumonia: Stable on 2 L-appears the same as yesterday-not in any distress-plans are to continue steroids and remdesivir.  CRP slowly trending down-but given clinical stability-continue to hold off on Actemra.  However we will go ahead and transfuse 1 unit of convalescent plasma.   Fever: afebrile/  O2 requirements: On 2 l/m  COVID-19 Labs: Recent Labs    10/10/19 1741 10/11/19 0520 10/12/19 0155  DDIMER 1.49* 1.29* 0.89*  FERRITIN 197 236 275  LDH 165  --   --   CRP 16.6* 15.6* 14.4*    Lab Results  Component Value Date   SARSCOV2NAA POSITIVE (A) 10/10/2019   SARSCOV2NAA POSITIVE (A) 10/10/2019     COVID-19 Medications: Steroids: 10/27>> Remdesivir: 10/27 Actemra: See above Convalescent Plasma: X10/29 Research Studies:N/A  Other medications: Diuretics:Euvolemic-repeat Lasix x1 to maintain negative balance.   Antibiotics:Not needed as no  evidence of bacterial infectio  Prone/Incentive Spirometry: encouraged patient to lie prone for 3-4 hours at a time for a total of 16 hours a day, and to encourage incentive spirometry use 3-4/hour.  DVT Prophylaxis  :  Lovenox   Hypothyroidism: Continue Synthroid  Hypokalemia/hypomagnesemia: Repleted-follow  HTN: BP controlled without the use of any antihypertensive medications for now.    ABG:  Component Value Date/Time   TCO2 30 11/17/2018 0853    Vent Settings: N/A FiO2 (%):  [2 %] 2 % Condition - Stable  Family Communication  : Left voicemail for patient's daughter on 10/29  Code Status :  Full Code  Diet :  Diet Order            Diet regular Room service appropriate? Yes; Fluid consistency: Thin  Diet effective now               Disposition Plan  :  Remain hospitalized  Barriers to discharge: Hypoxia requiring O2 supplementation/complete 5 days of IV Remdesivir  Consults  :  None  Procedures  :  None  Antibiotics  :    Anti-infectives (From admission, onward)   Start     Dose/Rate Route Frequency Ordered Stop   10/12/19 1000  remdesivir 100 mg in sodium chloride 0.9 % 250 mL IVPB     100 mg 500 mL/hr over 30 Minutes Intravenous Every 24 hours 10/11/19 0323 10/16/19 0959   10/11/19 0130  remdesivir 200 mg in sodium chloride 0.9 % 250 mL IVPB     200 mg 500 mL/hr over 30 Minutes Intravenous Once 10/11/19 0006 10/11/19 0222   10/10/19 1930  cefTRIAXone (ROCEPHIN) 1 g in sodium chloride 0.9 % 100 mL IVPB     1 g 200 mL/hr over 30 Minutes Intravenous  Once 10/10/19 1921 10/10/19 2012   10/10/19 1930  azithromycin (ZITHROMAX) 500 mg in sodium chloride 0.9 % 250 mL IVPB     500 mg 250 mL/hr over 60 Minutes Intravenous  Once 10/10/19 1921 10/10/19 2113      Inpatient Medications  Scheduled Meds: . sodium chloride   Intravenous Once  . acetaminophen  650 mg Oral Once  . benzonatate  200 mg Oral TID  . diphenhydrAMINE  25 mg Intravenous Once  .  enoxaparin (LOVENOX) injection  40 mg Subcutaneous Q24H  . furosemide  20 mg Intravenous Once  . levothyroxine  75 mcg Oral Q0600  . methylPREDNISolone (SOLU-MEDROL) injection  40 mg Intravenous Q12H  . sodium chloride flush  3 mL Intravenous Q12H   Continuous Infusions: . sodium chloride    . remdesivir 100 mg in NS 250 mL 100 mg (10/12/19 0913)   PRN Meds:.sodium chloride, acetaminophen **OR** acetaminophen, chlorpheniramine-HYDROcodone, HYDROcodone-acetaminophen, ondansetron **OR** ondansetron (ZOFRAN) IV, polyethylene glycol, sodium chloride flush   Time Spent in minutes  25  See all Orders from today for further details   Oren Binet M.D on 10/12/2019 at 10:42 AM  To page go to [email or web address] - use universal password  [facility] -  Office  [phone]    Objective:   Vitals:   10/11/19 1626 10/11/19 2031 10/12/19 0513 10/12/19 0737  BP: (!) 150/73 125/89 137/70 135/74  Pulse:  85 73 80  Resp:  20 20 (!) 22  Temp: 98.1 F (36.7 C) 97.7 F (36.5 C) 97.9 F (36.6 C) (!) 97.5 F (36.4 C)  TempSrc: Oral   Oral  SpO2:  94% 95% (!) 87%  Weight:      Height:        Wt Readings from Last 3 Encounters:  10/10/19 56.7 kg  11/17/18 65.8 kg  09/17/18 64.4 kg     Intake/Output Summary (Last 24 hours) at 10/12/2019 1042 Last data filed at 10/11/2019 1500 Gross per 24 hour  Intake 33.21 ml  Output -  Net 33.21 ml     Physical Exam Gen Exam:Alert awake-not  in any distress HEENT:atraumatic, normocephalic Chest: B/L clear to auscultation anteriorly CVS:S1S2 regular Abdomen:soft non tender, non distended Extremities:no edema Neurology: Non focal Skin: no rash   Data Review:    CBC Recent Labs  Lab 10/10/19 1741 10/11/19 0520 10/12/19 0155  WBC 13.8* 12.6* 8.0  HGB 12.3 11.5* 11.8*  HCT 38.5 35.6* 35.8*  PLT 243 285 304  MCV 90.8 91.0 88.6  MCH 29.0 29.4 29.2  MCHC 31.9 32.3 33.0  RDW 12.7 12.7 12.1  LYMPHSABS 0.4* 0.8 0.6*  MONOABS  1.1* 1.2* 0.5  EOSABS 0.0 0.0 0.0  BASOSABS 0.0 0.0 0.0    Chemistries  Recent Labs  Lab 10/10/19 1741 10/11/19 0520 10/12/19 0155  NA 132* 136 130*  K 3.3* 3.9 4.1  CL 100 102 98  CO2 23 22 22   GLUCOSE 116* 97 170*  BUN 15 13 23   CREATININE 0.56 0.56 0.64  CALCIUM 8.5* 8.4* 8.5*  MG  --  1.9 2.4  AST 20 23 22   ALT 18 16 18   ALKPHOS 56 52 52  BILITOT 0.9 0.3 0.6   ------------------------------------------------------------------------------------------------------------------ Recent Labs    10/10/19 1741  TRIG 80    No results found for: HGBA1C ------------------------------------------------------------------------------------------------------------------ No results for input(s): TSH, T4TOTAL, T3FREE, THYROIDAB in the last 72 hours.  Invalid input(s): FREET3 ------------------------------------------------------------------------------------------------------------------ Recent Labs    10/11/19 0520 10/12/19 0155  FERRITIN 236 275    Coagulation profile No results for input(s): INR, PROTIME in the last 168 hours.  Recent Labs    10/11/19 0520 10/12/19 0155  DDIMER 1.29* 0.89*    Cardiac Enzymes No results for input(s): CKMB, TROPONINI, MYOGLOBIN in the last 168 hours.  Invalid input(s): CK ------------------------------------------------------------------------------------------------------------------ No results found for: BNP  Micro Results Recent Results (from the past 240 hour(s))  SARS CORONAVIRUS 2 (TAT 6-24 HRS) Nasopharyngeal Nasopharyngeal Swab     Status: Abnormal   Collection Time: 10/10/19  5:06 PM   Specimen: Nasopharyngeal Swab  Result Value Ref Range Status   SARS Coronavirus 2 POSITIVE (A) NEGATIVE Final    Comment: RESULT CALLED TO, READ BACK BY AND VERIFIED WITH: DOSS M, RN AT 0101 ON 10/11/2019 BY SAINVILUS S (NOTE) SARS-CoV-2 target nucleic acids are DETECTED. The SARS-CoV-2 RNA is generally detectable in upper and lower  respiratory specimens during the acute phase of infection. Positive results are indicative of active infection with SARS-CoV-2. Clinical  correlation with patient history and other diagnostic information is necessary to determine patient infection status. Positive results do  not rule out bacterial infection or co-infection with other viruses. The expected result is Negative. Fact Sheet for Patients: SugarRoll.be Fact Sheet for Healthcare Providers: https://www.woods-mathews.com/ This test is not yet approved or cleared by the [place] FDA and  has been authorized for detection and/or diagnosis of SARS-CoV-2 by FDA under an Emergency Use Authorization (EUA). This EUA will remain  in effect (meaning this test can be  used) for the duration of the COVID-19 declaration under Section 564(b)(1) of the Act, 21 U.S.C. section 360bbb-3(b)(1), unless the authorization is terminated or revoked sooner. Performed at [facility], [place] [address] [place], [place] [address]   Blood Culture (routine x 2)     Status: None (Preliminary result)   Collection Time: 10/10/19  5:41 PM   Specimen: Right Antecubital; Blood  Result Value Ref Range Status   Specimen Description RIGHT ANTECUBITAL  Final   Special Requests   Final    Blood Culture adequate volume BOTTLES DRAWN AEROBIC  AND ANAEROBIC   Culture   Final    NO GROWTH 2 DAYS Performed at [facility], [address] [place], [place] [address]    Report Status PENDING  Incomplete  Blood Culture (routine x 2)     Status: None (Preliminary result)   Collection Time: 10/10/19  5:55 PM   Specimen: BLOOD RIGHT WRIST  Result Value Ref Range Status   Specimen Description BLOOD RIGHT WRIST  Final   Special Requests   Final    BOTTLES DRAWN AEROBIC AND ANAEROBIC Blood Culture adequate volume   Culture   Final    NO GROWTH 2 DAYS Performed at [name], [address], [place], [place] [address]     Report Status PENDING  Incomplete  SARS Coronavirus 2 by RT PCR (hospital order, performed in [place] hospital lab) Nasopharyngeal Nasopharyngeal Swab     Status: Abnormal   Collection Time: 10/10/19  7:36 PM   Specimen: Nasopharyngeal Swab  Result Value Ref Range Status   SARS Coronavirus 2 POSITIVE (A) NEGATIVE Final    Comment: RESULT CALLED TO, READ BACK BY AND VERIFIED WITH: NICKELS,K AT 2226 ON 10.27.20 BY ISLEY,B (NOTE) If result is NEGATIVE SARS-CoV-2 target nucleic acids are NOT DETECTED. The SARS-CoV-2 RNA is generally detectable in upper and lower  respiratory specimens during the acute phase of infection. The lowest  concentration of SARS-CoV-2 viral copies this assay can detect is 250  copies / mL. A negative result does not preclude SARS-CoV-2 infection  and should not be used as the sole basis for treatment or other  patient management decisions.  A negative result may occur with  improper specimen collection / handling, submission of specimen other  than nasopharyngeal swab, presence of viral mutation(s) within the  areas targeted by this assay, and inadequate number of viral copies  (<250 copies / mL). A negative result must be combined with clinical  observations, patient history, and epidemiological information. If result is POSITIVE SARS-CoV-2 target nucleic acids are DETECTE D. The SARS-CoV-2 RNA is generally detectable in upper and lower  respiratory specimens during the acute phase of infection.  Positive  results are indicative of active infection with SARS-CoV-2.  Clinical  correlation with patient history and other diagnostic information is  necessary to determine patient infection status.  Positive results do  not rule out bacterial infection or co-infection with other viruses. If result is PRESUMPTIVE POSTIVE SARS-CoV-2 nucleic acids MAY BE PRESENT.   A presumptive positive result was obtained on the submitted specimen  and confirmed on repeat  testing.  While 2019 novel coronavirus  (SARS-CoV-2) nucleic acids may be present in the submitted sample  additional confirmatory testing may be necessary for epidemiological  and / or clinical management purposes  to differentiate between  SARS-CoV-2 and other Sarbecovirus currently known to infect humans.  If clinically indicated additional testing with an alternate test  methodology (LAB745 3) is advised. The SARS-CoV-2 RNA is generally  detectable in upper and lower respiratory specimens during the acute  phase of infection. The expected result is Negative. Fact Sheet for Patients:  StrictlyIdeas.no Fact Sheet for Healthcare Providers: BankingDealers.co.za This test is not yet approved or cleared by the [place] FDA and has been authorized for detection and/or diagnosis of SARS-CoV-2 by FDA under an Emergency Use Authorization (EUA).  This EUA will remain in effect (meaning this test can be used) for the duration of the COVID-19 declaration under Section 564(b)(1) of the Act, 21 U.S.C. section 360bbb-3(b)(1),  unless the authorization is terminated or revoked sooner. Performed at [facility], [address], [place],  [address]     Radiology Reports Dg Chest [place] 1 View  Result Date: 10/10/2019 CLINICAL DATA:  Fever, cough, shortness of breath for 1 week EXAM: PORTABLE CHEST 1 VIEW COMPARISON:  April 05, 2014 FINDINGS: The aorta is tortuous. The heart size is normal. Patchy consolidation of right lung base is noted. There is no pulmonary edema or pleural effusion. The bony structures are stable. IMPRESSION: Right lung base pneumonia. Electronically Signed   By: Abelardo Diesel M.D.   On: 10/10/2019 18:21

## 2019-10-13 DIAGNOSIS — I10 Essential (primary) hypertension: Secondary | ICD-10-CM | POA: Diagnosis not present

## 2019-10-13 DIAGNOSIS — E039 Hypothyroidism, unspecified: Secondary | ICD-10-CM | POA: Diagnosis not present

## 2019-10-13 DIAGNOSIS — J9601 Acute respiratory failure with hypoxia: Secondary | ICD-10-CM | POA: Diagnosis not present

## 2019-10-13 DIAGNOSIS — J189 Pneumonia, unspecified organism: Secondary | ICD-10-CM | POA: Diagnosis not present

## 2019-10-13 LAB — COMPREHENSIVE METABOLIC PANEL
ALT: 21 U/L (ref 0–44)
AST: 26 U/L (ref 15–41)
Albumin: 2.7 g/dL — ABNORMAL LOW (ref 3.5–5.0)
Alkaline Phosphatase: 50 U/L (ref 38–126)
Anion gap: 10 (ref 5–15)
BUN: 25 mg/dL — ABNORMAL HIGH (ref 8–23)
CO2: 23 mmol/L (ref 22–32)
Calcium: 8.7 mg/dL — ABNORMAL LOW (ref 8.9–10.3)
Chloride: 99 mmol/L (ref 98–111)
Creatinine, Ser: 0.66 mg/dL (ref 0.44–1.00)
GFR calc Af Amer: >60 mL/min (ref >60)
GFR calc non Af Amer: >60 mL/min (ref >60)
Glucose, Bld: 196 mg/dL — ABNORMAL HIGH (ref 70–99)
Potassium: 4.2 mmol/L (ref 3.5–5.1)
Sodium: 132 mmol/L — ABNORMAL LOW (ref 135–145)
Total Bilirubin: 0.7 mg/dL (ref 0.3–1.2)
Total Protein: 6.1 g/dL — ABNORMAL LOW (ref 6.5–8.1)

## 2019-10-13 LAB — BPAM FFP
Blood Product Expiration Date: 202010301240
ISSUE DATE / TIME: 202010291323
Unit Type and Rh: 6200

## 2019-10-13 LAB — CBC WITH DIFFERENTIAL/PLATELET
Abs Immature Granulocytes: 0.57 10*3/uL — ABNORMAL HIGH (ref 0.00–0.07)
Basophils Absolute: 0 10*3/uL (ref 0.0–0.1)
Basophils Relative: 0 %
Eosinophils Absolute: 0 10*3/uL (ref 0.0–0.5)
Eosinophils Relative: 0 %
HCT: 33.1 % — ABNORMAL LOW (ref 36.0–46.0)
Hemoglobin: 11.2 g/dL — ABNORMAL LOW (ref 12.0–15.0)
Immature Granulocytes: 5 %
Lymphocytes Relative: 6 %
Lymphs Abs: 0.6 10*3/uL — ABNORMAL LOW (ref 0.7–4.0)
MCH: 29.6 pg (ref 26.0–34.0)
MCHC: 33.8 g/dL (ref 30.0–36.0)
MCV: 87.3 fL (ref 80.0–100.0)
Monocytes Absolute: 1 10*3/uL (ref 0.1–1.0)
Monocytes Relative: 9 %
Neutro Abs: 8.7 10*3/uL — ABNORMAL HIGH (ref 1.7–7.7)
Neutrophils Relative %: 80 %
Platelets: 354 10*3/uL (ref 150–400)
RBC: 3.79 MIL/uL — ABNORMAL LOW (ref 3.87–5.11)
RDW: 11.9 % (ref 11.5–15.5)
WBC: 10.9 10*3/uL — ABNORMAL HIGH (ref 4.0–10.5)
nRBC: 0 % (ref 0.0–0.2)

## 2019-10-13 LAB — D-DIMER, QUANTITATIVE: D-Dimer, Quant: 0.64 ug/mL-FEU — ABNORMAL HIGH (ref 0.00–0.50)

## 2019-10-13 LAB — PREPARE FRESH FROZEN PLASMA

## 2019-10-13 LAB — MAGNESIUM: Magnesium: 2.1 mg/dL (ref 1.7–2.4)

## 2019-10-13 LAB — FERRITIN: Ferritin: 267 ng/mL (ref 11–307)

## 2019-10-13 LAB — C-REACTIVE PROTEIN: CRP: 6.8 mg/dL — ABNORMAL HIGH (ref <1.0)

## 2019-10-13 MED ORDER — FUROSEMIDE 10 MG/ML IJ SOLN
20.0000 mg | Freq: Once | INTRAMUSCULAR | Status: AC
  Administered 2019-10-13: 20 mg via INTRAVENOUS
  Filled 2019-10-13: qty 2

## 2019-10-13 NOTE — Progress Notes (Signed)
Occupational Therapy Evaluation Patient Details Name: Kelsey Ruiz MRN: [id number] DOB: May 23, 1929 Today's Date: 10/13/2019    History of Present Illness 85 y.o. female with PMHx of HTN, hypothyroidism, GERD-presenting to the ED with 1 week history of cough, shortness of breath-she was found to have acute hypoxic respiratory failure in the setting of COVID-19 pneumonia   Clinical Impression   PTA, pt lived at home with her daughter and was modified independent with ADL and mobility @ rollator level. Pt able to ambulate into bathroom and complete ADL with Mod A at times dueto fatigue. Desat to 81 on 2L with rebound to high 80s/low 90s  with rest and pursed lip breathing. Recommend HHOT. Will follow acutely.     Follow Up Recommendations  Home health OT;Supervision/Assistance - 24 hour(initially)    Equipment Recommendations  None recommended by OT    Recommendations for Other Services       Precautions / Restrictions Precautions Precautions: Fall Precaution Comments: monitor O2 Restrictions Weight Bearing Restrictions: No      Mobility Bed Mobility Overal bed mobility: Needs Assistance Bed Mobility: Rolling;Sit to Supine;Supine to Sit Rolling: Supervision   Supine to sit: Supervision Sit to supine: Supervision   General bed mobility comments:  Transfers Overall transfer level: Needs assistance   Transfers: Sit to/from Stand;Stand Pivot Transfers Sit to Stand: Min guard Stand pivot transfers: Supervision       General transfer comment: again needs supervision of lines and sats    Balance Overall balance assessment: Needs assistance Sitting-balance support: Feet unsupported Sitting balance-Leahy Scale: Good Sitting balance - Comments: able to get from bed to [facility] w/ SBA   Standing balance support: During functional activity;Single extremity supported Standing balance-Leahy Scale: Fair                             ADL either performed or  assessed with clinical judgement   ADL Overall ADL's : Needs assistance/impaired Eating/Feeding: Independent   Grooming: Set up;Sitting;Standing   Upper Body Bathing: Set up;Sitting   Lower Body Bathing: Minimal assistance;Sit to/from stand   Upper Body Dressing : Set up;Sitting   Lower Body Dressing: Moderate assistance;Sit to/from stand   Toilet Transfer: Minimal assistance;RW;Ambulation   Toileting- Water quality scientist and Hygiene: Min guard;Sit to/from stand;Sitting/lateral lean       Functional mobility during ADLs: Minimal assistance;Rolling walker;Cueing for safety General ADL Comments: Desats to 81 on 2L during sink ADL. VC for pursed lip breathing and to not talk in order to improve O2 sats     Vision         Perception     Praxis      Pertinent Vitals/Pain Pain Assessment: Faces Faces Pain Scale: Hurts little more Pain Location: in chest with coughing Pain Descriptors / Indicators: Aching;Burning Pain Intervention(s): Limited activity within patient's tolerance     Hand Dominance Right   Extremity/Trunk Assessment Upper Extremity Assessment Upper Extremity Assessment: Generalized weakness   Lower Extremity Assessment Lower Extremity Assessment: Defer to PT evaluation   Cervical / Trunk Assessment Cervical / Trunk Assessment: Normal   Communication Communication Communication: No difficulties   Cognition Arousal/Alertness: Awake/alert Behavior During Therapy: WFL for tasks assessed/performed Overall Cognitive Status: Within Functional Limits for tasks assessed                                 General Comments: most likely baseline  memory deficits; repeated herself several times   General Comments       Exercises Exercises: Other exercises Other Exercises Other Exercises: reinforced use of flutter valave x 10 reaps with cues for proper use Other Exercises: use of incentive spirometer x 5 reps with cues for proper use    Shoulder Instructions      Home Living Family/patient expects to be discharged to:: Private residence Living Arrangements: Children Available Help at Discharge: Family Type of Home: House Home Access: Stairs to enter     Home Layout: One level     Bathroom Shower/Tub: Astronomer Accessibility: Yes How Accessible: Accessible via walker Home Equipment: Environmental consultant - 2 wheels;Walker - 4 wheels;Shower seat;Bedside commode;Grab bars - toilet;Grab bars - tub/shower;Wheelchair - manual          Prior Functioning/Environment Level of Independence: Independent with assistive device(s)        Comments: was living at home and shared resposibilities of home with daughters        OT Problem List: Decreased strength;Decreased activity tolerance;Impaired balance (sitting and/or standing);Decreased safety awareness;Decreased knowledge of use of DME or AE;Cardiopulmonary status limiting activity;Pain      OT Treatment/Interventions: Self-care/ADL training;Therapeutic exercise;Neuromuscular education;Energy conservation;DME and/or AE instruction;Therapeutic activities;Cognitive remediation/compensation;Balance training;Patient/family education    OT Goals(Current goals can be found in the care plan section) Acute Rehab OT Goals Patient Stated Goal: get stronger OT Goal Formulation: With patient Time For Goal Achievement: 10/27/19 Potential to Achieve Goals: Good  OT Frequency: Min 3X/week   Barriers to D/C:            Co-evaluation              AM-PAC OT "6 Clicks" Daily Activity     Outcome Measure Help from another person eating meals?: None Help from another person taking care of personal grooming?: A Little Help from another person toileting, which includes using toliet, bedpan, or urinal?: A Little Help from another person bathing (including washing, rinsing, drying)?: A Lot Help from another person to put on and taking off regular upper body clothing?:  A Little Help from another person to put on and taking off regular lower body clothing?: A Lot 6 Click Score: 17   End of Session Equipment Utilized During Treatment: Rolling walker;Oxygen(2L) Nurse Communication: Mobility status  Activity Tolerance: Patient tolerated treatment well Patient left: in chair;with call bell/phone within reach  OT Visit Diagnosis: Unsteadiness on feet (R26.81);Muscle weakness (generalized) (M62.81);Pain Pain - part of body: (chest/breathing)                Time: 0915-1000 OT Time Calculation (min): 45 min Charges:  OT General Charges $OT Visit: 1 Visit OT Evaluation $OT Eval Moderate Complexity: 1 Mod OT Treatments $Self Care/Home Management : 23-37 mins  Maurie Boettcher, OT/L   Acute OT Clinical Specialist [place] Pager [phone] Office [phone]   [facility] 10/13/2019, 1:54 PM

## 2019-10-13 NOTE — Progress Notes (Signed)
PT Cancellation Note  Patient Details Name: Kelsey Ruiz MRN: [id number] DOB: [birth year]   Cancelled Treatment:    Pt declined stating she had a very productive morning and is so wiped out and unable to participate in pm tx. Will f/u at next available time.   Horald Chestnut, PT    Delford Field 10/13/2019, 4:07 PM

## 2019-10-13 NOTE — Progress Notes (Signed)
PROGRESS NOTE                                                                                                                                                                                                             Patient Demographics:    Kelsey Ruiz, is a 85 y.o. female, DOB - [birth year], GX:[id number]  Outpatient Primary MD for the patient is Sharilyn Sites, MD   Admit date - 10/10/2019   LOS - 3  Chief Complaint  Patient presents with  . Shortness of Breath       Brief Narrative: Patient is a 85 y.o. female with PMHx of HTN, hypothyroidism, GERD-presenting to the ED with 1 week history of cough, shortness of breath-she was found to have acute hypoxic respiratory failure in the setting of COVID-19 pneumonia.  See below for further details.   Subjective:    Kelsey Ruiz feels better-she ambulated with therapy yesterday.   Assessment  & Plan :   Acute Hypoxic Resp Failure due to Covid 19 Viral pneumonia: Feels better-remains on 2 L of oxygen.  Inflammatory markers have down trended-continue steroids and remdesivir.  Patient is s/p convalescent plasma on 10/29.  Have asked nursing staff to titrate off oxygen as tolerated.    Fever: afebrile  O2 requirements: On 2 l/m  COVID-19 Labs: Recent Labs    10/10/19 1741 10/11/19 0520 10/12/19 0155 10/13/19 0130  DDIMER 1.49* 1.29* 0.89* 0.64*  FERRITIN 197 236 275 267  LDH 165  --   --   --   CRP 16.6* 15.6* 14.4* 6.8*    Lab Results  Component Value Date   SARSCOV2NAA POSITIVE (A) 10/10/2019   SARSCOV2NAA POSITIVE (A) 10/10/2019     COVID-19 Medications: Steroids: 10/27>> Remdesivir: 10/27 Actemra: See above Convalescent Plasma: X10/29 Research Studies:N/A  Other medications: Diuretics:Euvolemic-repeat Lasix x1 today to maintain negative balance.   Antibiotics: Not indicated-no evidence of bacterial infection.  Prone/Incentive Spirometry:  encouraged patient to lie prone for 3-4 hours at a time for a total of 16 hours a day, and to encourage incentive spirometry use 3-4/hour.  DVT Prophylaxis  :  Lovenox   Hypothyroidism: Continue Synthroid  Hypokalemia/hypomagnesemia: Repleted-follow  HTN: BP controlled without the use of any antihypertensive medications for now.    ABG:    Component Value Date/Time   TCO2 30 11/17/2018 0853  Vent Settings: N/A   Condition - Stable  Family Communication  : Spoke with daughter over the phone on 10/30  Code Status :  Full Code  Diet :  Diet Order            Diet regular Room service appropriate? Yes; Fluid consistency: Thin  Diet effective now               Disposition Plan  :  Remain hospitalized  Barriers to discharge: Hypoxia requiring O2 supplementation/complete 5 days of IV Remdesivir  Consults  :  None  Procedures  :  None  Antibiotics  :    Anti-infectives (From admission, onward)   Start     Dose/Rate Route Frequency Ordered Stop   10/12/19 1000  remdesivir 100 mg in sodium chloride 0.9 % 250 mL IVPB     100 mg 500 mL/hr over 30 Minutes Intravenous Every 24 hours 10/11/19 0323 10/16/19 0959   10/11/19 0130  remdesivir 200 mg in sodium chloride 0.9 % 250 mL IVPB     200 mg 500 mL/hr over 30 Minutes Intravenous Once 10/11/19 0006 10/11/19 0222   10/10/19 1930  cefTRIAXone (ROCEPHIN) 1 g in sodium chloride 0.9 % 100 mL IVPB     1 g 200 mL/hr over 30 Minutes Intravenous  Once 10/10/19 1921 10/10/19 2012   10/10/19 1930  azithromycin (ZITHROMAX) 500 mg in sodium chloride 0.9 % 250 mL IVPB     500 mg 250 mL/hr over 60 Minutes Intravenous  Once 10/10/19 1921 10/10/19 2113      Inpatient Medications  Scheduled Meds: . benzonatate  200 mg Oral TID  . enoxaparin (LOVENOX) injection  40 mg Subcutaneous Q24H  . levothyroxine  75 mcg Oral Q0600  . methylPREDNISolone (SOLU-MEDROL) injection  40 mg Intravenous Q12H  . sodium chloride flush  3 mL Intravenous  Q12H   Continuous Infusions: . sodium chloride    . remdesivir 100 mg in NS 250 mL 100 mg (10/13/19 1059)   PRN Meds:.sodium chloride, acetaminophen **OR** acetaminophen, chlorpheniramine-HYDROcodone, HYDROcodone-acetaminophen, ondansetron **OR** ondansetron (ZOFRAN) IV, polyethylene glycol, sodium chloride flush   Time Spent in minutes  25  See all Orders from today for further details   Oren Binet M.D on 10/13/2019 at 11:22 AM  To page go to [email or web address] - use universal password  [facility] -  Office  [phone]    Objective:   Vitals:   10/12/19 1622 10/12/19 1909 10/13/19 0548 10/13/19 0758  BP: 134/69 129/73 (!) 146/74 (!) 145/74  Pulse: 81 74 80 76  Resp: (!) 23 16 18 20   Temp: 98.7 F (37.1 C) 98 F (36.7 C) 98.2 F (36.8 C) 98.2 F (36.8 C)  TempSrc: Oral   Oral  SpO2: 100% 96% 92% 94%  Weight:      Height:        Wt Readings from Last 3 Encounters:  10/10/19 56.7 kg  11/17/18 65.8 kg  09/17/18 64.4 kg     Intake/Output Summary (Last 24 hours) at 10/13/2019 1122 Last data filed at 10/13/2019 0600 Gross per 24 hour  Intake 855.42 ml  Output -  Net 855.42 ml     Physical Exam Gen Exam:Alert awake-not in any distress HEENT:atraumatic, normocephalic Chest: B/L clear to auscultation anteriorly CVS:S1S2 regular Abdomen:soft non tender, non distended Extremities:no edema Neurology: Non focal Skin: no rash   Data Review:    CBC Recent Labs  Lab 10/10/19 1741 10/11/19 0520 10/12/19 0155 10/13/19 0130  WBC 13.8*  12.6* 8.0 10.9*  HGB 12.3 11.5* 11.8* 11.2*  HCT 38.5 35.6* 35.8* 33.1*  PLT 243 285 304 354  MCV 90.8 91.0 88.6 87.3  MCH 29.0 29.4 29.2 29.6  MCHC 31.9 32.3 33.0 33.8  RDW 12.7 12.7 12.1 11.9  LYMPHSABS 0.4* 0.8 0.6* 0.6*  MONOABS 1.1* 1.2* 0.5 1.0  EOSABS 0.0 0.0 0.0 0.0  BASOSABS 0.0 0.0 0.0 0.0    Chemistries  Recent Labs  Lab 10/10/19 1741 10/11/19 0520 10/12/19 0155 10/13/19 0130  NA 132* 136  130* 132*  K 3.3* 3.9 4.1 4.2  CL 100 102 98 99  CO2 23 22 22 23   GLUCOSE 116* 97 170* 196*  BUN 15 13 23  25*  CREATININE 0.56 0.56 0.64 0.66  CALCIUM 8.5* 8.4* 8.5* 8.7*  MG  --  1.9 2.4 2.1  AST 20 23 22 26   ALT 18 16 18 21   ALKPHOS 56 52 52 50  BILITOT 0.9 0.3 0.6 0.7   ------------------------------------------------------------------------------------------------------------------ Recent Labs    10/10/19 1741  TRIG 80    No results found for: HGBA1C ------------------------------------------------------------------------------------------------------------------ No results for input(s): TSH, T4TOTAL, T3FREE, THYROIDAB in the last 72 hours.  Invalid input(s): FREET3 ------------------------------------------------------------------------------------------------------------------ Recent Labs    10/12/19 0155 10/13/19 0130  FERRITIN 275 267    Coagulation profile No results for input(s): INR, PROTIME in the last 168 hours.  Recent Labs    10/12/19 0155 10/13/19 0130  DDIMER 0.89* 0.64*    Cardiac Enzymes No results for input(s): CKMB, TROPONINI, MYOGLOBIN in the last 168 hours.  Invalid input(s): CK ------------------------------------------------------------------------------------------------------------------ No results found for: BNP  Micro Results Recent Results (from the past 240 hour(s))  SARS CORONAVIRUS 2 (TAT 6-24 HRS) Nasopharyngeal Nasopharyngeal Swab     Status: Abnormal   Collection Time: 10/10/19  5:06 PM   Specimen: Nasopharyngeal Swab  Result Value Ref Range Status   SARS Coronavirus 2 POSITIVE (A) NEGATIVE Final    Comment: RESULT CALLED TO, READ BACK BY AND VERIFIED WITH: DOSS M, RN AT 0101 ON 10/11/2019 BY SAINVILUS S (NOTE) SARS-CoV-2 target nucleic acids are DETECTED. The SARS-CoV-2 RNA is generally detectable in upper and lower respiratory specimens during the acute phase of infection. Positive results are indicative of active  infection with SARS-CoV-2. Clinical  correlation with patient history and other diagnostic information is necessary to determine patient infection status. Positive results do  not rule out bacterial infection or co-infection with other viruses. The expected result is Negative. Fact Sheet for Patients: SugarRoll.be Fact Sheet for Healthcare Providers: https://www.woods-mathews.com/ This test is not yet approved or cleared by the [place] FDA and  has been authorized for detection and/or diagnosis of SARS-CoV-2 by FDA under an Emergency Use Authorization (EUA). This EUA will remain  in effect (meaning this test can be  used) for the duration of the COVID-19 declaration under Section 564(b)(1) of the Act, 21 U.S.C. section 360bbb-3(b)(1), unless the authorization is terminated or revoked sooner. Performed at [facility], [place] [address], [place], [place] [address]   Blood Culture (routine x 2)     Status: None (Preliminary result)   Collection Time: 10/10/19  5:41 PM   Specimen: Right Antecubital; Blood  Result Value Ref Range Status   Specimen Description RIGHT ANTECUBITAL  Final   Special Requests   Final    Blood Culture adequate volume BOTTLES DRAWN AEROBIC AND ANAEROBIC   Culture   Final    NO GROWTH 3 DAYS Performed at [facility],  [address] [place], [place] [address]    Report Status PENDING  Incomplete  Blood Culture (routine x 2)     Status: None (Preliminary result)   Collection Time: 10/10/19  5:55 PM   Specimen: BLOOD RIGHT WRIST  Result Value Ref Range Status   Specimen Description BLOOD RIGHT WRIST  Final   Special Requests   Final    BOTTLES DRAWN AEROBIC AND ANAEROBIC Blood Culture adequate volume   Culture   Final    NO GROWTH 3 DAYS Performed at [facility], [address], [place], [place] [address]    Report Status PENDING  Incomplete  SARS Coronavirus 2 by RT PCR (hospital order, performed in [place] hospital lab) Nasopharyngeal Nasopharyngeal Swab     Status: Abnormal   Collection Time: 10/10/19  7:36 PM   Specimen: Nasopharyngeal Swab  Result Value Ref Range Status   SARS Coronavirus 2 POSITIVE (A) NEGATIVE Final    Comment: RESULT CALLED TO, READ BACK BY AND VERIFIED WITH: NICKELS,K AT 2226 ON 10.27.20 BY ISLEY,B (NOTE) If result is NEGATIVE SARS-CoV-2 target nucleic acids are NOT DETECTED. The SARS-CoV-2 RNA is generally detectable in upper and lower  respiratory specimens during the acute phase of infection. The lowest  concentration of SARS-CoV-2 viral copies this assay can detect is 250  copies / mL. A negative result does not preclude SARS-CoV-2 infection  and should not be used as the sole basis for treatment or other  patient management decisions.  A negative result may occur with  improper specimen collection / handling, submission of specimen other  than nasopharyngeal swab, presence of viral mutation(s) within the  areas targeted by this assay, and inadequate number of viral copies  (<250 copies / mL). A negative result must be combined with clinical  observations, patient history, and epidemiological information. If result is POSITIVE SARS-CoV-2 target nucleic acids are DETECTE D. The SARS-CoV-2 RNA is generally detectable in upper and lower  respiratory specimens during the acute phase of infection.  Positive  results are indicative of active infection with SARS-CoV-2.  Clinical  correlation with patient history and other diagnostic information is  necessary to determine patient infection status.  Positive results do  not rule out bacterial infection or co-infection with other viruses. If result is PRESUMPTIVE POSTIVE SARS-CoV-2 nucleic acids MAY BE PRESENT.   A presumptive positive result was obtained on the submitted specimen  and confirmed on repeat testing.  While 2019 novel coronavirus  (SARS-CoV-2) nucleic acids may be present in the submitted sample   additional confirmatory testing may be necessary for epidemiological  and / or clinical management purposes  to differentiate between  SARS-CoV-2 and other Sarbecovirus currently known to infect humans.  If clinically indicated additional testing with an alternate test  methodology (LAB745 3) is advised. The SARS-CoV-2 RNA is generally  detectable in upper and lower respiratory specimens during the acute  phase of infection. The expected result is Negative. Fact Sheet for Patients:  StrictlyIdeas.no Fact Sheet for Healthcare Providers: BankingDealers.co.za This test is not yet approved or cleared by the [place] FDA and has been authorized for detection and/or diagnosis of SARS-CoV-2 by FDA under an Emergency Use Authorization (EUA).  This EUA will remain in effect (meaning this test can be used) for the duration of the COVID-19 declaration under Section 564(b)(1) of the Act, 21 U.S.C. section 360bbb-3(b)(1), unless the authorization is terminated or revoked sooner. Performed at [facility], [address] [place],  [address]  Radiology Reports Dg Chest Port 1 View  Result Date: 10/10/2019 CLINICAL DATA:  Fever, cough, shortness of breath for 1 week EXAM: PORTABLE CHEST 1 VIEW COMPARISON:  April 05, 2014 FINDINGS: The aorta is tortuous. The heart size is normal. Patchy consolidation of right lung base is noted. There is no pulmonary edema or pleural effusion. The bony structures are stable. IMPRESSION: Right lung base pneumonia. Electronically Signed   By: Abelardo Diesel M.D.   On: 10/10/2019 18:21

## 2019-10-14 DIAGNOSIS — J9601 Acute respiratory failure with hypoxia: Secondary | ICD-10-CM | POA: Diagnosis not present

## 2019-10-14 DIAGNOSIS — E039 Hypothyroidism, unspecified: Secondary | ICD-10-CM | POA: Diagnosis not present

## 2019-10-14 DIAGNOSIS — M810 Age-related osteoporosis without current pathological fracture: Secondary | ICD-10-CM | POA: Diagnosis not present

## 2019-10-14 DIAGNOSIS — I10 Essential (primary) hypertension: Secondary | ICD-10-CM | POA: Diagnosis not present

## 2019-10-14 DIAGNOSIS — M1611 Unilateral primary osteoarthritis, right hip: Secondary | ICD-10-CM | POA: Diagnosis not present

## 2019-10-14 DIAGNOSIS — J189 Pneumonia, unspecified organism: Secondary | ICD-10-CM | POA: Diagnosis not present

## 2019-10-14 DIAGNOSIS — E7849 Other hyperlipidemia: Secondary | ICD-10-CM | POA: Diagnosis not present

## 2019-10-14 LAB — CBC WITH DIFFERENTIAL/PLATELET
Abs Immature Granulocytes: 0.77 10*3/uL — ABNORMAL HIGH (ref 0.00–0.07)
Basophils Absolute: 0 10*3/uL (ref 0.0–0.1)
Basophils Relative: 0 %
Eosinophils Absolute: 0 10*3/uL (ref 0.0–0.5)
Eosinophils Relative: 0 %
HCT: 33.5 % — ABNORMAL LOW (ref 36.0–46.0)
Hemoglobin: 11.1 g/dL — ABNORMAL LOW (ref 12.0–15.0)
Immature Granulocytes: 7 %
Lymphocytes Relative: 5 %
Lymphs Abs: 0.6 10*3/uL — ABNORMAL LOW (ref 0.7–4.0)
MCH: 29 pg (ref 26.0–34.0)
MCHC: 33.1 g/dL (ref 30.0–36.0)
MCV: 87.5 fL (ref 80.0–100.0)
Monocytes Absolute: 1 10*3/uL (ref 0.1–1.0)
Monocytes Relative: 9 %
Neutro Abs: 8.6 10*3/uL — ABNORMAL HIGH (ref 1.7–7.7)
Neutrophils Relative %: 79 %
Platelets: 333 10*3/uL (ref 150–400)
RBC: 3.83 MIL/uL — ABNORMAL LOW (ref 3.87–5.11)
RDW: 11.9 % (ref 11.5–15.5)
WBC: 10.9 10*3/uL — ABNORMAL HIGH (ref 4.0–10.5)
nRBC: 0 % (ref 0.0–0.2)

## 2019-10-14 LAB — COMPREHENSIVE METABOLIC PANEL
ALT: 27 U/L (ref 0–44)
AST: 28 U/L (ref 15–41)
Albumin: 2.8 g/dL — ABNORMAL LOW (ref 3.5–5.0)
Alkaline Phosphatase: 50 U/L (ref 38–126)
Anion gap: 10 (ref 5–15)
BUN: 22 mg/dL (ref 8–23)
CO2: 24 mmol/L (ref 22–32)
Calcium: 8.8 mg/dL — ABNORMAL LOW (ref 8.9–10.3)
Chloride: 99 mmol/L (ref 98–111)
Creatinine, Ser: 0.67 mg/dL (ref 0.44–1.00)
GFR calc Af Amer: >60 mL/min (ref >60)
GFR calc non Af Amer: >60 mL/min (ref >60)
Glucose, Bld: 217 mg/dL — ABNORMAL HIGH (ref 70–99)
Potassium: 4 mmol/L (ref 3.5–5.1)
Sodium: 133 mmol/L — ABNORMAL LOW (ref 135–145)
Total Bilirubin: 0.5 mg/dL (ref 0.3–1.2)
Total Protein: 5.9 g/dL — ABNORMAL LOW (ref 6.5–8.1)

## 2019-10-14 LAB — D-DIMER, QUANTITATIVE: D-Dimer, Quant: 0.44 ug/mL-FEU (ref 0.00–0.50)

## 2019-10-14 LAB — FERRITIN: Ferritin: 209 ng/mL (ref 11–307)

## 2019-10-14 LAB — MAGNESIUM: Magnesium: 2.1 mg/dL (ref 1.7–2.4)

## 2019-10-14 LAB — C-REACTIVE PROTEIN: CRP: 3.7 mg/dL — ABNORMAL HIGH (ref <1.0)

## 2019-10-14 MED ORDER — HYDROCOD POLST-CPM POLST ER 10-8 MG/5ML PO SUER
5.0000 mL | Freq: Once | ORAL | Status: AC
  Administered 2019-10-14: 5 mL via ORAL
  Filled 2019-10-14: qty 5

## 2019-10-14 NOTE — Progress Notes (Signed)
Patient A&Ox4, states she has alre.dy updated family and no need for RN to call daughters/

## 2019-10-14 NOTE — Plan of Care (Signed)
Patient updated on plan of care, patient said she already updated family and said not to call

## 2019-10-14 NOTE — Progress Notes (Signed)
PROGRESS NOTE                                                                                                                                                                                                             Patient Demographics:    Kelsey Ruiz, is a 85 y.o. female, DOB - 1929-01-07, [id number]  Outpatient Primary MD for the patient is Sharilyn Sites, MD   Admit date - 10/10/2019   LOS - 4  Chief Complaint  Patient presents with   Shortness of Breath       Brief Narrative: Patient is a 85 y.o. female with PMHx of HTN, hypothyroidism, GERD-presenting to the ED with 1 week history of cough, shortness of breath-she was found to have acute hypoxic respiratory failure in the setting of COVID-19 pneumonia.  See below for further details.   Subjective:    [place] feels better-still with coughing spells at times.  Desaturates when she ambulates-but overall feels better than how she first came to the hospital with.  Was on room air the whole afternoon yesterday-claims that the nursing staff put her on oxygen this morning when she had a coughing spell.   Assessment  & Plan :   Acute Hypoxic Resp Failure due to Covid 19 Viral pneumonia: Overall slowly improving-inflammatory markers continue to downtrend-mostly on room air-at times requires around 1-2 L of oxygen.  Continue steroids-and remdesivir.  Patient is also s/p convalescent plasma on 10/29.   Fever: afebrile  O2 requirements: On 2 l/m  COVID-19 Labs: Recent Labs    10/12/19 0155 10/13/19 0130 10/14/19 0056  DDIMER 0.89* 0.64* 0.44  FERRITIN 275 267 209  CRP 14.4* 6.8* 3.7*    Lab Results  Component Value Date   SARSCOV2NAA POSITIVE (A) 10/10/2019   SARSCOV2NAA POSITIVE (A) 10/10/2019     COVID-19 Medications: Steroids: 10/27>> Remdesivir: 10/27 Actemra: See above Convalescent Plasma: X10/29 Research Studies:N/A  Other  medications: Diuretics:Euvolemic-repeat Lasix x1 today to maintain negative balance.   Antibiotics: Not indicated-no evidence of bacterial infection.  Prone/Incentive Spirometry: encouraged patient to lie prone for 3-4 hours at a time for a total of 16 hours a day, and to encourage incentive spirometry use 3-4/hour.  DVT Prophylaxis  :  Lovenox   Hypothyroidism: Continue Synthroid  Hypokalemia/hypomagnesemia: Repleted-follow  HTN: BP controlled without the use of any  antihypertensive medications for now.    ABG:    Component Value Date/Time   TCO2 30 11/17/2018 0853    Vent Settings: N/A   Condition - Stable  Family Communication  : Spoke with daughter over the phone on 10/31  Code Status :  Full Code  Diet :  Diet Order            Diet regular Room service appropriate? Yes; Fluid consistency: Thin  Diet effective now               Disposition Plan  :  Remain hospitalized  Barriers to discharge: Hypoxia requiring O2 supplementation/complete 5 days of IV Remdesivir  Consults  :  None  Procedures  :  None  Antibiotics  :    Anti-infectives (From admission, onward)   Start     Dose/Rate Route Frequency Ordered Stop   10/12/19 1000  remdesivir 100 mg in sodium chloride 0.9 % 250 mL IVPB     100 mg 500 mL/hr over 30 Minutes Intravenous Every 24 hours 10/11/19 0323 10/16/19 0959   10/11/19 0130  remdesivir 200 mg in sodium chloride 0.9 % 250 mL IVPB     200 mg 500 mL/hr over 30 Minutes Intravenous Once 10/11/19 0006 10/11/19 0222   10/10/19 1930  cefTRIAXone (ROCEPHIN) 1 g in sodium chloride 0.9 % 100 mL IVPB     1 g 200 mL/hr over 30 Minutes Intravenous  Once 10/10/19 1921 10/10/19 2012   10/10/19 1930  azithromycin (ZITHROMAX) 500 mg in sodium chloride 0.9 % 250 mL IVPB     500 mg 250 mL/hr over 60 Minutes Intravenous  Once 10/10/19 1921 10/10/19 2113      Inpatient Medications  Scheduled Meds:  benzonatate  200 mg Oral TID   enoxaparin (LOVENOX)  injection  40 mg Subcutaneous Q24H   levothyroxine  75 mcg Oral Q0600   methylPREDNISolone (SOLU-MEDROL) injection  40 mg Intravenous Q12H   sodium chloride flush  3 mL Intravenous Q12H   Continuous Infusions:  sodium chloride     remdesivir 100 mg in NS 250 mL 100 mg (10/14/19 0930)   PRN Meds:.sodium chloride, acetaminophen **OR** acetaminophen, chlorpheniramine-HYDROcodone, HYDROcodone-acetaminophen, ondansetron **OR** ondansetron (ZOFRAN) IV, polyethylene glycol, sodium chloride flush   Time Spent in minutes  25  See all Orders from today for further details   Oren Binet M.D on 10/14/2019 at 11:13 AM  To page go to [email or web address] - use universal password  [facility] -  Office  [phone]    Objective:   Vitals:   10/13/19 2004 10/14/19 0408 10/14/19 0505 10/14/19 0824  BP:   (!) 145/71 127/67  Pulse:   75 79  Resp: (!) 24  19 (!) 28  Temp:   98 F (36.7 C) 98.2 F (36.8 C)  TempSrc:   Oral Oral  SpO2:  90% (!) 88% (!) 89%  Weight:      Height:        Wt Readings from Last 3 Encounters:  10/10/19 56.7 kg  11/17/18 65.8 kg  09/17/18 64.4 kg     Intake/Output Summary (Last 24 hours) at 10/14/2019 1113 Last data filed at 10/14/2019 0900 Gross per 24 hour  Intake 260 ml  Output --  Net 260 ml     Physical Exam Gen Exam:Alert awake-not in any distress HEENT:atraumatic, normocephalic Chest: B/L clear to auscultation anteriorly CVS:S1S2 regular Abdomen:soft non tender, non distended Extremities:no edema Neurology: Non focal Skin: no rash   Data Review:  CBC Recent Labs  Lab 10/10/19 1741 10/11/19 0520 10/12/19 0155 10/13/19 0130 10/14/19 0056  WBC 13.8* 12.6* 8.0 10.9* 10.9*  HGB 12.3 11.5* 11.8* 11.2* 11.1*  HCT 38.5 35.6* 35.8* 33.1* 33.5*  PLT 243 285 304 354 333  MCV 90.8 91.0 88.6 87.3 87.5  MCH 29.0 29.4 29.2 29.6 29.0  MCHC 31.9 32.3 33.0 33.8 33.1  RDW 12.7 12.7 12.1 11.9 11.9  LYMPHSABS 0.4* 0.8 0.6* 0.6*  0.6*  MONOABS 1.1* 1.2* 0.5 1.0 1.0  EOSABS 0.0 0.0 0.0 0.0 0.0  BASOSABS 0.0 0.0 0.0 0.0 0.0    Chemistries  Recent Labs  Lab 10/10/19 1741 10/11/19 0520 10/12/19 0155 10/13/19 0130 10/14/19 0056  NA 132* 136 130* 132* 133*  K 3.3* 3.9 4.1 4.2 4.0  CL 100 102 98 99 99  CO2 23 22 22 23 24   GLUCOSE 116* 97 170* 196* 217*  BUN 15 13 23  25* 22  CREATININE 0.56 0.56 0.64 0.66 0.67  CALCIUM 8.5* 8.4* 8.5* 8.7* 8.8*  MG  --  1.9 2.4 2.1 2.1  AST 20 23 22 26 28   ALT 18 16 18 21 27   ALKPHOS 56 52 52 50 50  BILITOT 0.9 0.3 0.6 0.7 0.5   ------------------------------------------------------------------------------------------------------------------ No results for input(s): CHOL, HDL, LDLCALC, TRIG, CHOLHDL, LDLDIRECT in the last 72 hours.  No results found for: HGBA1C ------------------------------------------------------------------------------------------------------------------ No results for input(s): TSH, T4TOTAL, T3FREE, THYROIDAB in the last 72 hours.  Invalid input(s): FREET3 ------------------------------------------------------------------------------------------------------------------ Recent Labs    10/13/19 0130 10/14/19 0056  FERRITIN 267 209    Coagulation profile No results for input(s): INR, PROTIME in the last 168 hours.  Recent Labs    10/13/19 0130 10/14/19 0056  DDIMER 0.64* 0.44    Cardiac Enzymes No results for input(s): CKMB, TROPONINI, MYOGLOBIN in the last 168 hours.  Invalid input(s): CK ------------------------------------------------------------------------------------------------------------------ No results found for: BNP  Micro Results Recent Results (from the past 240 hour(s))  SARS CORONAVIRUS 2 (TAT 6-24 HRS) Nasopharyngeal Nasopharyngeal Swab     Status: Abnormal   Collection Time: 10/10/19  5:06 PM   Specimen: Nasopharyngeal Swab  Result Value Ref Range Status   SARS Coronavirus 2 POSITIVE (A) NEGATIVE Final    Comment:  RESULT CALLED TO, READ BACK BY AND VERIFIED WITH: DOSS M, RN AT 0101 ON 10/11/2019 BY SAINVILUS S (NOTE) SARS-CoV-2 target nucleic acids are DETECTED. The SARS-CoV-2 RNA is generally detectable in upper and lower respiratory specimens during the acute phase of infection. Positive results are indicative of active infection with SARS-CoV-2. Clinical  correlation with patient history and other diagnostic information is necessary to determine patient infection status. Positive results do  not rule out bacterial infection or co-infection with other viruses. The expected result is Negative. Fact Sheet for Patients: SugarRoll.be Fact Sheet for Healthcare Providers: https://www.woods-mathews.com/ This test is not yet approved or cleared by the [place] FDA and  has been authorized for detection and/or diagnosis of SARS-CoV-2 by FDA under an Emergency Use Authorization (EUA). This EUA will remain  in effect (meaning this test can be  used) for the duration of the COVID-19 declaration under Section 564(b)(1) of the Act, 21 U.S.C. section 360bbb-3(b)(1), unless the authorization is terminated or revoked sooner. Performed at [facility], [place] [address], [place], [place] [address]   Blood Culture (routine x 2)     Status: None (Preliminary result)   Collection Time: 10/10/19  5:41 PM   Specimen: Right Antecubital; Blood  Result Value  Ref Range Status   Specimen Description RIGHT ANTECUBITAL  Final   Special Requests   Final    Blood Culture adequate volume BOTTLES DRAWN AEROBIC AND ANAEROBIC   Culture   Final    NO GROWTH 4 DAYS Performed at [facility], [address] [place], [place] [address]    Report Status PENDING  Incomplete  Blood Culture (routine x 2)     Status: None (Preliminary result)   Collection Time: 10/10/19  5:55 PM   Specimen: BLOOD RIGHT WRIST  Result Value Ref Range Status   Specimen Description BLOOD RIGHT WRIST   Final   Special Requests   Final    BOTTLES DRAWN AEROBIC AND ANAEROBIC Blood Culture adequate volume   Culture   Final    NO GROWTH 4 DAYS Performed at [facility], [address], [place], [place] [address]    Report Status PENDING  Incomplete  SARS Coronavirus 2 by RT PCR (hospital order, performed in [place] hospital lab) Nasopharyngeal Nasopharyngeal Swab     Status: Abnormal   Collection Time: 10/10/19  7:36 PM   Specimen: Nasopharyngeal Swab  Result Value Ref Range Status   SARS Coronavirus 2 POSITIVE (A) NEGATIVE Final    Comment: RESULT CALLED TO, READ BACK BY AND VERIFIED WITH: NICKELS,K AT 2226 ON 10.27.20 BY ISLEY,B (NOTE) If result is NEGATIVE SARS-CoV-2 target nucleic acids are NOT DETECTED. The SARS-CoV-2 RNA is generally detectable in upper and lower  respiratory specimens during the acute phase of infection. The lowest  concentration of SARS-CoV-2 viral copies this assay can detect is 250  copies / mL. A negative result does not preclude SARS-CoV-2 infection  and should not be used as the sole basis for treatment or other  patient management decisions.  A negative result may occur with  improper specimen collection / handling, submission of specimen other  than nasopharyngeal swab, presence of viral mutation(s) within the  areas targeted by this assay, and inadequate number of viral copies  (<250 copies / mL). A negative result must be combined with clinical  observations, patient history, and epidemiological information. If result is POSITIVE SARS-CoV-2 target nucleic acids are DETECTE D. The SARS-CoV-2 RNA is generally detectable in upper and lower  respiratory specimens during the acute phase of infection.  Positive  results are indicative of active infection with SARS-CoV-2.  Clinical  correlation with patient history and other diagnostic information is  necessary to determine patient infection status.  Positive results do  not rule out bacterial  infection or co-infection with other viruses. If result is PRESUMPTIVE POSTIVE SARS-CoV-2 nucleic acids MAY BE PRESENT.   A presumptive positive result was obtained on the submitted specimen  and confirmed on repeat testing.  While 2019 novel coronavirus  (SARS-CoV-2) nucleic acids may be present in the submitted sample  additional confirmatory testing may be necessary for epidemiological  and / or clinical management purposes  to differentiate between  SARS-CoV-2 and other Sarbecovirus currently known to infect humans.  If clinically indicated additional testing with an alternate test  methodology (LAB745 3) is advised. The SARS-CoV-2 RNA is generally  detectable in upper and lower respiratory specimens during the acute  phase of infection. The expected result is Negative. Fact Sheet for Patients:  StrictlyIdeas.no Fact Sheet for Healthcare Providers: BankingDealers.co.za This test is not yet approved or cleared by the [place] FDA and has been authorized for detection and/or diagnosis of SARS-CoV-2 by FDA under an Emergency Use Authorization (EUA).  This  EUA will remain in effect (meaning this test can be used) for the duration of the COVID-19 declaration under Section 564(b)(1) of the Act, 21 U.S.C. section 360bbb-3(b)(1), unless the authorization is terminated or revoked sooner. Performed at [facility], [address] [place], [place] [address]     Radiology Reports Dg Chest [place] 1 View  Result Date: 10/10/2019 CLINICAL DATA:  Fever, cough, shortness of breath for 1 week EXAM: PORTABLE CHEST 1 VIEW COMPARISON:  April 05, 2014 FINDINGS: The aorta is tortuous. The heart size is normal. Patchy consolidation of right lung base is noted. There is no pulmonary edema or pleural effusion. The bony structures are stable. IMPRESSION: Right lung base pneumonia. Electronically Signed   By: Abelardo Diesel M.D.   On: 10/10/2019 18:21

## 2019-10-14 NOTE — TOC Initial Note (Signed)
Transition of Care [facility]) - Initial/Assessment Note    Patient Details  Name: Kelsey Ruiz MRN: [id number] Date of Birth: [birth year]  Transition of Care [name]) CM/SW Contact:    Ninfa Meeker, RN Phone Number: [phone] (working remotely) 10/14/2019, 1:33 PM  Clinical Narrative:    Patient is a 85 y.o. female with PMHx of HTN, hypothyroidism, GERD-presenting to the ED with 1 week history of cough, shortness of breath. Admitted for further treatment of acute hypoxic respiratory failure, COVID-19 pneumonia. Patient is receiving Remdesivir, IV steroids, oxygen at [facility]. Case manager spoke with patient briefly via telephone concerning discharge needs. She states she has RW,3in1, lift chair at home, asked that I contact her daughter concerning Home Health,she will agree with whatever daughter says. Case Manager contacted patient's daughter Kelsey [phone], discussed patient's need for [facility] and oxygen, choice for agency offered. Kelsey Ruiz said she agrees mom should have therapy for short time. Referral called to [facility], [place]. CM contacted Jeneen Rinks with [facility] for oxygen needs. Will continue to monitor.     Expected Discharge Plan: [place] Barriers to Discharge: Continued Medical Work up   Patient Goals and CMS Choice Patient states their goals for this hospitalization and ongoing recovery are:: to get better CMS Medicare.gov Compare Post Acute Care list provided to:: Patient Represenative (must comment)(daughter - Kelsey Ruiz) Choice offered to / list presented to : Adult Children(Daughter: Kelsey Ruiz)  Expected Discharge Plan and Services Expected Discharge Plan: [place]   Discharge Planning Services: CM Consult Post Acute Care Choice: [place] arrangements for the past 2 months: Single Family Home                 DME Arranged: N/A DME Agency: NA       HH Arranged: PT HH Agency: [place] [address]: 10/14/19 Time HH Agency Contacted: 14 Representative spoke with at [place]: [facility]  Prior Living Arrangements/Services Living arrangements for the past 2 months: [place] Lives with:: Adult Children Patient language and need for interpreter reviewed:: Yes Do you feel safe going back to the place where you live?: Yes      Need for Family Participation in Patient Care: Yes (Comment)     Criminal Activity/Legal Involvement Pertinent to Current Situation/Hospitalization: No - Comment as needed  Activities of Daily Living Home Assistive Devices/Equipment: Blood pressure cuff, Shower chair with back, Grab bars in shower ADL Screening (condition at time of admission) Patient's cognitive ability adequate to safely complete daily activities?: Yes Is the patient deaf or have difficulty hearing?: No Does the patient have difficulty seeing, even when wearing glasses/contacts?: No Does the patient have difficulty concentrating, remembering, or making decisions?: No Patient able to express need for assistance with ADLs?: Yes Does the patient have difficulty dressing or bathing?: No Independently performs ADLs?: Yes (appropriate for developmental age) Does the patient have difficulty walking or climbing stairs?: No Weakness of Legs: None Weakness of Arms/Hands: None  Permission Sought/Granted Permission sought to share information with : Case Manager Permission granted to share information with : Yes, Verbal Permission Granted     Permission granted to share info w AGENCY: [facility]        Emotional Assessment   Attitude/Demeanor/Rapport: Gracious   Orientation: : Oriented to Self, Oriented to Place, Oriented to  Time, Oriented to Situation Alcohol / Substance Use: Not Applicable Psych Involvement: No (comment)  Admission diagnosis:  Community acquired pneumonia of right lower lobe of lung [J18.9] Pneumonia [J18.9] Patient Active Problem List    Diagnosis Date Noted  . Pneumonia 10/10/2019  . Acute respiratory failure with hypoxia ([place]) 10/10/2019  . COVID-19 virus infection 10/10/2019  . Hypothyroidism   . Hypertension   . Abnormality of gait 06/21/2014  . Pain in joint, pelvic region and thigh 06/21/2014  . Stiffness of joint, not elsewhere classified, pelvic region and thigh 06/21/2014   PCP:  Sharilyn Sites, MD Pharmacy:   [place], [place] [id number] [place] [id number] Phone: [phone] Fax: [phone]     Social Determinants of Health (SDOH) Interventions    Readmission Risk Interventions No flowsheet data found.

## 2019-10-14 NOTE — Progress Notes (Signed)
Physical Therapy Treatment Patient Details Name: Kelsey Ruiz MRN: [id number] DOB: 11/02/1929 Today's Date: 10/14/2019    History of Present Illness 85 y.o. female with PMHx of HTN, hypothyroidism, GERD-presenting to the ED with 1 week history of cough, shortness of breath-she was found to have acute hypoxic respiratory failure in the setting of COVID-19 pneumonia    PT Comments    Pt was better able to participate in tx this pm. Even though she reports feeling fatigued from morning activities of bathing etc was agreeable to tx. Pt was found on room air and sating in low 90s, was able to get up from bed with SBA and ambulate to rest room, able to complete use and clean up with SBA and line management. Once completed agreeable to ambulation in hall. Ambulated approx 6ft with RW and SBA, noted few LOBs with ambulation needing min guard assist to correct and complete ambulation. Pt desat to min of 84% on room air with ambulation, once returned to room and back in bed very quickly recovered saturation to 90s again. Pt has been educated on importance of daily mobility and increasing mobility each day.      Follow Up Recommendations  Home health PT     Equipment Recommendations  None recommended by PT    Recommendations for Other Services       Precautions / Restrictions Precautions Precautions: Fall Precaution Comments: monitor O2 Restrictions Weight Bearing Restrictions: No    Mobility  Bed Mobility Overal bed mobility: Needs Assistance Bed Mobility: Supine to Sit;Sit to Supine     Supine to sit: Supervision Sit to supine: Supervision      Transfers Overall transfer level: Needs assistance Equipment used: Rolling walker (2 wheeled) Transfers: Sit to/from Stand Sit to Stand: Supervision         General transfer comment: able to stand from bed and also commode in rest room  Ambulation/Gait Ambulation/Gait assistance: Min guard Gait Distance (Feet): 96  Feet Assistive device: Rolling walker (2 wheeled) Gait Pattern/deviations: Step-through pattern Gait velocity: very slowly   General Gait Details: very slow, 1 LOBS noted needing min guard to correct, noted pt desat to min of 84% while ambulating, once back in room monitor reads 77% but once moved jumped to 90s and stayed in 90s.   Stairs             Wheelchair Mobility    Modified Rankin (Stroke Patients Only)       Balance Overall balance assessment: Needs assistance Sitting-balance support: Feet unsupported Sitting balance-Leahy Scale: Good     Standing balance support: During functional activity;Bilateral upper extremity supported Standing balance-Leahy Scale: Poor Standing balance comment: noted few LOBs with ambulation needing min guard to correct                            Cognition Arousal/Alertness: Awake/alert Behavior During Therapy: WFL for tasks assessed/performed Overall Cognitive Status: Within Functional Limits for tasks assessed                                        Exercises      General Comments General comments (skin integrity, edema, etc.): Pt did much better with mobility was able to get out of room and ambulate in hall briefly. pt has been on room air this pm and was able to remain on room  air, supp 02 on stand by, desat to min 84% with ambulation and was able to recover very fast once completed and back in bed. Pt needs to increase activity during day, she states she is very fatigued from taking shower brushing teeth daily, have discussed the importance of mobility with pt and also increasing mobility daily.      Pertinent Vitals/Pain Pain Assessment: Faces Faces Pain Scale: Hurts little more Pain Location: w/ coughing  Pain Intervention(s): Limited activity within patient's tolerance    Home Living                      Prior Function            PT Goals (current goals can now be found in the  care plan section) Acute Rehab PT Goals Patient Stated Goal: go home with family Time For Goal Achievement: 10/25/19 Potential to Achieve Goals: Good Progress towards PT goals: Progressing toward goals    Frequency    Min 3X/week      PT Plan Current plan remains appropriate    Co-evaluation              AM-PAC PT "6 Clicks" Mobility   Outcome Measure  Help needed turning from your back to your side while in a flat bed without using bedrails?: None Help needed moving from lying on your back to sitting on the side of a flat bed without using bedrails?: None Help needed moving to and from a bed to a chair (including a wheelchair)?: A Little Help needed standing up from a chair using your arms (e.g., wheelchair or bedside chair)?: A Little Help needed to walk in hospital room?: A Little Help needed climbing 3-5 steps with a railing? : A Lot 6 Click Score: 19    End of Session   Activity Tolerance: Treatment limited secondary to medical complications (Comment);Patient limited by fatigue Patient left: in bed;with call bell/phone within reach Nurse Communication: Mobility status(and need to increase mobility) PT Visit Diagnosis: Other abnormalities of gait and mobility (R26.89);Muscle weakness (generalized) (M62.81)     Time: [id number] PT Time Calculation (min) (ACUTE ONLY): 23 min  Charges:  $Gait Training: 8-22 mins $Therapeutic Activity: 8-22 mins                     Horald Chestnut, PT    Delford Field 10/14/2019, 4:14 PM

## 2019-10-15 LAB — CULTURE, BLOOD (ROUTINE X 2)
Culture: NO GROWTH
Culture: NO GROWTH
Special Requests: ADEQUATE
Special Requests: ADEQUATE

## 2019-10-15 LAB — CBC WITH DIFFERENTIAL/PLATELET
Abs Immature Granulocytes: 0.89 10*3/uL — ABNORMAL HIGH (ref 0.00–0.07)
Basophils Absolute: 0.1 10*3/uL (ref 0.0–0.1)
Basophils Relative: 0 %
Eosinophils Absolute: 0 10*3/uL (ref 0.0–0.5)
Eosinophils Relative: 0 %
HCT: 34.2 % — ABNORMAL LOW (ref 36.0–46.0)
Hemoglobin: 11.2 g/dL — ABNORMAL LOW (ref 12.0–15.0)
Immature Granulocytes: 8 %
Lymphocytes Relative: 6 %
Lymphs Abs: 0.7 10*3/uL (ref 0.7–4.0)
MCH: 29.2 pg (ref 26.0–34.0)
MCHC: 32.7 g/dL (ref 30.0–36.0)
MCV: 89.3 fL (ref 80.0–100.0)
Monocytes Absolute: 1.1 10*3/uL — ABNORMAL HIGH (ref 0.1–1.0)
Monocytes Relative: 10 %
Neutro Abs: 8.4 10*3/uL — ABNORMAL HIGH (ref 1.7–7.7)
Neutrophils Relative %: 76 %
Platelets: 312 10*3/uL (ref 150–400)
RBC: 3.83 MIL/uL — ABNORMAL LOW (ref 3.87–5.11)
RDW: 11.9 % (ref 11.5–15.5)
WBC: 11.2 10*3/uL — ABNORMAL HIGH (ref 4.0–10.5)
nRBC: 0 % (ref 0.0–0.2)

## 2019-10-15 LAB — COMPREHENSIVE METABOLIC PANEL
ALT: 30 U/L (ref 0–44)
AST: 28 U/L (ref 15–41)
Albumin: 2.8 g/dL — ABNORMAL LOW (ref 3.5–5.0)
Alkaline Phosphatase: 47 U/L (ref 38–126)
Anion gap: 13 (ref 5–15)
BUN: 20 mg/dL (ref 8–23)
CO2: 22 mmol/L (ref 22–32)
Calcium: 8.8 mg/dL — ABNORMAL LOW (ref 8.9–10.3)
Chloride: 98 mmol/L (ref 98–111)
Creatinine, Ser: 0.53 mg/dL (ref 0.44–1.00)
GFR calc Af Amer: >60 mL/min (ref >60)
GFR calc non Af Amer: >60 mL/min (ref >60)
Glucose, Bld: 224 mg/dL — ABNORMAL HIGH (ref 70–99)
Potassium: 4.4 mmol/L (ref 3.5–5.1)
Sodium: 133 mmol/L — ABNORMAL LOW (ref 135–145)
Total Bilirubin: 0.3 mg/dL (ref 0.3–1.2)
Total Protein: 5.9 g/dL — ABNORMAL LOW (ref 6.5–8.1)

## 2019-10-15 LAB — FERRITIN: Ferritin: 190 ng/mL (ref 11–307)

## 2019-10-15 LAB — MAGNESIUM: Magnesium: 2.2 mg/dL (ref 1.7–2.4)

## 2019-10-15 LAB — C-REACTIVE PROTEIN: CRP: 2 mg/dL — ABNORMAL HIGH (ref <1.0)

## 2019-10-15 LAB — D-DIMER, QUANTITATIVE: D-Dimer, Quant: 0.57 ug/mL-FEU — ABNORMAL HIGH (ref 0.00–0.50)

## 2019-10-15 MED ORDER — FUROSEMIDE 10 MG/ML IJ SOLN
40.0000 mg | Freq: Once | INTRAMUSCULAR | Status: AC
  Administered 2019-10-15: 15:00:00 40 mg via INTRAVENOUS
  Filled 2019-10-15: qty 4

## 2019-10-15 NOTE — Progress Notes (Signed)
Occupational Therapy Treatment Patient Details Name: Kelsey Ruiz MRN: [id number] DOB: [birth year] Today's Date: 10/15/2019    History of present illness 85 y.o. female with PMHx of HTN, hypothyroidism, GERD-presenting to the ED with 1 week history of cough, shortness of breath-she was found to have acute hypoxic respiratory failure in the setting of COVID-19 pneumonia   OT comments  Making steady progress. Completed ADL on RA with desat to 85. Improved to 90 with pursed lip breathing. After ADL session pt ambulated into hall using rollator. Desat into high 70s with HR in 140s. Sat to rest. Pt with apparent dicofort with SOB. Added 3L O2 to ambulate another 60 ft with SpO2 remaining above 88; HR 120s. Pt states she is feeling better and feels the oxygen helps her when she walks. Will continue to follow acutely.   Follow Up Recommendations  Home health OT;Supervision/Assistance - 24 hour    Equipment Recommendations  None recommended by OT    Recommendations for Other Services      Precautions / Restrictions Precautions Precautions: Fall Precaution Comments: monitor O2       Mobility Bed Mobility Overal bed mobility: Modified Independent                Transfers Overall transfer level: Needs assistance Equipment used: (rollator) Transfers: Sit to/from [facility] Sit to Stand: Supervision Stand pivot transfers: Supervision            Balance Overall balance assessment: Needs assistance   Sitting balance-Leahy Scale: Good       Standing balance-Leahy Scale: Poor Standing balance comment: reliant of external support                           ADL either performed or assessed with clinical judgement   ADL Overall ADL's : Needs assistance/impaired     Grooming: Set up;Sitting;Standing   Upper Body Bathing: Set up;Sitting   Lower Body Bathing: Minimal assistance;Sit to/from stand   Upper Body Dressing : Set up;Sitting    Lower Body Dressing: Minimal assistance;Sit to/from stand   Toilet Transfer: Supervision/safety;Ambulation;Regular Toilet;Grab bars(rollator)   Toileting- Clothing Manipulation and Hygiene: Supervision/safety       Functional mobility during ADLs: Min guard(rollator) General ADL Comments: desats to 85 on RA while completing ADL     Vision       Perception     Praxis      Cognition Arousal/Alertness: Awake/alert Behavior During Therapy: WFL for tasks assessed/performed Overall Cognitive Status: Within Functional Limits for tasks assessed                                          Exercises Other Exercises Other Exercises: reinforced use of flutter valave x 10 reaps with cues for proper use Other Exercises: use of incentive spirometer x 5 reps with cues for proper use   Shoulder Instructions       General Comments ambulated 40 ft; st and rested HR 140s desast to high 70s; apparent discomfot with breathing    Pertinent Vitals/ Pain       Pain Assessment: No/denies pain  Home Living  Prior Functioning/Environment              Frequency  Min 3X/week        Progress Toward Goals  OT Goals(current goals can now be found in the care plan section)  Progress towards OT goals: Progressing toward goals  Acute Rehab OT Goals Patient Stated Goal: go home with family OT Goal Formulation: With patient Time For Goal Achievement: 10/27/19 Potential to Achieve Goals: Good ADL Goals Pt Will Perform Lower Body Bathing: with modified independence;sit to/from stand Pt Will Perform Lower Body Dressing: with modified independence;sit to/from stand Pt Will Transfer to Toilet: with modified independence;ambulating Pt Will Perform Toileting - Clothing Manipulation and hygiene: with modified independence;sit to/from stand;sitting/lateral leans Additional ADL Goal #1: Pt will independently verbalize 3  energy conservation strategies Additional ADL Goal #2: Pt will independently verbalize 3 strategies to reduce risk of falls.  Plan Discharge plan remains appropriate    Co-evaluation                 AM-PAC OT "6 Clicks" Daily Activity     Outcome Measure   Help from another person eating meals?: None Help from another person taking care of personal grooming?: None Help from another person toileting, which includes using toliet, bedpan, or urinal?: A Little Help from another person bathing (including washing, rinsing, drying)?: A Little Help from another person to put on and taking off regular upper body clothing?: A Little Help from another person to put on and taking off regular lower body clothing?: A Little 6 Click Score: 20    End of Session Equipment Utilized During Treatment: Other (comment);Oxygen(2L rollator)  OT Visit Diagnosis: Unsteadiness on feet (R26.81);Muscle weakness (generalized) (M62.81);Pain   Activity Tolerance Patient tolerated treatment well   Patient Left in chair;with call bell/phone within reach   Nurse Communication Mobility status        Time: [id number] OT Time Calculation (min): 38 min  Charges: OT Treatments $Self Care/Home Management : 38-52 mins  Maurie Boettcher, OT/L   Acute OT Clinical Specialist Acute Rehabilitation Services Pager [phone] Office [phone]    [facility] 10/15/2019, 10:59 AM

## 2019-10-15 NOTE — Plan of Care (Signed)
Patient updated on plan of care, patient said she already updated her family

## 2019-10-15 NOTE — Progress Notes (Signed)
Pt worked w/ PT today & walked in Kelsey Ruiz, therefore has home O2 being set up.  Pt sat in chair for 3 hours today & worked w/ her IS/flutter valve throughout the day. Pt ate 50-75% of meals  -40mg  IV lasix given this afternoon good output, see flowsheet  -IV Remdesivir given  -IV solumedrol  *Pt daughter Arbie Cookey was update over the phone this afternoon. All questions & concerns addressed.

## 2019-10-15 NOTE — Progress Notes (Signed)
PROGRESS NOTE                                                                                                                                                                                                             Patient Demographics:    Kelsey Ruiz, is a 85 y.o. female, DOB - 02/02/1929, [id number]  Outpatient Primary MD for the patient is Sharilyn Sites, MD   Admit date - 10/10/2019   LOS - 5  Chief Complaint  Patient presents with   Shortness of Breath       Brief Narrative: Patient is a 85 y.o. female with PMHx of HTN, hypothyroidism, GERD-presenting to the ED with 1 week history of cough, shortness of breath-she was found to have acute hypoxic respiratory failure in the setting of COVID-19 pneumonia.  See below for further details.   Subjective:    [place] continues to make very slow clinical improvement-on mostly room air at rest-but desaturates down to the 70s and becomes tachycardic with ambulation.   Assessment  & Plan :   Acute Hypoxic Resp Failure due to Covid 19 Viral pneumonia: Slow clinical improvement continues-we will complete remdesivir later today.  Continue steroids.  Patient is s/p convalescent plasma on 10/29.  Given significant desaturation with ambulation-we will require inpatient monitoring before consideration of discharge.  Fever: afebrile  O2 requirements: On 2 l/m  COVID-19 Labs: Recent Labs    10/13/19 0130 10/14/19 0056 10/15/19 0000  DDIMER 0.64* 0.44 0.57*  FERRITIN 267 209 190  CRP 6.8* 3.7* 2.0*    Lab Results  Component Value Date   SARSCOV2NAA POSITIVE (A) 10/10/2019   SARSCOV2NAA POSITIVE (A) 10/10/2019     COVID-19 Medications: Steroids: 10/27>> Remdesivir: 10/27>> 11/1 Actemra: See above Convalescent Plasma: X10/29 Research Studies:N/A  Other medications: Diuretics:Euvolemic-repeat Lasix x1 today. Antibiotics: Not indicated-no evidence  of bacterial infection.  Prone/Incentive Spirometry: encouraged patient to lie prone for 3-4 hours at a time for a total of 16 hours a day, and to encourage incentive spirometry use 3-4/hour.  DVT Prophylaxis  :  Lovenox   Hypothyroidism: Continue Synthroid  Hypokalemia/hypomagnesemia: Repleted-follow  HTN: BP controlled without the use of any antihypertensive medications for now.    ABG:    Component Value Date/Time   TCO2 30 11/17/2018 0853    Vent Settings: N/A   Condition -  Stable  Family Communication  : Spoke with daughter over the phone on 11/1  Code Status :  Full Code  Diet :  Diet Order            Diet regular Room service appropriate? Yes; Fluid consistency: Thin  Diet effective now               Disposition Plan  :  Remain hospitalized  Barriers to discharge: Hypoxia requiring O2 supplementation/complete 5 days of IV Remdesivir  Consults  :  None  Procedures  :  None  Antibiotics  :    Anti-infectives (From admission, onward)   Start     Dose/Rate Route Frequency Ordered Stop   10/12/19 1000  remdesivir 100 mg in sodium chloride 0.9 % 250 mL IVPB     100 mg 500 mL/hr over 30 Minutes Intravenous Every 24 hours 10/11/19 0323 10/16/19 0959   10/11/19 0130  remdesivir 200 mg in sodium chloride 0.9 % 250 mL IVPB     200 mg 500 mL/hr over 30 Minutes Intravenous Once 10/11/19 0006 10/11/19 0222   10/10/19 1930  cefTRIAXone (ROCEPHIN) 1 g in sodium chloride 0.9 % 100 mL IVPB     1 g 200 mL/hr over 30 Minutes Intravenous  Once 10/10/19 1921 10/10/19 2012   10/10/19 1930  azithromycin (ZITHROMAX) 500 mg in sodium chloride 0.9 % 250 mL IVPB     500 mg 250 mL/hr over 60 Minutes Intravenous  Once 10/10/19 1921 10/10/19 2113      Inpatient Medications  Scheduled Meds:  benzonatate  200 mg Oral TID   enoxaparin (LOVENOX) injection  40 mg Subcutaneous Q24H   levothyroxine  75 mcg Oral Q0600   methylPREDNISolone (SOLU-MEDROL) injection  40 mg  Intravenous Q12H   sodium chloride flush  3 mL Intravenous Q12H   Continuous Infusions:  sodium chloride     remdesivir 100 mg in NS 250 mL 100 mg (10/15/19 1040)   PRN Meds:.sodium chloride, acetaminophen **OR** acetaminophen, chlorpheniramine-HYDROcodone, HYDROcodone-acetaminophen, ondansetron **OR** ondansetron (ZOFRAN) IV, polyethylene glycol, sodium chloride flush   Time Spent in minutes  25  See all Orders from today for further details   Oren Binet M.D on 10/15/2019 at 11:09 AM  To page go to [email or web address] - use universal password  [facility] -  Office  [phone]    Objective:   Vitals:   10/14/19 1600 10/14/19 2022 10/15/19 0500 10/15/19 0900  BP: (!) 160/91 137/77 137/85 132/84  Pulse: 85 85 81   Resp: (!) 23 (!) 23 (!) 23   Temp: 98.8 F (37.1 C) 99.3 F (37.4 C) 98.1 F (36.7 C) 98.2 F (36.8 C)  TempSrc: Oral Oral Oral Oral  SpO2: 93% 92% 94%   Weight:      Height:        Wt Readings from Last 3 Encounters:  10/10/19 56.7 kg  11/17/18 65.8 kg  09/17/18 64.4 kg     Intake/Output Summary (Last 24 hours) at 10/15/2019 1109 Last data filed at 10/15/2019 0939 Gross per 24 hour  Intake 480 ml  Output --  Net 480 ml     Physical Exam Gen Exam:Alert awake-not in any distress HEENT:atraumatic, normocephalic Chest: B/L clear to auscultation anteriorly CVS:S1S2 regular Abdomen:soft non tender, non distended Extremities:no edema Neurology: Non focal Skin: no rash   Data Review:    CBC Recent Labs  Lab 10/11/19 0520 10/12/19 0155 10/13/19 0130 10/14/19 0056 10/15/19 0000  WBC 12.6* 8.0 10.9* 10.9* 11.2*  HGB 11.5* 11.8* 11.2* 11.1* 11.2*  HCT 35.6* 35.8* 33.1* 33.5* 34.2*  PLT 285 304 354 333 312  MCV 91.0 88.6 87.3 87.5 89.3  MCH 29.4 29.2 29.6 29.0 29.2  MCHC 32.3 33.0 33.8 33.1 32.7  RDW 12.7 12.1 11.9 11.9 11.9  LYMPHSABS 0.8 0.6* 0.6* 0.6* 0.7  MONOABS 1.2* 0.5 1.0 1.0 1.1*  EOSABS 0.0 0.0 0.0 0.0 0.0  BASOSABS  0.0 0.0 0.0 0.0 0.1    Chemistries  Recent Labs  Lab 10/11/19 0520 10/12/19 0155 10/13/19 0130 10/14/19 0056 10/15/19 0000  NA 136 130* 132* 133* 133*  K 3.9 4.1 4.2 4.0 4.4  CL 102 98 99 99 98  CO2 22 22 23 24 22   GLUCOSE 97 170* 196* 217* 224*  BUN 13 23 25* 22 20  CREATININE 0.56 0.64 0.66 0.67 0.53  CALCIUM 8.4* 8.5* 8.7* 8.8* 8.8*  MG 1.9 2.4 2.1 2.1 2.2  AST 23 22 26 28 28   ALT 16 18 21 27 30   ALKPHOS 52 52 50 50 47  BILITOT 0.3 0.6 0.7 0.5 0.3   ------------------------------------------------------------------------------------------------------------------ No results for input(s): CHOL, HDL, LDLCALC, TRIG, CHOLHDL, LDLDIRECT in the last 72 hours.  No results found for: HGBA1C ------------------------------------------------------------------------------------------------------------------ No results for input(s): TSH, T4TOTAL, T3FREE, THYROIDAB in the last 72 hours.  Invalid input(s): FREET3 ------------------------------------------------------------------------------------------------------------------ Recent Labs    10/14/19 0056 10/15/19 0000  FERRITIN 209 190    Coagulation profile No results for input(s): INR, PROTIME in the last 168 hours.  Recent Labs    10/14/19 0056 10/15/19 0000  DDIMER 0.44 0.57*    Cardiac Enzymes No results for input(s): CKMB, TROPONINI, MYOGLOBIN in the last 168 hours.  Invalid input(s): CK ------------------------------------------------------------------------------------------------------------------ No results found for: BNP  Micro Results Recent Results (from the past 240 hour(s))  SARS CORONAVIRUS 2 (TAT 6-24 HRS) Nasopharyngeal Nasopharyngeal Swab     Status: Abnormal   Collection Time: 10/10/19  5:06 PM   Specimen: Nasopharyngeal Swab  Result Value Ref Range Status   SARS Coronavirus 2 POSITIVE (A) NEGATIVE Final    Comment: RESULT CALLED TO, READ BACK BY AND VERIFIED WITH: DOSS M, RN AT 0101 ON  10/11/2019 BY SAINVILUS S (NOTE) SARS-CoV-2 target nucleic acids are DETECTED. The SARS-CoV-2 RNA is generally detectable in upper and lower respiratory specimens during the acute phase of infection. Positive results are indicative of active infection with SARS-CoV-2. Clinical  correlation with patient history and other diagnostic information is necessary to determine patient infection status. Positive results do  not rule out bacterial infection or co-infection with other viruses. The expected result is Negative. Fact Sheet for Patients: SugarRoll.be Fact Sheet for Healthcare Providers: https://www.woods-mathews.com/ This test is not yet approved or cleared by the [place] FDA and  has been authorized for detection and/or diagnosis of SARS-CoV-2 by FDA under an Emergency Use Authorization (EUA). This EUA will remain  in effect (meaning this test can be  used) for the duration of the COVID-19 declaration under Section 564(b)(1) of the Act, 21 U.S.C. section 360bbb-3(b)(1), unless the authorization is terminated or revoked sooner. Performed at [facility], [place] [address] [place], [place] [address]   Blood Culture (routine x 2)     Status: None (Preliminary result)   Collection Time: 10/10/19  5:41 PM   Specimen: Right Antecubital; Blood  Result Value Ref Range Status   Specimen Description RIGHT ANTECUBITAL  Final   Special Requests   Final    Blood Culture adequate volume  BOTTLES DRAWN AEROBIC AND ANAEROBIC   Culture   Final    NO GROWTH 4 DAYS Performed at [facility], [address], [place], [place] [address]    Report Status PENDING  Incomplete  Blood Culture (routine x 2)     Status: None (Preliminary result)   Collection Time: 10/10/19  5:55 PM   Specimen: BLOOD RIGHT WRIST  Result Value Ref Range Status   Specimen Description BLOOD RIGHT WRIST  Final   Special Requests   Final    BOTTLES DRAWN AEROBIC AND ANAEROBIC  Blood Culture adequate volume   Culture   Final    NO GROWTH 4 DAYS Performed at [facility], [address] [place],  [address]    Report Status PENDING  Incomplete  SARS Coronavirus 2 by RT PCR (hospital order, performed in [place] hospital lab) Nasopharyngeal Nasopharyngeal Swab     Status: Abnormal   Collection Time: 10/10/19  7:36 PM   Specimen: Nasopharyngeal Swab  Result Value Ref Range Status   SARS Coronavirus 2 POSITIVE (A) NEGATIVE Final    Comment: RESULT CALLED TO, READ BACK BY AND VERIFIED WITH: NICKELS,K AT 2226 ON 10.27.20 BY ISLEY,B (NOTE) If result is NEGATIVE SARS-CoV-2 target nucleic acids are NOT DETECTED. The SARS-CoV-2 RNA is generally detectable in upper and lower  respiratory specimens during the acute phase of infection. The lowest  concentration of SARS-CoV-2 viral copies this assay can detect is 250  copies / mL. A negative result does not preclude SARS-CoV-2 infection  and should not be used as the sole basis for treatment or other  patient management decisions.  A negative result may occur with  improper specimen collection / handling, submission of specimen other  than nasopharyngeal swab, presence of viral mutation(s) within the  areas targeted by this assay, and inadequate number of viral copies  (<250 copies / mL). A negative result must be combined with clinical  observations, patient history, and epidemiological information. If result is POSITIVE SARS-CoV-2 target nucleic acids are DETECTE D. The SARS-CoV-2 RNA is generally detectable in upper and lower  respiratory specimens during the acute phase of infection.  Positive  results are indicative of active infection with SARS-CoV-2.  Clinical  correlation with patient history and other diagnostic information is  necessary to determine patient infection status.  Positive results do  not rule out bacterial infection or co-infection with other viruses. If result is PRESUMPTIVE  POSTIVE SARS-CoV-2 nucleic acids MAY BE PRESENT.   A presumptive positive result was obtained on the submitted specimen  and confirmed on repeat testing.  While 2019 novel coronavirus  (SARS-CoV-2) nucleic acids may be present in the submitted sample  additional confirmatory testing may be necessary for epidemiological  and / or clinical management purposes  to differentiate between  SARS-CoV-2 and other Sarbecovirus currently known to infect humans.  If clinically indicated additional testing with an alternate test  methodology (LAB745 3) is advised. The SARS-CoV-2 RNA is generally  detectable in upper and lower respiratory specimens during the acute  phase of infection. The expected result is Negative. Fact Sheet for Patients:  StrictlyIdeas.no Fact Sheet for Healthcare Providers: BankingDealers.co.za This test is not yet approved or cleared by the [place] FDA and has been authorized for detection and/or diagnosis of SARS-CoV-2 by FDA under an Emergency Use Authorization (EUA).  This EUA will remain in effect (meaning this test can be used) for the duration of the COVID-19 declaration under Section 564(b)(1) of the Act, 21  U.S.C. section 360bbb-3(b)(1), unless the authorization is terminated or revoked sooner. Performed at [facility], [address] [place], [place] [address]     Radiology Reports Dg Chest [place] 1 View  Result Date: 10/10/2019 CLINICAL DATA:  Fever, cough, shortness of breath for 1 week EXAM: PORTABLE CHEST 1 VIEW COMPARISON:  April 05, 2014 FINDINGS: The aorta is tortuous. The heart size is normal. Patchy consolidation of right lung base is noted. There is no pulmonary edema or pleural effusion. The bony structures are stable. IMPRESSION: Right lung base pneumonia. Electronically Signed   By: Abelardo Diesel M.D.   On: 10/10/2019 18:21

## 2019-10-16 DIAGNOSIS — U071 COVID-19: Principal | ICD-10-CM

## 2019-10-16 LAB — COMPREHENSIVE METABOLIC PANEL
ALT: 36 U/L (ref 0–44)
AST: 26 U/L (ref 15–41)
Albumin: 3 g/dL — ABNORMAL LOW (ref 3.5–5.0)
Alkaline Phosphatase: 47 U/L (ref 38–126)
Anion gap: 13 (ref 5–15)
BUN: 24 mg/dL — ABNORMAL HIGH (ref 8–23)
CO2: 24 mmol/L (ref 22–32)
Calcium: 8.8 mg/dL — ABNORMAL LOW (ref 8.9–10.3)
Chloride: 94 mmol/L — ABNORMAL LOW (ref 98–111)
Creatinine, Ser: 0.64 mg/dL (ref 0.44–1.00)
GFR calc Af Amer: >60 mL/min (ref >60)
GFR calc non Af Amer: >60 mL/min (ref >60)
Glucose, Bld: 277 mg/dL — ABNORMAL HIGH (ref 70–99)
Potassium: 3.9 mmol/L (ref 3.5–5.1)
Sodium: 131 mmol/L — ABNORMAL LOW (ref 135–145)
Total Bilirubin: 0.6 mg/dL (ref 0.3–1.2)
Total Protein: 6.1 g/dL — ABNORMAL LOW (ref 6.5–8.1)

## 2019-10-16 LAB — C-REACTIVE PROTEIN: CRP: 1.1 mg/dL — ABNORMAL HIGH (ref <1.0)

## 2019-10-16 LAB — FERRITIN: Ferritin: 184 ng/mL (ref 11–307)

## 2019-10-16 MED ORDER — BENZONATATE 200 MG PO CAPS: 200.0000 mg | ORAL_CAPSULE | Freq: Three times a day (TID) | ORAL | 0 refills | Status: DC | PRN

## 2019-10-16 MED ORDER — DEXAMETHASONE 6 MG PO TABS: 6.0000 mg | ORAL_TABLET | Freq: Every day | ORAL | 0 refills | Status: DC

## 2019-10-16 MED ORDER — FUROSEMIDE 10 MG/ML IJ SOLN
40.0000 mg | Freq: Once | INTRAMUSCULAR | Status: AC
  Administered 2019-10-16: 09:00:00 40 mg via INTRAVENOUS
  Filled 2019-10-16: qty 4

## 2019-10-16 NOTE — Progress Notes (Signed)
Physical Therapy Treatment Patient Details Name: Kelsey Ruiz MRN: [id number] DOB: 06/13/1929 Today's Date: 10/16/2019    History of Present Illness 85 y.o. female with PMHx of HTN, hypothyroidism, GERD-presenting to the ED with 1 week history of cough, shortness of breath-she was found to have acute hypoxic respiratory failure in the setting of COVID-19 pneumonia    PT Comments     The patient is excited to be going home. Patient reports having O2 at home. Patient ambulated a short distance on RA-60' with SPO2 down to 87%. Recommend HHPT.   Follow Up Recommendations  Home health PT     Equipment Recommendations  None recommended by PT    Recommendations for Other Services       Precautions / Restrictions Precautions Precaution Comments: monitor O2    Mobility  Bed Mobility               General bed mobility comments: OOB  Transfers     Transfers: Sit to/from Stand Sit to Stand: Supervision         General transfer comment: no assistance  Ambulation/Gait Ambulation/Gait assistance: Min guard Gait Distance (Feet): 60 Feet Assistive device: Rolling walker (2 wheeled) Gait Pattern/deviations: Step-through pattern     General Gait Details: slow, encouraged PLB and not talk. On RA , Limited distance as patient is to Dc home today   Stairs             Wheelchair Mobility    Modified Rankin (Stroke Patients Only)       Balance     Sitting balance-Leahy Scale: Good       Standing balance-Leahy Scale: Fair                              Cognition Arousal/Alertness: Awake/alert                                            Exercises      General Comments        Pertinent Vitals/Pain Pain Assessment: No/denies pain    Home Living                      Prior Function            PT Goals (current goals can now be found in the care plan section)      Frequency    Min 3X/week      PT Plan Current plan remains appropriate    Co-evaluation              AM-PAC PT "6 Clicks" Mobility   Outcome Measure  Help needed turning from your back to your side while in a flat bed without using bedrails?: None Help needed moving from lying on your back to sitting on the side of a flat bed without using bedrails?: None Help needed moving to and from a bed to a chair (including a wheelchair)?: A Little Help needed standing up from a chair using your arms (e.g., wheelchair or bedside chair)?: A Little Help needed to walk in hospital room?: A Little Help needed climbing 3-5 steps with a railing? : A Lot 6 Click Score: 19    End of Session   Activity Tolerance: Treatment limited secondary to medical complications (Comment);Patient limited by fatigue Patient left:  in chair;with call bell/phone within reach Nurse Communication: Mobility status PT Visit Diagnosis: Other abnormalities of gait and mobility (R26.89);Muscle weakness (generalized) (M62.81)     Time: [id number] PT Time Calculation (min) (ACUTE ONLY): 16 min  Charges:  $Gait Training: 8-22 mins                     [place] Pager [phone] Office [phone]    Claretha Cooper 10/16/2019, 5:01 PM

## 2019-10-16 NOTE — Care Management Important Message (Signed)
Important Message  Patient Details  Name: Kelsey Ruiz MRN: [id number] Date of Birth: 1929/09/13   Medicare Important Message Given:  Yes - Important Message mailed due to current National Emergency  Verbal consent obtained due to current National Emergency  Relationship to patient: Child Contact Name: Valerie Roys Call Date: 10/16/19  Time: 1208 Phone: [phone] Outcome: Spoke with contact Important Message mailed to: Patient address on file    Delorse Lek 10/16/2019, 12:08 PM

## 2019-10-16 NOTE — Discharge Summary (Signed)
Physician Discharge Summary  Kelsey Ruiz [id number] DOB: Aug 23, 1929 DOA: 10/10/2019  PCP: Sharilyn Sites, MD  Admit date: 10/10/2019 Discharge date: 10/16/2019  Admitted From: home Disposition:  home  Recommendations for Outpatient Follow-up:  1. Follow up with PCP in 1-2 weeks  Home Health: PT Equipment/Devices: walker, home O2  Discharge Condition: stable CODE STATUS: Full code Diet recommendation: regular  HPI: Per admitting MD, Kelsey Ruiz is a 85 y.o. female with medical history significant for hypothyroidism, hypertension, and GERD, now presenting to the emergency department with 1 week of cough, shortness of breath, and fever.  Patient had been started on prednisone and doxycycline early in the course of this illness, felt as though she was getting better, but then began to worsen again over the past 2 days. She has also had generalized weakness and fatigue for the past couple days.  She has had some loose stools intermittently without any abdominal pain or vomiting.  There is report that the patient's neighbor had been diagnosed with Covid, but the patient denies any exposure to this person. ED Course: Upon arrival to the ED, patient is found to be febrile to 39.6 C, saturating mid 80s on room air, tachypneic, and with stable blood pressure.  Chest x-ray is concerning for right basilar pneumonia.  Chemistry panel notable for mild hyponatremia and hypokalemia.  CBC with leukocytosis to 13,800.  Lactic acid reassuringly normal.  D-dimer elevated to 1.49.  COVID-19 testing in process.  Patient was treated with acetaminophen, Rocephin, and azithromycin in the ED.  Hospital Course: Acute hypoxic respiratory failure due to COVID-19 viral pneumonia-patient was admitted to the hospital with shortness of breath, hypoxia as well as evidence of COVID-19 viral pneumonia.  She was placed on remdesivir and completed 5 days while hospitalized.  She was also placed on steroids.  With  treatment she improved clinically, feeling back to baseline.  She is able to be comfortable on room air at rest however does require 2 L with ambulation.  On the day of discharge, she is able to ambulate several 100 feet feeling at baseline.  She will be discharged home in stable condition with 5 additional days of steroids  COVID-19 Labs  Recent Labs    10/14/19 0056 10/15/19 0000 10/16/19 0245  DDIMER 0.44 0.57*  --   FERRITIN 209 190 184  CRP 3.7* 2.0* 1.1*    Lab Results  Component Value Date   SARSCOV2NAA POSITIVE (A) 10/10/2019   SARSCOV2NAA POSITIVE (A) 10/10/2019    Hypothyroidism: Continue Synthroid Hypokalemia/hypomagnesemia: Repleted HTN: resume home medications  Discharge Diagnoses:  Principal Problem:   Pneumonia Active Problems:   Acute respiratory failure with hypoxia ([place])   Hypothyroidism   Hypertension   COVID-19 virus infection   Discharge Instructions   Allergies as of 10/16/2019      Reactions   Sulfa Antibiotics Nausea And Vomiting      Medication List    STOP taking these medications   doxycycline 100 MG tablet Commonly known as: VIBRA-TABS     TAKE these medications   Acidophilus 100 MG Caps Take 1 capsule by mouth daily.   alendronate 70 MG tablet Commonly known as: FOSAMAX Take 70 mg by mouth once a week. Take with a full glass of water on an empty stomach. Take on Saturday.   ALPRAZolam 1 MG tablet Commonly known as: XANAX Take 1 mg by mouth 3 (three) times daily as needed for sleep.   aspirin EC 81 MG tablet Take  81 mg by mouth daily.   atorvastatin 80 MG tablet Commonly known as: LIPITOR Take 80 mg by mouth daily.   benzonatate 200 MG capsule Commonly known as: TESSALON Take 1 capsule (200 mg total) by mouth 3 (three) times daily as needed for cough.   CAL-MAG-ZINC PO Take 1 tablet by mouth every morning.   cholecalciferol 1000 units tablet Commonly known as: VITAMIN D Take 1,000 Units by mouth every morning.    Coenzyme Q10 200 MG capsule Take 200 mg by mouth every morning.   dexamethasone 4 MG tablet Commonly known as: DECADRON Take 1 tablet (4 mg total) by mouth 2 (two) times daily with a meal. What changed: Another medication with the same name was added. Make sure you understand how and when to take each.   dexamethasone 6 MG tablet Commonly known as: DECADRON Take 1 tablet (6 mg total) by mouth daily. What changed: You were already taking a medication with the same name, and this prescription was added. Make sure you understand how and when to take each.   diltiazem 180 MG 24 hr capsule Commonly known as: CARDIZEM CD Take 180 mg by mouth 2 (two) times daily.   donepezil 10 MG tablet Commonly known as: ARICEPT Take 10 mg by mouth at bedtime.   furosemide 40 MG tablet Commonly known as: LASIX Take 40 mg by mouth daily as needed.   gabapentin 100 MG capsule Commonly known as: NEURONTIN Take 100 mg by mouth 3 (three) times daily.   HYDROcodone-homatropine 5-1.5 MG/5ML syrup Commonly known as: HYCODAN Take 5 mLs by mouth every 6 (six) hours as needed.   levothyroxine 75 MCG tablet Commonly known as: SYNTHROID Take 75 mcg by mouth daily before breakfast.   meloxicam 15 MG tablet Commonly known as: MOBIC Take 15 mg by mouth daily.   nitrofurantoin 50 MG capsule Commonly known as: MACRODANTIN Take 50 mg by mouth daily.   omeprazole 40 MG capsule Commonly known as: PRILOSEC Take 40 mg by mouth every morning.   ondansetron 4 MG disintegrating tablet Commonly known as: Zofran ODT Take 1 tablet (4 mg total) by mouth every 8 (eight) hours as needed for nausea or vomiting.   traMADol 50 MG tablet Commonly known as: ULTRAM Take 50 mg by mouth every 6 (six) hours as needed for moderate pain.   Vitamin B12 1000 MCG Tbcr Take 1,000 mcg by mouth every morning.   vitamin C 500 MG tablet Commonly known as: ASCORBIC ACID Take 1,000 mg by mouth every morning.             Durable Medical Equipment  (From admission, onward)         Start     Ordered   10/14/19 1323  For home use only DME oxygen  Once    Question Answer Comment  Length of Need 6 Months   Mode or (Route) Nasal cannula   Liters per Minute 2   Oxygen delivery system Gas      10/14/19 1322         Follow-up Information    [facility] Follow up.   Specialty: [place] Why: A representative from [facility] will contact you to arrange start date and time for your therapy. Contact information: [address] [place] [address] [phone]        [place] Follow up.   Why: A representaitve from [employer] will deliver oxygen concentrator and tanks to your  home. Should you have problems with tanks, call [employer] at the number listed on tank.  Contact information: [place] [address] [phone]           Consultations:  None   Procedures/Studies:  Dg Chest Port 1 View  Result Date: 10/10/2019 CLINICAL DATA:  Fever, cough, shortness of breath for 1 week EXAM: PORTABLE CHEST 1 VIEW COMPARISON:  April 05, 2014 FINDINGS: The aorta is tortuous. The heart size is normal. Patchy consolidation of right lung base is noted. There is no pulmonary edema or pleural effusion. The bony structures are stable. IMPRESSION: Right lung base pneumonia. Electronically Signed   By: Abelardo Diesel M.D.   On: 10/10/2019 18:21      Subjective: - no chest pain, shortness of breath, no abdominal pain, nausea or vomiting.   Discharge Exam: BP 135/74 (BP Location: Right Arm)   Pulse 100   Temp 98.4 F (36.9 C) (Oral)   Resp 20   Ht 5\' 2"  (1.575 m)   Wt 56.7 kg   SpO2 90%   BMI 22.86 kg/m   General: Pt is alert, awake, not in acute distress Cardiovascular: RRR, S1/S2 +, no rubs, no gallops Respiratory: CTA bilaterally, no wheezing, no rhonchi Abdominal: Soft, NT, ND, bowel sounds + Extremities: no  edema, no cyanosis    The results of significant diagnostics from this hospitalization (including imaging, microbiology, ancillary and laboratory) are listed below for reference.     Microbiology: Recent Results (from the past 240 hour(s))  SARS CORONAVIRUS 2 (TAT 6-24 HRS) Nasopharyngeal Nasopharyngeal Swab     Status: Abnormal   Collection Time: 10/10/19  5:06 PM   Specimen: Nasopharyngeal Swab  Result Value Ref Range Status   SARS Coronavirus 2 POSITIVE (A) NEGATIVE Final    Comment: RESULT CALLED TO, READ BACK BY AND VERIFIED WITH: DOSS M, RN AT 0101 ON 10/11/2019 BY SAINVILUS S (NOTE) SARS-CoV-2 target nucleic acids are DETECTED. The SARS-CoV-2 RNA is generally detectable in upper and lower respiratory specimens during the acute phase of infection. Positive results are indicative of active infection with SARS-CoV-2. Clinical  correlation with patient history and other diagnostic information is necessary to determine patient infection status. Positive results do  not rule out bacterial infection or co-infection with other viruses. The expected result is Negative. Fact Sheet for Patients: SugarRoll.be Fact Sheet for Healthcare Providers: https://www.woods-mathews.com/ This test is not yet approved or cleared by the [place] FDA and  has been authorized for detection and/or diagnosis of SARS-CoV-2 by FDA under an Emergency Use Authorization (EUA). This EUA will remain  in effect (meaning this test can be  used) for the duration of the COVID-19 declaration under Section 564(b)(1) of the Act, 21 U.S.C. section 360bbb-3(b)(1), unless the authorization is terminated or revoked sooner. Performed at [facility], [place] [address] [place], [place] [address]   Blood Culture (routine x 2)     Status: None   Collection Time: 10/10/19  5:41 PM   Specimen: Right Antecubital; Blood  Result Value Ref Range Status   Specimen Description  RIGHT ANTECUBITAL  Final   Special Requests   Final    Blood Culture adequate volume BOTTLES DRAWN AEROBIC AND ANAEROBIC   Culture   Final    NO GROWTH 5 DAYS Performed at [facility], [address], [place], [place] [address]    Report Status 10/15/2019 FINAL  Final  Blood Culture (routine x 2)     Status: None  Collection Time: 10/10/19  5:55 PM   Specimen: BLOOD RIGHT WRIST  Result Value Ref Range Status   Specimen Description BLOOD RIGHT WRIST  Final   Special Requests   Final    BOTTLES DRAWN AEROBIC AND ANAEROBIC Blood Culture adequate volume   Culture   Final    NO GROWTH 5 DAYS Performed at [facility], [address], [place], [place] [address]    Report Status 10/15/2019 FINAL  Final  SARS Coronavirus 2 by RT PCR (hospital order, performed in [place] hospital lab) Nasopharyngeal Nasopharyngeal Swab     Status: Abnormal   Collection Time: 10/10/19  7:36 PM   Specimen: Nasopharyngeal Swab  Result Value Ref Range Status   SARS Coronavirus 2 POSITIVE (A) NEGATIVE Final    Comment: RESULT CALLED TO, READ BACK BY AND VERIFIED WITH: NICKELS,K AT 2226 ON 10.27.20 BY ISLEY,B (NOTE) If result is NEGATIVE SARS-CoV-2 target nucleic acids are NOT DETECTED. The SARS-CoV-2 RNA is generally detectable in upper and lower  respiratory specimens during the acute phase of infection. The lowest  concentration of SARS-CoV-2 viral copies this assay can detect is 250  copies / mL. A negative result does not preclude SARS-CoV-2 infection  and should not be used as the sole basis for treatment or other  patient management decisions.  A negative result may occur with  improper specimen collection / handling, submission of specimen other  than nasopharyngeal swab, presence of viral mutation(s) within the  areas targeted by this assay, and inadequate number of viral copies  (<250 copies / mL). A negative result must be combined with clinical  observations, patient history, and  epidemiological information. If result is POSITIVE SARS-CoV-2 target nucleic acids are DETECTE D. The SARS-CoV-2 RNA is generally detectable in upper and lower  respiratory specimens during the acute phase of infection.  Positive  results are indicative of active infection with SARS-CoV-2.  Clinical  correlation with patient history and other diagnostic information is  necessary to determine patient infection status.  Positive results do  not rule out bacterial infection or co-infection with other viruses. If result is PRESUMPTIVE POSTIVE SARS-CoV-2 nucleic acids MAY BE PRESENT.   A presumptive positive result was obtained on the submitted specimen  and confirmed on repeat testing.  While 2019 novel coronavirus  (SARS-CoV-2) nucleic acids may be present in the submitted sample  additional confirmatory testing may be necessary for epidemiological  and / or clinical management purposes  to differentiate between  SARS-CoV-2 and other Sarbecovirus currently known to infect humans.  If clinically indicated additional testing with an alternate test  methodology (LAB745 3) is advised. The SARS-CoV-2 RNA is generally  detectable in upper and lower respiratory specimens during the acute  phase of infection. The expected result is Negative. Fact Sheet for Patients:  StrictlyIdeas.no Fact Sheet for Healthcare Providers: BankingDealers.co.za This test is not yet approved or cleared by the [place] FDA and has been authorized for detection and/or diagnosis of SARS-CoV-2 by FDA under an Emergency Use Authorization (EUA).  This EUA will remain in effect (meaning this test can be used) for the duration of the COVID-19 declaration under Section 564(b)(1) of the Act, 21 U.S.C. section 360bbb-3(b)(1), unless the authorization is terminated or revoked sooner. Performed at [facility], [address], [place], [place] [address]      Labs: BNP (last  3 results) No results for input(s): BNP in the last 8760 hours. Basic Metabolic Panel: Recent Labs  Lab 10/11/19 0520 10/12/19  0155 10/13/19 0130 10/14/19 0056 10/15/19 0000 10/16/19 0245  NA 136 130* 132* 133* 133* 131*  K 3.9 4.1 4.2 4.0 4.4 3.9  CL 102 98 99 99 98 94*  CO2 22 22 23 24 22 24   GLUCOSE 97 170* 196* 217* 224* 277*  BUN 13 23 25* 22 20 24*  CREATININE 0.56 0.64 0.66 0.67 0.53 0.64  CALCIUM 8.4* 8.5* 8.7* 8.8* 8.8* 8.8*  MG 1.9 2.4 2.1 2.1 2.2  --    Liver Function Tests: Recent Labs  Lab 10/12/19 0155 10/13/19 0130 10/14/19 0056 10/15/19 0000 10/16/19 0245  AST 22 26 28 28 26   ALT 18 21 27 30  36  ALKPHOS 52 50 50 47 47  BILITOT 0.6 0.7 0.5 0.3 0.6  PROT 6.5 6.1* 5.9* 5.9* 6.1*  ALBUMIN 2.8* 2.7* 2.8* 2.8* 3.0*   No results for input(s): LIPASE, AMYLASE in the last 168 hours. No results for input(s): AMMONIA in the last 168 hours. CBC: Recent Labs  Lab 10/11/19 0520 10/12/19 0155 10/13/19 0130 10/14/19 0056 10/15/19 0000  WBC 12.6* 8.0 10.9* 10.9* 11.2*  NEUTROABS 10.4* 6.6 8.7* 8.6* 8.4*  HGB 11.5* 11.8* 11.2* 11.1* 11.2*  HCT 35.6* 35.8* 33.1* 33.5* 34.2*  MCV 91.0 88.6 87.3 87.5 89.3  PLT 285 304 354 333 312   Cardiac Enzymes: No results for input(s): CKTOTAL, CKMB, CKMBINDEX, TROPONINI in the last 168 hours. BNP: Invalid input(s): POCBNP CBG: No results for input(s): GLUCAP in the last 168 hours. D-Dimer Recent Labs    10/14/19 0056 10/15/19 0000  DDIMER 0.44 0.57*   Hgb A1c No results for input(s): HGBA1C in the last 72 hours. Lipid Profile No results for input(s): CHOL, HDL, LDLCALC, TRIG, CHOLHDL, LDLDIRECT in the last 72 hours. Thyroid function studies No results for input(s): TSH, T4TOTAL, T3FREE, THYROIDAB in the last 72 hours.  Invalid input(s): FREET3 Anemia work up Recent Labs    10/15/19 0000 10/16/19 0245  FERRITIN 190 184   Urinalysis    Component Value Date/Time   COLORURINE AMBER (A) 07/07/2014 1129    APPEARANCEUR CLOUDY (A) 07/07/2014 1129   LABSPEC 1.010 07/07/2014 1129   PHURINE 7.5 07/07/2014 1129   GLUCOSEU NEGATIVE 07/07/2014 1129   HGBUR LARGE (A) 07/07/2014 1129   BILIRUBINUR NEGATIVE 07/07/2014 1129   KETONESUR NEGATIVE 07/07/2014 1129   PROTEINUR 100 (A) 07/07/2014 1129   UROBILINOGEN 0.2 07/07/2014 1129   NITRITE NEGATIVE 07/07/2014 1129   LEUKOCYTESUR LARGE (A) 07/07/2014 1129   Sepsis Labs Invalid input(s): PROCALCITONIN,  WBC,  LACTICIDVEN  FURTHER DISCHARGE INSTRUCTIONS:   Get Medicines reviewed and adjusted: Please take all your medications with you for your next visit with your Primary MD   Laboratory/radiological data: Please request your Primary MD to go over all hospital tests and procedure/radiological results at the follow up, please ask your Primary MD to get all Hospital records sent to his/her office.   In some cases, they will be blood work, cultures and biopsy results pending at the time of your discharge. Please request that your primary care M.D. goes through all the records of your hospital data and follows up on these results.   Also Note the following: If you experience worsening of your admission symptoms, develop shortness of breath, life threatening emergency, suicidal or homicidal thoughts you must seek medical attention immediately by calling 911 or calling your MD immediately  if symptoms less severe.   You must read complete instructions/literature along with all the possible adverse reactions/side effects for  all the Medicines you take and that have been prescribed to you. Take any new Medicines after you have completely understood and accpet all the possible adverse reactions/side effects.    Do not drive when taking Pain medications or sleeping medications (Benzodaizepines)   Do not take more than prescribed Pain, Sleep and Anxiety Medications. It is not advisable to combine anxiety,sleep and pain medications without talking with your  primary care practitioner   Special Instructions: If you have smoked or chewed Tobacco  in the last 2 yrs please stop smoking, stop any regular Alcohol  and or any Recreational drug use.   Wear Seat belts while driving.   Please note: You were cared for by a hospitalist during your hospital stay. Once you are discharged, your primary care physician will handle any further medical issues. Please note that NO REFILLS for any discharge medications will be authorized once you are discharged, as it is imperative that you return to your primary care physician (or establish a relationship with a primary care physician if you do not have one) for your post hospital discharge needs so that they can reassess your need for medications and monitor your lab values.  Time coordinating discharge: 35 minutes  SIGNED:  Marzetta Board, MD, PhD 10/16/2019, 3:54 PM

## 2019-10-16 NOTE — Plan of Care (Signed)
Patient updated on plan of care, patient said she already updated her family

## 2019-10-17 DIAGNOSIS — U071 COVID-19: Secondary | ICD-10-CM | POA: Diagnosis not present

## 2019-10-23 ENCOUNTER — Other Ambulatory Visit: Payer: Self-pay

## 2019-10-23 DIAGNOSIS — J1289 Other viral pneumonia: Secondary | ICD-10-CM | POA: Diagnosis not present

## 2019-10-23 DIAGNOSIS — U071 COVID-19: Secondary | ICD-10-CM | POA: Diagnosis not present

## 2019-10-23 DIAGNOSIS — I119 Hypertensive heart disease without heart failure: Secondary | ICD-10-CM | POA: Diagnosis not present

## 2019-10-23 DIAGNOSIS — J9601 Acute respiratory failure with hypoxia: Secondary | ICD-10-CM | POA: Diagnosis not present

## 2019-10-23 DIAGNOSIS — E876 Hypokalemia: Secondary | ICD-10-CM | POA: Diagnosis not present

## 2019-10-23 NOTE — Patient Outreach (Signed)
[place] [facility]) Care Management  10/23/2019  Kelsey Ruiz [birth year] [id number]   Medication Adherence call to Mrs. Kelsey Ruiz [facility] Identifiers Verify spoke with patient she is past due on Atorvastatin 80 mg ,patient explain she only takes 1/2 tablet daily not a full tablet per doctor instruction,patient has plenty at this time and will ask doctor to write a prescription for the correct amount on her next appointment. Kelsey Ruiz is showing past due under [facility].   [facility] Direct Dial [phone]  Fax [phone] [email or web address]

## 2019-10-31 DIAGNOSIS — J1289 Other viral pneumonia: Secondary | ICD-10-CM | POA: Diagnosis not present

## 2019-10-31 DIAGNOSIS — U071 COVID-19: Secondary | ICD-10-CM | POA: Diagnosis not present

## 2019-10-31 DIAGNOSIS — I119 Hypertensive heart disease without heart failure: Secondary | ICD-10-CM | POA: Diagnosis not present

## 2019-10-31 DIAGNOSIS — E876 Hypokalemia: Secondary | ICD-10-CM | POA: Diagnosis not present

## 2019-10-31 DIAGNOSIS — J9601 Acute respiratory failure with hypoxia: Secondary | ICD-10-CM | POA: Diagnosis not present

## 2019-11-01 DIAGNOSIS — E876 Hypokalemia: Secondary | ICD-10-CM | POA: Diagnosis not present

## 2019-11-01 DIAGNOSIS — U071 COVID-19: Secondary | ICD-10-CM | POA: Diagnosis not present

## 2019-11-01 DIAGNOSIS — I119 Hypertensive heart disease without heart failure: Secondary | ICD-10-CM | POA: Diagnosis not present

## 2019-11-01 DIAGNOSIS — J1289 Other viral pneumonia: Secondary | ICD-10-CM | POA: Diagnosis not present

## 2019-11-01 DIAGNOSIS — J9601 Acute respiratory failure with hypoxia: Secondary | ICD-10-CM | POA: Diagnosis not present

## 2019-11-03 DIAGNOSIS — U071 COVID-19: Secondary | ICD-10-CM | POA: Diagnosis not present

## 2019-11-03 DIAGNOSIS — I119 Hypertensive heart disease without heart failure: Secondary | ICD-10-CM | POA: Diagnosis not present

## 2019-11-03 DIAGNOSIS — E876 Hypokalemia: Secondary | ICD-10-CM | POA: Diagnosis not present

## 2019-11-03 DIAGNOSIS — J9601 Acute respiratory failure with hypoxia: Secondary | ICD-10-CM | POA: Diagnosis not present

## 2019-11-03 DIAGNOSIS — J1289 Other viral pneumonia: Secondary | ICD-10-CM | POA: Diagnosis not present

## 2019-11-08 DIAGNOSIS — U071 COVID-19: Secondary | ICD-10-CM | POA: Diagnosis not present

## 2019-11-08 DIAGNOSIS — J9601 Acute respiratory failure with hypoxia: Secondary | ICD-10-CM | POA: Diagnosis not present

## 2019-11-08 DIAGNOSIS — E876 Hypokalemia: Secondary | ICD-10-CM | POA: Diagnosis not present

## 2019-11-08 DIAGNOSIS — I119 Hypertensive heart disease without heart failure: Secondary | ICD-10-CM | POA: Diagnosis not present

## 2019-11-08 DIAGNOSIS — J1289 Other viral pneumonia: Secondary | ICD-10-CM | POA: Diagnosis not present

## 2019-11-14 DIAGNOSIS — U071 COVID-19: Secondary | ICD-10-CM | POA: Diagnosis not present

## 2019-11-14 DIAGNOSIS — I119 Hypertensive heart disease without heart failure: Secondary | ICD-10-CM | POA: Diagnosis not present

## 2019-11-14 DIAGNOSIS — J1289 Other viral pneumonia: Secondary | ICD-10-CM | POA: Diagnosis not present

## 2019-11-14 DIAGNOSIS — J9601 Acute respiratory failure with hypoxia: Secondary | ICD-10-CM | POA: Diagnosis not present

## 2019-11-14 DIAGNOSIS — E876 Hypokalemia: Secondary | ICD-10-CM | POA: Diagnosis not present

## 2019-11-16 DIAGNOSIS — U071 COVID-19: Secondary | ICD-10-CM | POA: Diagnosis not present

## 2019-11-20 DIAGNOSIS — M1991 Primary osteoarthritis, unspecified site: Secondary | ICD-10-CM | POA: Diagnosis not present

## 2019-11-20 DIAGNOSIS — G894 Chronic pain syndrome: Secondary | ICD-10-CM | POA: Diagnosis not present

## 2019-11-20 DIAGNOSIS — E039 Hypothyroidism, unspecified: Secondary | ICD-10-CM | POA: Diagnosis not present

## 2019-11-20 DIAGNOSIS — R002 Palpitations: Secondary | ICD-10-CM | POA: Diagnosis not present

## 2019-11-21 DIAGNOSIS — E876 Hypokalemia: Secondary | ICD-10-CM | POA: Diagnosis not present

## 2019-11-21 DIAGNOSIS — I119 Hypertensive heart disease without heart failure: Secondary | ICD-10-CM | POA: Diagnosis not present

## 2019-11-21 DIAGNOSIS — J1289 Other viral pneumonia: Secondary | ICD-10-CM | POA: Diagnosis not present

## 2019-11-21 DIAGNOSIS — U071 COVID-19: Secondary | ICD-10-CM | POA: Diagnosis not present

## 2019-11-21 DIAGNOSIS — J9601 Acute respiratory failure with hypoxia: Secondary | ICD-10-CM | POA: Diagnosis not present

## 2019-12-14 DIAGNOSIS — I10 Essential (primary) hypertension: Secondary | ICD-10-CM | POA: Diagnosis not present

## 2019-12-14 DIAGNOSIS — G894 Chronic pain syndrome: Secondary | ICD-10-CM | POA: Diagnosis not present

## 2019-12-14 DIAGNOSIS — M1991 Primary osteoarthritis, unspecified site: Secondary | ICD-10-CM | POA: Diagnosis not present

## 2019-12-26 DIAGNOSIS — Z1389 Encounter for screening for other disorder: Secondary | ICD-10-CM | POA: Diagnosis not present

## 2019-12-26 DIAGNOSIS — Z0000 Encounter for general adult medical examination without abnormal findings: Secondary | ICD-10-CM | POA: Diagnosis not present

## 2019-12-26 DIAGNOSIS — I10 Essential (primary) hypertension: Secondary | ICD-10-CM | POA: Diagnosis not present

## 2019-12-26 DIAGNOSIS — E559 Vitamin D deficiency, unspecified: Secondary | ICD-10-CM | POA: Diagnosis not present

## 2019-12-26 DIAGNOSIS — E039 Hypothyroidism, unspecified: Secondary | ICD-10-CM | POA: Diagnosis not present

## 2019-12-26 DIAGNOSIS — E7849 Other hyperlipidemia: Secondary | ICD-10-CM | POA: Diagnosis not present

## 2019-12-26 DIAGNOSIS — U071 COVID-19: Secondary | ICD-10-CM | POA: Diagnosis not present

## 2020-01-14 DIAGNOSIS — M1991 Primary osteoarthritis, unspecified site: Secondary | ICD-10-CM | POA: Diagnosis not present

## 2020-01-14 DIAGNOSIS — G894 Chronic pain syndrome: Secondary | ICD-10-CM | POA: Diagnosis not present

## 2020-01-14 DIAGNOSIS — I10 Essential (primary) hypertension: Secondary | ICD-10-CM | POA: Diagnosis not present

## 2020-01-14 DIAGNOSIS — E039 Hypothyroidism, unspecified: Secondary | ICD-10-CM | POA: Diagnosis not present

## 2020-01-26 DIAGNOSIS — G894 Chronic pain syndrome: Secondary | ICD-10-CM | POA: Diagnosis not present

## 2020-02-11 DIAGNOSIS — E039 Hypothyroidism, unspecified: Secondary | ICD-10-CM | POA: Diagnosis not present

## 2020-02-11 DIAGNOSIS — I10 Essential (primary) hypertension: Secondary | ICD-10-CM | POA: Diagnosis not present

## 2020-02-11 DIAGNOSIS — G894 Chronic pain syndrome: Secondary | ICD-10-CM | POA: Diagnosis not present

## 2020-02-11 DIAGNOSIS — M1991 Primary osteoarthritis, unspecified site: Secondary | ICD-10-CM | POA: Diagnosis not present

## 2020-02-26 DIAGNOSIS — G894 Chronic pain syndrome: Secondary | ICD-10-CM | POA: Diagnosis not present

## 2020-03-04 DIAGNOSIS — L820 Inflamed seborrheic keratosis: Secondary | ICD-10-CM | POA: Diagnosis not present

## 2020-03-04 DIAGNOSIS — B078 Other viral warts: Secondary | ICD-10-CM | POA: Diagnosis not present

## 2020-03-04 DIAGNOSIS — L570 Actinic keratosis: Secondary | ICD-10-CM | POA: Diagnosis not present

## 2020-03-04 DIAGNOSIS — X32XXXD Exposure to sunlight, subsequent encounter: Secondary | ICD-10-CM | POA: Diagnosis not present

## 2020-03-04 DIAGNOSIS — L720 Epidermal cyst: Secondary | ICD-10-CM | POA: Diagnosis not present

## 2020-03-13 DIAGNOSIS — M1991 Primary osteoarthritis, unspecified site: Secondary | ICD-10-CM | POA: Diagnosis not present

## 2020-03-13 DIAGNOSIS — E039 Hypothyroidism, unspecified: Secondary | ICD-10-CM | POA: Diagnosis not present

## 2020-03-13 DIAGNOSIS — I10 Essential (primary) hypertension: Secondary | ICD-10-CM | POA: Diagnosis not present

## 2020-03-25 DIAGNOSIS — B078 Other viral warts: Secondary | ICD-10-CM | POA: Diagnosis not present

## 2020-03-25 DIAGNOSIS — L720 Epidermal cyst: Secondary | ICD-10-CM | POA: Diagnosis not present

## 2020-03-28 DIAGNOSIS — G894 Chronic pain syndrome: Secondary | ICD-10-CM | POA: Diagnosis not present

## 2020-04-12 DIAGNOSIS — M1991 Primary osteoarthritis, unspecified site: Secondary | ICD-10-CM | POA: Diagnosis not present

## 2020-04-12 DIAGNOSIS — G894 Chronic pain syndrome: Secondary | ICD-10-CM | POA: Diagnosis not present

## 2020-04-12 DIAGNOSIS — E039 Hypothyroidism, unspecified: Secondary | ICD-10-CM | POA: Diagnosis not present

## 2020-04-12 DIAGNOSIS — I10 Essential (primary) hypertension: Secondary | ICD-10-CM | POA: Diagnosis not present

## 2020-04-29 DIAGNOSIS — G894 Chronic pain syndrome: Secondary | ICD-10-CM | POA: Diagnosis not present

## 2020-04-29 DIAGNOSIS — M1991 Primary osteoarthritis, unspecified site: Secondary | ICD-10-CM | POA: Diagnosis not present

## 2020-04-29 DIAGNOSIS — R300 Dysuria: Secondary | ICD-10-CM | POA: Diagnosis not present

## 2020-05-13 DIAGNOSIS — E039 Hypothyroidism, unspecified: Secondary | ICD-10-CM | POA: Diagnosis not present

## 2020-05-13 DIAGNOSIS — I10 Essential (primary) hypertension: Secondary | ICD-10-CM | POA: Diagnosis not present

## 2020-05-13 DIAGNOSIS — M1991 Primary osteoarthritis, unspecified site: Secondary | ICD-10-CM | POA: Diagnosis not present

## 2020-05-13 DIAGNOSIS — G894 Chronic pain syndrome: Secondary | ICD-10-CM | POA: Diagnosis not present

## 2020-05-28 DIAGNOSIS — G894 Chronic pain syndrome: Secondary | ICD-10-CM | POA: Diagnosis not present

## 2020-06-12 DIAGNOSIS — E039 Hypothyroidism, unspecified: Secondary | ICD-10-CM | POA: Diagnosis not present

## 2020-06-12 DIAGNOSIS — G894 Chronic pain syndrome: Secondary | ICD-10-CM | POA: Diagnosis not present

## 2020-06-12 DIAGNOSIS — I10 Essential (primary) hypertension: Secondary | ICD-10-CM | POA: Diagnosis not present

## 2020-06-12 DIAGNOSIS — M1991 Primary osteoarthritis, unspecified site: Secondary | ICD-10-CM | POA: Diagnosis not present

## 2020-06-21 DIAGNOSIS — G894 Chronic pain syndrome: Secondary | ICD-10-CM | POA: Diagnosis not present

## 2020-06-21 DIAGNOSIS — S61411A Laceration without foreign body of right hand, initial encounter: Secondary | ICD-10-CM | POA: Diagnosis not present

## 2020-06-24 DIAGNOSIS — H401192 Primary open-angle glaucoma, unspecified eye, moderate stage: Secondary | ICD-10-CM | POA: Diagnosis not present

## 2020-07-02 DIAGNOSIS — M7989 Other specified soft tissue disorders: Secondary | ICD-10-CM | POA: Diagnosis not present

## 2020-07-11 DIAGNOSIS — L720 Epidermal cyst: Secondary | ICD-10-CM | POA: Diagnosis not present

## 2020-07-11 DIAGNOSIS — X32XXXD Exposure to sunlight, subsequent encounter: Secondary | ICD-10-CM | POA: Diagnosis not present

## 2020-07-11 DIAGNOSIS — L570 Actinic keratosis: Secondary | ICD-10-CM | POA: Diagnosis not present

## 2020-07-12 DIAGNOSIS — E039 Hypothyroidism, unspecified: Secondary | ICD-10-CM | POA: Diagnosis not present

## 2020-07-12 DIAGNOSIS — I10 Essential (primary) hypertension: Secondary | ICD-10-CM | POA: Diagnosis not present

## 2020-07-12 DIAGNOSIS — M1991 Primary osteoarthritis, unspecified site: Secondary | ICD-10-CM | POA: Diagnosis not present

## 2020-07-12 DIAGNOSIS — G894 Chronic pain syndrome: Secondary | ICD-10-CM | POA: Diagnosis not present

## 2020-07-22 DIAGNOSIS — R7309 Other abnormal glucose: Secondary | ICD-10-CM | POA: Diagnosis not present

## 2020-07-22 DIAGNOSIS — E039 Hypothyroidism, unspecified: Secondary | ICD-10-CM | POA: Diagnosis not present

## 2020-07-22 DIAGNOSIS — I10 Essential (primary) hypertension: Secondary | ICD-10-CM | POA: Diagnosis not present

## 2020-07-22 DIAGNOSIS — E559 Vitamin D deficiency, unspecified: Secondary | ICD-10-CM | POA: Diagnosis not present

## 2020-07-22 DIAGNOSIS — E7849 Other hyperlipidemia: Secondary | ICD-10-CM | POA: Diagnosis not present

## 2020-07-22 DIAGNOSIS — M19011 Primary osteoarthritis, right shoulder: Secondary | ICD-10-CM | POA: Diagnosis not present

## 2020-08-13 DIAGNOSIS — I10 Essential (primary) hypertension: Secondary | ICD-10-CM | POA: Diagnosis not present

## 2020-08-13 DIAGNOSIS — E039 Hypothyroidism, unspecified: Secondary | ICD-10-CM | POA: Diagnosis not present

## 2020-08-13 DIAGNOSIS — M1991 Primary osteoarthritis, unspecified site: Secondary | ICD-10-CM | POA: Diagnosis not present

## 2020-08-13 DIAGNOSIS — G894 Chronic pain syndrome: Secondary | ICD-10-CM | POA: Diagnosis not present

## 2020-08-21 DIAGNOSIS — R82991 Hypocitraturia: Secondary | ICD-10-CM | POA: Diagnosis not present

## 2020-08-21 DIAGNOSIS — G894 Chronic pain syndrome: Secondary | ICD-10-CM | POA: Diagnosis not present

## 2020-09-12 DIAGNOSIS — I10 Essential (primary) hypertension: Secondary | ICD-10-CM | POA: Diagnosis not present

## 2020-09-12 DIAGNOSIS — E039 Hypothyroidism, unspecified: Secondary | ICD-10-CM | POA: Diagnosis not present

## 2020-09-12 DIAGNOSIS — M1991 Primary osteoarthritis, unspecified site: Secondary | ICD-10-CM | POA: Diagnosis not present

## 2020-09-23 DIAGNOSIS — G894 Chronic pain syndrome: Secondary | ICD-10-CM | POA: Diagnosis not present

## 2020-09-23 DIAGNOSIS — E538 Deficiency of other specified B group vitamins: Secondary | ICD-10-CM | POA: Diagnosis not present

## 2020-09-23 DIAGNOSIS — Z23 Encounter for immunization: Secondary | ICD-10-CM | POA: Diagnosis not present

## 2020-09-30 DIAGNOSIS — Z961 Presence of intraocular lens: Secondary | ICD-10-CM | POA: Diagnosis not present

## 2020-09-30 DIAGNOSIS — H401112 Primary open-angle glaucoma, right eye, moderate stage: Secondary | ICD-10-CM | POA: Diagnosis not present

## 2020-10-12 DIAGNOSIS — I10 Essential (primary) hypertension: Secondary | ICD-10-CM | POA: Diagnosis not present

## 2020-10-12 DIAGNOSIS — G894 Chronic pain syndrome: Secondary | ICD-10-CM | POA: Diagnosis not present

## 2020-10-12 DIAGNOSIS — E039 Hypothyroidism, unspecified: Secondary | ICD-10-CM | POA: Diagnosis not present

## 2020-10-22 DIAGNOSIS — G894 Chronic pain syndrome: Secondary | ICD-10-CM | POA: Diagnosis not present

## 2020-10-22 DIAGNOSIS — E538 Deficiency of other specified B group vitamins: Secondary | ICD-10-CM | POA: Diagnosis not present

## 2020-10-22 DIAGNOSIS — K5730 Diverticulosis of large intestine without perforation or abscess without bleeding: Secondary | ICD-10-CM | POA: Diagnosis not present

## 2020-11-12 DIAGNOSIS — G894 Chronic pain syndrome: Secondary | ICD-10-CM | POA: Diagnosis not present

## 2020-11-12 DIAGNOSIS — I10 Essential (primary) hypertension: Secondary | ICD-10-CM | POA: Diagnosis not present

## 2020-11-12 DIAGNOSIS — E039 Hypothyroidism, unspecified: Secondary | ICD-10-CM | POA: Diagnosis not present

## 2020-11-25 DIAGNOSIS — G894 Chronic pain syndrome: Secondary | ICD-10-CM | POA: Diagnosis not present

## 2020-11-25 DIAGNOSIS — E538 Deficiency of other specified B group vitamins: Secondary | ICD-10-CM | POA: Diagnosis not present

## 2020-12-13 DIAGNOSIS — M1991 Primary osteoarthritis, unspecified site: Secondary | ICD-10-CM | POA: Diagnosis not present

## 2020-12-13 DIAGNOSIS — I10 Essential (primary) hypertension: Secondary | ICD-10-CM | POA: Diagnosis not present

## 2020-12-13 DIAGNOSIS — E039 Hypothyroidism, unspecified: Secondary | ICD-10-CM | POA: Diagnosis not present

## 2020-12-26 DIAGNOSIS — Z0001 Encounter for general adult medical examination with abnormal findings: Secondary | ICD-10-CM | POA: Diagnosis not present

## 2020-12-26 DIAGNOSIS — G894 Chronic pain syndrome: Secondary | ICD-10-CM | POA: Diagnosis not present

## 2021-01-27 DIAGNOSIS — R7309 Other abnormal glucose: Secondary | ICD-10-CM | POA: Diagnosis not present

## 2021-01-27 DIAGNOSIS — E538 Deficiency of other specified B group vitamins: Secondary | ICD-10-CM | POA: Diagnosis not present

## 2021-01-27 DIAGNOSIS — E785 Hyperlipidemia, unspecified: Secondary | ICD-10-CM | POA: Diagnosis not present

## 2021-01-27 DIAGNOSIS — I10 Essential (primary) hypertension: Secondary | ICD-10-CM | POA: Diagnosis not present

## 2021-01-27 DIAGNOSIS — E559 Vitamin D deficiency, unspecified: Secondary | ICD-10-CM | POA: Diagnosis not present

## 2021-01-27 DIAGNOSIS — E039 Hypothyroidism, unspecified: Secondary | ICD-10-CM | POA: Diagnosis not present

## 2021-01-28 ENCOUNTER — Other Ambulatory Visit (HOSPITAL_COMMUNITY): Payer: Self-pay | Admitting: Family Medicine

## 2021-01-28 DIAGNOSIS — E2839 Other primary ovarian failure: Secondary | ICD-10-CM

## 2021-02-24 DIAGNOSIS — G894 Chronic pain syndrome: Secondary | ICD-10-CM | POA: Diagnosis not present

## 2021-02-28 ENCOUNTER — Other Ambulatory Visit: Payer: Self-pay

## 2021-02-28 ENCOUNTER — Ambulatory Visit (HOSPITAL_COMMUNITY)
Admission: RE | Admit: 2021-02-28 | Discharge: 2021-02-28 | Disposition: A | Discharge status: Home / Self Care | Payer: Medicare Other | Source: Ambulatory Visit | Attending: Family Medicine | Admitting: Family Medicine

## 2021-02-28 DIAGNOSIS — E2839 Other primary ovarian failure: Secondary | ICD-10-CM | POA: Diagnosis not present

## 2021-02-28 DIAGNOSIS — M85831 Other specified disorders of bone density and structure, right forearm: Secondary | ICD-10-CM | POA: Diagnosis not present

## 2021-02-28 DIAGNOSIS — M85852 Other specified disorders of bone density and structure, left thigh: Secondary | ICD-10-CM | POA: Diagnosis not present

## 2021-03-12 DIAGNOSIS — E039 Hypothyroidism, unspecified: Secondary | ICD-10-CM | POA: Diagnosis not present

## 2021-03-12 DIAGNOSIS — I10 Essential (primary) hypertension: Secondary | ICD-10-CM | POA: Diagnosis not present

## 2021-03-31 DIAGNOSIS — G894 Chronic pain syndrome: Secondary | ICD-10-CM | POA: Diagnosis not present

## 2021-04-12 DIAGNOSIS — E039 Hypothyroidism, unspecified: Secondary | ICD-10-CM | POA: Diagnosis not present

## 2021-04-12 DIAGNOSIS — I10 Essential (primary) hypertension: Secondary | ICD-10-CM | POA: Diagnosis not present

## 2021-04-23 DIAGNOSIS — G894 Chronic pain syndrome: Secondary | ICD-10-CM | POA: Diagnosis not present

## 2021-05-13 DIAGNOSIS — E039 Hypothyroidism, unspecified: Secondary | ICD-10-CM | POA: Diagnosis not present

## 2021-05-13 DIAGNOSIS — I10 Essential (primary) hypertension: Secondary | ICD-10-CM | POA: Diagnosis not present

## 2021-05-21 DIAGNOSIS — G894 Chronic pain syndrome: Secondary | ICD-10-CM | POA: Diagnosis not present

## 2021-06-12 DIAGNOSIS — E039 Hypothyroidism, unspecified: Secondary | ICD-10-CM | POA: Diagnosis not present

## 2021-06-12 DIAGNOSIS — I10 Essential (primary) hypertension: Secondary | ICD-10-CM | POA: Diagnosis not present

## 2021-06-23 DIAGNOSIS — G894 Chronic pain syndrome: Secondary | ICD-10-CM | POA: Diagnosis not present

## 2021-07-13 DIAGNOSIS — I10 Essential (primary) hypertension: Secondary | ICD-10-CM | POA: Diagnosis not present

## 2021-07-13 DIAGNOSIS — E039 Hypothyroidism, unspecified: Secondary | ICD-10-CM | POA: Diagnosis not present

## 2021-07-22 DIAGNOSIS — G894 Chronic pain syndrome: Secondary | ICD-10-CM | POA: Diagnosis not present

## 2021-08-13 DIAGNOSIS — I10 Essential (primary) hypertension: Secondary | ICD-10-CM | POA: Diagnosis not present

## 2021-08-13 DIAGNOSIS — E039 Hypothyroidism, unspecified: Secondary | ICD-10-CM | POA: Diagnosis not present

## 2021-08-20 DIAGNOSIS — I10 Essential (primary) hypertension: Secondary | ICD-10-CM | POA: Diagnosis not present

## 2021-08-20 DIAGNOSIS — E785 Hyperlipidemia, unspecified: Secondary | ICD-10-CM | POA: Diagnosis not present

## 2021-08-20 DIAGNOSIS — E559 Vitamin D deficiency, unspecified: Secondary | ICD-10-CM | POA: Diagnosis not present

## 2021-08-20 DIAGNOSIS — E538 Deficiency of other specified B group vitamins: Secondary | ICD-10-CM | POA: Diagnosis not present

## 2021-08-20 DIAGNOSIS — R7309 Other abnormal glucose: Secondary | ICD-10-CM | POA: Diagnosis not present

## 2021-08-20 DIAGNOSIS — Z23 Encounter for immunization: Secondary | ICD-10-CM | POA: Diagnosis not present

## 2021-08-20 DIAGNOSIS — E039 Hypothyroidism, unspecified: Secondary | ICD-10-CM | POA: Diagnosis not present

## 2021-08-20 DIAGNOSIS — M1991 Primary osteoarthritis, unspecified site: Secondary | ICD-10-CM | POA: Diagnosis not present

## 2021-08-20 DIAGNOSIS — G894 Chronic pain syndrome: Secondary | ICD-10-CM | POA: Diagnosis not present

## 2021-09-19 DIAGNOSIS — G8929 Other chronic pain: Secondary | ICD-10-CM | POA: Diagnosis not present

## 2021-09-29 DIAGNOSIS — D0471 Carcinoma in situ of skin of right lower limb, including hip: Secondary | ICD-10-CM | POA: Diagnosis not present

## 2021-09-29 DIAGNOSIS — C44712 Basal cell carcinoma of skin of right lower limb, including hip: Secondary | ICD-10-CM | POA: Diagnosis not present

## 2021-10-17 DIAGNOSIS — I10 Essential (primary) hypertension: Secondary | ICD-10-CM | POA: Diagnosis not present

## 2021-10-17 DIAGNOSIS — M1991 Primary osteoarthritis, unspecified site: Secondary | ICD-10-CM | POA: Diagnosis not present

## 2021-10-17 DIAGNOSIS — E039 Hypothyroidism, unspecified: Secondary | ICD-10-CM | POA: Diagnosis not present

## 2021-10-17 DIAGNOSIS — E538 Deficiency of other specified B group vitamins: Secondary | ICD-10-CM | POA: Diagnosis not present

## 2021-10-17 DIAGNOSIS — E782 Mixed hyperlipidemia: Secondary | ICD-10-CM | POA: Diagnosis not present

## 2021-10-17 DIAGNOSIS — G894 Chronic pain syndrome: Secondary | ICD-10-CM | POA: Diagnosis not present

## 2021-10-17 DIAGNOSIS — E559 Vitamin D deficiency, unspecified: Secondary | ICD-10-CM | POA: Diagnosis not present

## 2021-11-13 DIAGNOSIS — Z85828 Personal history of other malignant neoplasm of skin: Secondary | ICD-10-CM | POA: Diagnosis not present

## 2021-11-13 DIAGNOSIS — D0472 Carcinoma in situ of skin of left lower limb, including hip: Secondary | ICD-10-CM | POA: Diagnosis not present

## 2021-11-13 DIAGNOSIS — B078 Other viral warts: Secondary | ICD-10-CM | POA: Diagnosis not present

## 2021-11-13 DIAGNOSIS — Z08 Encounter for follow-up examination after completed treatment for malignant neoplasm: Secondary | ICD-10-CM | POA: Diagnosis not present

## 2021-11-26 DIAGNOSIS — G894 Chronic pain syndrome: Secondary | ICD-10-CM | POA: Diagnosis not present

## 2021-12-18 DIAGNOSIS — X32XXXD Exposure to sunlight, subsequent encounter: Secondary | ICD-10-CM | POA: Diagnosis not present

## 2021-12-18 DIAGNOSIS — L570 Actinic keratosis: Secondary | ICD-10-CM | POA: Diagnosis not present

## 2021-12-18 DIAGNOSIS — Z08 Encounter for follow-up examination after completed treatment for malignant neoplasm: Secondary | ICD-10-CM | POA: Diagnosis not present

## 2021-12-18 DIAGNOSIS — Z85828 Personal history of other malignant neoplasm of skin: Secondary | ICD-10-CM | POA: Diagnosis not present

## 2021-12-18 DIAGNOSIS — C44722 Squamous cell carcinoma of skin of right lower limb, including hip: Secondary | ICD-10-CM | POA: Diagnosis not present

## 2021-12-29 DIAGNOSIS — G894 Chronic pain syndrome: Secondary | ICD-10-CM | POA: Diagnosis not present

## 2021-12-29 DIAGNOSIS — E538 Deficiency of other specified B group vitamins: Secondary | ICD-10-CM | POA: Diagnosis not present

## 2021-12-29 DIAGNOSIS — N390 Urinary tract infection, site not specified: Secondary | ICD-10-CM | POA: Diagnosis not present

## 2021-12-29 DIAGNOSIS — E559 Vitamin D deficiency, unspecified: Secondary | ICD-10-CM | POA: Diagnosis not present

## 2021-12-29 DIAGNOSIS — E039 Hypothyroidism, unspecified: Secondary | ICD-10-CM | POA: Diagnosis not present

## 2021-12-29 DIAGNOSIS — I10 Essential (primary) hypertension: Secondary | ICD-10-CM | POA: Diagnosis not present

## 2021-12-29 DIAGNOSIS — Z0000 Encounter for general adult medical examination without abnormal findings: Secondary | ICD-10-CM | POA: Diagnosis not present

## 2021-12-29 DIAGNOSIS — E782 Mixed hyperlipidemia: Secondary | ICD-10-CM | POA: Diagnosis not present

## 2021-12-29 DIAGNOSIS — M1991 Primary osteoarthritis, unspecified site: Secondary | ICD-10-CM | POA: Diagnosis not present

## 2021-12-29 DIAGNOSIS — E7849 Other hyperlipidemia: Secondary | ICD-10-CM | POA: Diagnosis not present

## 2022-01-29 DIAGNOSIS — G894 Chronic pain syndrome: Secondary | ICD-10-CM | POA: Diagnosis not present

## 2022-02-17 ENCOUNTER — Observation Stay (HOSPITAL_COMMUNITY)
Admission: EM | Admit: 2022-02-17 | Discharge: 2022-02-18 | Disposition: A | Discharge status: Home / Self Care | Payer: Medicare Other | Attending: Family Medicine | Admitting: Family Medicine

## 2022-02-17 ENCOUNTER — Emergency Department (HOSPITAL_COMMUNITY): Payer: Medicare Other

## 2022-02-17 ENCOUNTER — Encounter (HOSPITAL_COMMUNITY): Payer: Self-pay | Admitting: *Deleted

## 2022-02-17 ENCOUNTER — Other Ambulatory Visit: Payer: Self-pay

## 2022-02-17 DIAGNOSIS — R918 Other nonspecific abnormal finding of lung field: Secondary | ICD-10-CM | POA: Diagnosis not present

## 2022-02-17 DIAGNOSIS — I517 Cardiomegaly: Secondary | ICD-10-CM | POA: Diagnosis not present

## 2022-02-17 DIAGNOSIS — R911 Solitary pulmonary nodule: Secondary | ICD-10-CM | POA: Diagnosis not present

## 2022-02-17 DIAGNOSIS — E039 Hypothyroidism, unspecified: Secondary | ICD-10-CM | POA: Diagnosis not present

## 2022-02-17 DIAGNOSIS — I700 Atherosclerosis of aorta: Secondary | ICD-10-CM

## 2022-02-17 DIAGNOSIS — Z7982 Long term (current) use of aspirin: Secondary | ICD-10-CM | POA: Insufficient documentation

## 2022-02-17 DIAGNOSIS — I2699 Other pulmonary embolism without acute cor pulmonale: Principal | ICD-10-CM

## 2022-02-17 DIAGNOSIS — I480 Paroxysmal atrial fibrillation: Secondary | ICD-10-CM

## 2022-02-17 DIAGNOSIS — R600 Localized edema: Secondary | ICD-10-CM | POA: Diagnosis not present

## 2022-02-17 DIAGNOSIS — Z79899 Other long term (current) drug therapy: Secondary | ICD-10-CM | POA: Insufficient documentation

## 2022-02-17 DIAGNOSIS — R0789 Other chest pain: Secondary | ICD-10-CM | POA: Diagnosis not present

## 2022-02-17 DIAGNOSIS — R609 Edema, unspecified: Secondary | ICD-10-CM

## 2022-02-17 DIAGNOSIS — Z87891 Personal history of nicotine dependence: Secondary | ICD-10-CM | POA: Diagnosis not present

## 2022-02-17 DIAGNOSIS — R079 Chest pain, unspecified: Secondary | ICD-10-CM | POA: Diagnosis not present

## 2022-02-17 DIAGNOSIS — R931 Abnormal findings on diagnostic imaging of heart and coronary circulation: Secondary | ICD-10-CM

## 2022-02-17 DIAGNOSIS — J90 Pleural effusion, not elsewhere classified: Secondary | ICD-10-CM | POA: Diagnosis not present

## 2022-02-17 DIAGNOSIS — I10 Essential (primary) hypertension: Secondary | ICD-10-CM | POA: Diagnosis present

## 2022-02-17 DIAGNOSIS — Z20822 Contact with and (suspected) exposure to covid-19: Secondary | ICD-10-CM | POA: Insufficient documentation

## 2022-02-17 DIAGNOSIS — F419 Anxiety disorder, unspecified: Secondary | ICD-10-CM

## 2022-02-17 LAB — HEPATIC FUNCTION PANEL
ALT: 10 U/L (ref 0–44)
AST: 14 U/L — ABNORMAL LOW (ref 15–41)
Albumin: 3.7 g/dL (ref 3.5–5.0)
Alkaline Phosphatase: 45 U/L (ref 38–126)
Bilirubin, Direct: 0.1 mg/dL (ref 0.0–0.2)
Indirect Bilirubin: 0.6 mg/dL (ref 0.3–0.9)
Total Bilirubin: 0.7 mg/dL (ref 0.3–1.2)
Total Protein: 6.8 g/dL (ref 6.5–8.1)

## 2022-02-17 LAB — BASIC METABOLIC PANEL
Anion gap: 8 (ref 5–15)
BUN: 15 mg/dL (ref 8–23)
CO2: 27 mmol/L (ref 22–32)
Calcium: 8.9 mg/dL (ref 8.9–10.3)
Chloride: 103 mmol/L (ref 98–111)
Creatinine, Ser: 0.73 mg/dL (ref 0.44–1.00)
GFR, Estimated: >60 mL/min (ref >60)
Glucose, Bld: 110 mg/dL — ABNORMAL HIGH (ref 70–99)
Potassium: 4.2 mmol/L (ref 3.5–5.1)
Sodium: 138 mmol/L (ref 135–145)

## 2022-02-17 LAB — RESP PANEL BY RT-PCR (FLU A&B, COVID) ARPGX2
Influenza A by PCR: NEGATIVE
Influenza B by PCR: NEGATIVE
SARS Coronavirus 2 by RT PCR: NEGATIVE

## 2022-02-17 LAB — TROPONIN I (HIGH SENSITIVITY)
Troponin I (High Sensitivity): 4 ng/L (ref <18)
Troponin I (High Sensitivity): 3 ng/L (ref <18)

## 2022-02-17 LAB — CBC
HCT: 37 % (ref 36.0–46.0)
Hemoglobin: 11.6 g/dL — ABNORMAL LOW (ref 12.0–15.0)
MCH: 28.8 pg (ref 26.0–34.0)
MCHC: 31.4 g/dL (ref 30.0–36.0)
MCV: 91.8 fL (ref 80.0–100.0)
Platelets: 225 10*3/uL (ref 150–400)
RBC: 4.03 MIL/uL (ref 3.87–5.11)
RDW: 13.3 % (ref 11.5–15.5)
WBC: 8.9 10*3/uL (ref 4.0–10.5)
nRBC: 0 % (ref 0.0–0.2)

## 2022-02-17 LAB — D-DIMER, QUANTITATIVE: D-Dimer, Quant: 1.06 ug/mL-FEU — ABNORMAL HIGH (ref 0.00–0.50)

## 2022-02-17 MED ORDER — POLYETHYLENE GLYCOL 3350 17 G PO PACK: 17.0000 g | PACK | Freq: Every day | ORAL | Status: DC | PRN

## 2022-02-17 MED ORDER — ALPRAZOLAM 0.5 MG PO TABS
0.5000 mg | ORAL_TABLET | Freq: Two times a day (BID) | ORAL | Status: DC | PRN
  Administered 2022-02-17: 0.5 mg via ORAL
  Filled 2022-02-17: qty 1

## 2022-02-17 MED ORDER — LEVOTHYROXINE SODIUM 100 MCG PO TABS
100.0000 ug | ORAL_TABLET | Freq: Every day | ORAL | Status: DC
  Administered 2022-02-18: 100 ug via ORAL
  Filled 2022-02-17: qty 1

## 2022-02-17 MED ORDER — ACETAMINOPHEN 325 MG PO TABS: 650.0000 mg | ORAL_TABLET | Freq: Four times a day (QID) | ORAL | Status: DC | PRN

## 2022-02-17 MED ORDER — BACLOFEN 10 MG PO TABS
5.0000 mg | ORAL_TABLET | Freq: Two times a day (BID) | ORAL | Status: DC
  Administered 2022-02-17 – 2022-02-18 (×2): 5 mg via ORAL
  Filled 2022-02-17 (×2): qty 1

## 2022-02-17 MED ORDER — ONDANSETRON HCL 4 MG/2ML IJ SOLN: 4.0000 mg | Freq: Four times a day (QID) | INTRAMUSCULAR | Status: DC | PRN

## 2022-02-17 MED ORDER — ACETAMINOPHEN 650 MG RE SUPP: 650.0000 mg | Freq: Four times a day (QID) | RECTAL | Status: DC | PRN

## 2022-02-17 MED ORDER — HYDROCODONE-ACETAMINOPHEN 7.5-325 MG PO TABS
1.0000 | ORAL_TABLET | Freq: Four times a day (QID) | ORAL | Status: DC | PRN
  Administered 2022-02-17 – 2022-02-18 (×2): 1 via ORAL
  Filled 2022-02-17 (×2): qty 1

## 2022-02-17 MED ORDER — DILTIAZEM HCL ER COATED BEADS 180 MG PO CP24
180.0000 mg | ORAL_CAPSULE | Freq: Every evening | ORAL | Status: DC
  Administered 2022-02-17: 180 mg via ORAL
  Filled 2022-02-17: qty 1

## 2022-02-17 MED ORDER — ATORVASTATIN CALCIUM 40 MG PO TABS
80.0000 mg | ORAL_TABLET | Freq: Every day | ORAL | Status: DC
  Administered 2022-02-18: 80 mg via ORAL
  Filled 2022-02-17: qty 2

## 2022-02-17 MED ORDER — ASPIRIN EC 81 MG PO TBEC
81.0000 mg | DELAYED_RELEASE_TABLET | Freq: Every day | ORAL | Status: DC
  Administered 2022-02-18: 81 mg via ORAL
  Filled 2022-02-17: qty 1

## 2022-02-17 MED ORDER — IOHEXOL 350 MG/ML SOLN
100.0000 mL | Freq: Once | INTRAVENOUS | Status: AC | PRN
  Administered 2022-02-17: 75 mL via INTRAVENOUS

## 2022-02-17 MED ORDER — ENOXAPARIN SODIUM 40 MG/0.4ML IJ SOSY
40.0000 mg | PREFILLED_SYRINGE | INTRAMUSCULAR | Status: DC
  Administered 2022-02-17: 40 mg via SUBCUTANEOUS
  Filled 2022-02-17: qty 0.4

## 2022-02-17 MED ORDER — ONDANSETRON HCL 4 MG PO TABS: 4.0000 mg | ORAL_TABLET | Freq: Four times a day (QID) | ORAL | Status: DC | PRN

## 2022-02-17 NOTE — Plan of Care (Signed)
?  Problem: Clinical Measurements: Goal: Ability to maintain clinical measurements within normal limits will improve Outcome: Progressing Goal: Will remain free from infection Outcome: Progressing Goal: Diagnostic test results will improve Outcome: Progressing Goal: Respiratory complications will improve Outcome: Progressing Goal: Cardiovascular complication will be avoided Outcome: Progressing   Problem: Elimination: Goal: Will not experience complications related to bowel motility Outcome: Progressing Goal: Will not experience complications related to urinary retention Outcome: Progressing   Problem: Pain Managment: Goal: General experience of comfort will improve Outcome: Progressing   Problem: Safety: Goal: Ability to remain free from injury will improve Outcome: Progressing   

## 2022-02-17 NOTE — H&P (Signed)
History and Physical    Kelsey Ruiz TGG:[id number] DOB: 02-09-1929 DOA: 02/17/2022  PCP: Kelsey Sites, MD   Patient coming from: Home  I have personally briefly reviewed patient's old medical records in [place]  Chief Complaint: Chest pain  HPI: Kelsey Ruiz is a 88 y.o. female with medical history significant for hypertension, dyslipidemia, atrial fibrillation. Patient presented to the ED with complaints of left-sided chest pain.  She reports symptoms initially started as a sharp pain underneath her left breast, gradually became a heaviness in her left chest.  This has been ongoing for about 2 weeks now.  Chest pain is nonradiating and not related to activity, she has occurred several times while resting, and would last about 30 minutes and self resolve.  She reports associated transient difficulty breathing.  No associated diaphoresis, nausea or vomiting,.  She has chronic unchanged bilateral lower extremity swelling for which she takes as needed Lasix and elevates her legs.  No blood clots in legs or lungs.  She reports a history of atrial fibrillation years ago for which she has been on aspirin and compliant.  She was never placed on anticoagulation, she tells me it was not recommended to have to take it.  ED Course: Temperature 98.6.  Heart rate 65 -97.  Respiratory rate 12-18.  Blood pressure 120s to 160s.  O2 sats 91 to 98% on room air.  D-dimer 1.06.  Troponin 3 > 4.  Chest x-ray clear.  COVID test negative.  EKG shows without acute changes. CTA chest shows- Tiny filling defects within right lower lobe subsegmental pulmonary artery branches, most consistent with small volume pulmonary emboli.  Cardiology was consulted in the ED. Hospitalist to admit.  Review of Systems: As per HPI all other systems reviewed and negative.  Past Medical History:  Diagnosis Date   Arthritis    GERD (gastroesophageal reflux disease)    Glaucoma    Hx of bladder infections     Hyperlipemia    Hypertension    Hypothyroidism     Past Surgical History:  Procedure Laterality Date   ABDOMINAL HYSTERECTOMY     BREAST BIOPSY Right 04/11/2014   Procedure: RIGHT BREAST BIOPSY AFTER NEEDLE LOCALIZATION;  Surgeon: Jamesetta So, MD;  Location: [facility];  Service: General;  Laterality: Right;  NEEDLE LOC @ 8:00   BREAST LUMPECTOMY Right    BREAST LUMPECTOMY Right    BREAST SURGERY Bilateral    benign cysts   CESAREAN SECTION     laser eyes Bilateral    for glaucoma   THYROIDECTOMY       reports that she quit smoking about 37 years ago. Her smoking use included cigarettes. She has a 5.00 pack-year smoking history. She has never used smokeless tobacco. She reports that she does not drink alcohol and does not use drugs.  Allergies  Allergen Reactions   Sulfa Antibiotics Nausea And Vomiting    Family History  Problem Relation Age of Onset   Breast cancer Neg Hx     Prior to Admission medications   Medication Sig Start Date End Date Taking? Authorizing Provider  alendronate (FOSAMAX) 70 MG tablet Take 70 mg by mouth once a week. Take with a full glass of water on an empty stomach. Take on Saturday.   Yes [provider]  ALPRAZolam Duanne Moron) 0.5 MG tablet Take 0.5 mg by mouth 2 (two) times daily as needed. 12/09/21  Yes [provider]  aspirin EC 81 MG tablet Take  81 mg by mouth daily.   Yes [provider]  atorvastatin (LIPITOR) 80 MG tablet Take 80 mg by mouth daily.  08/17/18  Yes [provider]  baclofen (LIORESAL) 10 MG tablet Take 5 mg by mouth 2 (two) times daily. 01/14/22  Yes [provider]  benzonatate (TESSALON) 200 MG capsule Take 1 capsule (200 mg total) by mouth 3 (three) times daily as needed for cough. 10/16/19  Yes Gherghe, Vella Redhead, MD  Calcium-Magnesium-Zinc (CAL-MAG-ZINC PO) Take 1 tablet by mouth every morning.    Yes [provider]  cholecalciferol (VITAMIN D) 1000 UNITS tablet Take 1,000 Units  by mouth every morning.    Yes [provider]  Coenzyme Q10 200 MG capsule Take 200 mg by mouth every morning.    Yes [provider]  Cyanocobalamin (VITAMIN B-12 CR) 1500 MCG TBCR Take 1 tablet by mouth daily.   Yes [provider]  diltiazem (CARDIZEM CD) 180 MG 24 hr capsule Take 180 mg by mouth See admin instructions. Take 2 capsules with evening meal 08/17/19  Yes [provider]  furosemide (LASIX) 40 MG tablet Take 40 mg by mouth daily as needed. 08/25/19  Yes [provider]  gabapentin (NEURONTIN) 100 MG capsule Take 100 mg by mouth 2 (two) times daily. 08/25/19  Yes [provider]  HYDROcodone-acetaminophen (NORCO) 7.5-325 MG tablet Take 1 tablet by mouth every 6 (six) hours. 01/29/22  Yes [provider]  Lactobacillus (ACIDOPHILUS) 100 MG CAPS Take 1 capsule by mouth daily.   Yes [provider]  levothyroxine (SYNTHROID) 100 MCG tablet Take 100 mcg by mouth daily. 12/30/21  Yes [provider]  meloxicam (MOBIC) 15 MG tablet Take 15 mg by mouth daily.   Yes [provider]  omeprazole (PRILOSEC) 40 MG capsule Take 40 mg by mouth every morning.    Yes [provider]  ondansetron (ZOFRAN ODT) 4 MG disintegrating tablet Take 1 tablet (4 mg total) by mouth every 8 (eight) hours as needed for nausea or vomiting. 11/17/18  Yes Idol, Almyra Free, PA-C  vitamin C (ASCORBIC ACID) 500 MG tablet Take 1,000 mg by mouth every morning.    Yes [provider]  dexamethasone (DECADRON) 4 MG tablet Take 1 tablet (4 mg total) by mouth 2 (two) times daily with a meal. Patient not taking: Reported on 02/17/2022 09/17/18   Lily Kocher, PA-C  dexamethasone (DECADRON) 6 MG tablet Take 1 tablet (6 mg total) by mouth daily. Patient not taking: Reported on 02/17/2022 10/16/19   Caren Griffins, MD  HYDROcodone-homatropine [facility]) 5-1.5 MG/5ML syrup Take 5 mLs by mouth every 6 (six) hours as needed. Patient not  taking: Reported on 11/17/2018 09/17/18   Lily Kocher, PA-C    Physical Exam: Vitals:   02/17/22 1345 02/17/22 1400 02/17/22 1430 02/17/22 1500  BP: (!) 152/74 (!) 154/68 (!) 147/69   Pulse: 67 68 77 80  Resp: '12 13 18 '$ (!) 22  Temp:      TempSrc:      SpO2: 95% 94% 94% 96%    Constitutional: NAD, calm, comfortable Vitals:   02/17/22 1345 02/17/22 1400 02/17/22 1430 02/17/22 1500  BP: (!) 152/74 (!) 154/68 (!) 147/69   Pulse: 67 68 77 80  Resp: '12 13 18 '$ (!) 22  Temp:      TempSrc:      SpO2: 95% 94% 94% 96%   Eyes: PERRL, lids and conjunctivae normal ENMT: Mucous membranes are moist.   Neck:  normal, supple, no masses, no thyromegaly Respiratory: clear to auscultation bilaterally, no wheezing, no crackles. Normal respiratory effort. No accessory muscle use.  Cardiovascular: Regular rate and rhythm, no murmurs / rubs / gallops. Trace extremity edema.  Lower extremities warm. Abdomen: no tenderness, no masses palpated. No hepatosplenomegaly. Bowel sounds positive.  Musculoskeletal: no clubbing / cyanosis. No joint deformity upper and lower extremities. Good ROM, no contractures. Normal muscle tone.  Skin: no rashes, lesions, ulcers. No induration Neurologic: No apparent cranial abnormality, moving extremities spontaneously. Psychiatric: Normal judgment and insight. Alert and oriented x 3. Normal mood.   Labs on Admission: I have personally reviewed following labs and imaging studies  CBC: Recent Labs  Lab 02/17/22 1227  WBC 8.9  HGB 11.6*  HCT 37.0  MCV 91.8  PLT 254   Basic Metabolic Panel: Recent Labs  Lab 02/17/22 1227  NA 138  K 4.2  CL 103  CO2 27  GLUCOSE 110*  BUN 15  CREATININE 0.73  CALCIUM 8.9   GFR: CrCl cannot be calculated (Unknown ideal weight.). Liver Function Tests: Recent Labs  Lab 02/17/22 1227  AST 14*  ALT 10  ALKPHOS 45  BILITOT 0.7  PROT 6.8  ALBUMIN 3.7   Radiological Exams on Admission: DG Chest 2 View  Result Date:  02/17/2022 CLINICAL DATA:  Chest pain EXAM: CHEST - 2 VIEW COMPARISON:  10/10/2019 FINDINGS: Cardiac size is within normal limits. There are no signs of pulmonary edema or focal pulmonary consolidation. There is no pleural effusion or pneumothorax. Degenerative changes are noted in both shoulders. IMPRESSION: No active cardiopulmonary disease. Electronically Signed   By: Elmer Picker M.D.   On: 02/17/2022 12:48   CT Angio Chest PE W and/or Wo Contrast  Result Date: 02/17/2022 CLINICAL DATA:  Chest pain and atrial fibrillation. Elevated D-dimer. Suspicion of pulmonary embolism. EXAM: CT ANGIOGRAPHY CHEST WITH CONTRAST TECHNIQUE: Multidetector CT imaging of the chest was performed using the standard protocol during bolus administration of intravenous contrast. Multiplanar CT image reconstructions and MIPs were obtained to evaluate the vascular anatomy. RADIATION DOSE REDUCTION: This exam was performed according to the departmental dose-optimization program which includes automated exposure control, adjustment of the mA and/or kV according to patient size and/or use of iterative reconstruction technique. CONTRAST:  95m OMNIPAQUE IOHEXOL 350 MG/ML SOLN COMPARISON:  Plain film earlier today.  11/17/2018 chest CT. FINDINGS: Cardiovascular: The quality of this exam for evaluation of pulmonary embolism is good. Subtle filling defect within subsegmental pulmonary artery branches to the right lower lobe including on 212/6 and 108/8. Aortic atherosclerosis. Mild cardiomegaly, without pericardial effusion. Pulmonary artery enlargement, outflow tract 3.6 cm Mediastinum/Nodes: Probable thyroidectomy. No mediastinal or hilar adenopathy. Lungs/Pleura: Small right pleural effusion. Mild right base airspace disease. Areas of nonspecific mucoid impaction including the inferior right middle lobe on 82/7. Subtle areas of right greater than left peripheral interstitial opacity with architectural distortion, new since 2019 and  likely post infectious/inflammatory in etiology. Superior segment right lower lobe mixed attenuation pulmonary nodule. 1.5 cm ground-glass component with 7 mm solid component on 39/7. At the site of a 13 mm ground-glass nodule on 11/17/2018. 3 mm left lower lobe pulmonary nodule on 70/7, new. Upper Abdomen: Normal imaged portions of the liver, spleen, stomach, pancreas, adrenal glands, kidneys. Musculoskeletal: Osteopenia. T8 vertebral augmentation for a severe compression deformity with ventral canal encroachment. Mild superior endplate irregularities including at T2, T6. Accentuated thoracic kyphosis about the T8 level. Review of the MIP images confirms the above findings. IMPRESSION:  1. Tiny filling defects within right lower lobe subsegmental pulmonary artery branches, most consistent with small volume pulmonary emboli. This is of questionable clinical significance. 2. Small right pleural effusion and right base airspace disease which could represent atelectasis or minimal infarct. 3. Enlargement of a superior segment right lower lobe now mixed attenuation nodule, highly suspicious for low-grade adenocarcinoma. 4. Peripheral areas of bilateral pulmonary ground-glass and architectural distortion, likely post infectious or inflammatory. Mild post COVID-19 fibrosis could have this appearance. 5. Pulmonary artery enlargement suggests pulmonary arterial hypertension. 6. New nonspecific 3 mm left lower lobe pulmonary nodule. Electronically Signed   By: Abigail Miyamoto M.D.   On: 02/17/2022 15:53    EKG: Independently reviewed.  Sinus rhythm rate 62.  QTc 403.  No significant ST or T wave changes from prior.  Assessment/Plan Principal Problem:   Chest pain Active Problems:   Hypothyroidism   Hypertension  Chest Pain- atypical chest pain.  She reports a history of A-fib otherwise denies cardiac history.  EKG and troponin both unremarkable.  Apart from aspirin she has not been on any anticoagulation, and neither  was this recommended.   -Evaluated by cardiology - Patient also has some significant social stressors at home relating to her 2 daughters.  She lives with Daughter Arbie Cookey, but Daughter Jenny Reichmann is at bedside.  She declined APS services. - may benefit from TOC eval, if patient is agreeable -Obtain echocardiogram -Continue aspirin  Tiny/Low-volume PE- CTA chest shows tiny filling defect within the right lower lobe subsegmental pulmonary artery branches most consistent with small volume PE.  Per radiologist this is of questionable clinical significance. -Doubt that this tiny filling defects are etiology of patient's symptoms. - She denies history of GI blood loss, but reports she is prone to and has had multiple falls, has with 3 falls with resultant hip fractures.  Currently she ambulates with a walker. -Will obtain bilateral lower extremity venous Dopplers -Considering patient's advanced age, high risk for falls, and low volume PE, risk of anticoagulation may outweigh the benefit. -Discussed with cardiology, for now unless significant DVT or change in clinical status, it may be reasonable to forego anticoagulation. -Continue aspirin  Reported Atrial fibrillation history-I do not see documentation of this.  EKG on file are all sinus rhythm.  Reports she was placed on aspirin for this.  Has never been on anticoagulation.  She does not have a cardiologist. -Resume aspirin  Hypertension-  -Resume diltiazem 180 mg daily  Hypothyroidism -Resume Synthroid   DVT prophylaxis: Lovenox Code Status: Full code Family Communication: Daughter Jenny Reichmann at bedside Disposition Plan:  ~ 1 day Consults called: Cards Admission status: obs tele  Bethena Roys MD [facility]  02/17/2022, 6:20 PM

## 2022-02-17 NOTE — ED Triage Notes (Signed)
Chest heaviness history of afib ?

## 2022-02-17 NOTE — ED Provider Notes (Signed)
[facility] Provider Note   CSN: [id number] Arrival date & time: 02/17/22  1200     History  Chief Complaint  Patient presents with   Chest Pain    Kelsey Ruiz is a 88 y.o. female.  Patient complains of chest pressure that seems to be coming on more frequently and lasting longer.  This is been going on for a number of weeks.  Patient has a history of thyroid problem  The history is provided by the patient, medical records and a relative. No language interpreter was used.  Chest Pain Pain location:  L chest Pain quality: aching   Pain radiates to:  Does not radiate Pain severity:  Moderate Onset quality:  Sudden Timing:  Intermittent Progression:  Waxing and waning Chronicity:  New Context: not breathing   Relieved by:  Nothing Worsened by:  Nothing Associated symptoms: no abdominal pain, no back pain, no cough, no fatigue and no headache       Home Medications Prior to Admission medications   Medication Sig Start Date End Date Taking? Authorizing Provider  alendronate (FOSAMAX) 70 MG tablet Take 70 mg by mouth once a week. Take with a full glass of water on an empty stomach. Take on Saturday.    [provider]  ALPRAZolam Duanne Moron) 1 MG tablet Take 1 mg by mouth 3 (three) times daily as needed for sleep.  07/26/18   [provider]  aspirin EC 81 MG tablet Take 81 mg by mouth daily.    [provider]  atorvastatin (LIPITOR) 80 MG tablet Take 80 mg by mouth daily.  08/17/18   [provider]  benzonatate (TESSALON) 200 MG capsule Take 1 capsule (200 mg total) by mouth 3 (three) times daily as needed for cough. 10/16/19   Caren Griffins, MD  Calcium-Magnesium-Zinc (CAL-MAG-ZINC PO) Take 1 tablet by mouth every morning.     [provider]  cholecalciferol (VITAMIN D) 1000 UNITS tablet Take 1,000 Units by mouth every morning.     [provider]  Coenzyme Q10 200 MG capsule Take 200 mg by mouth  every morning.     [provider]  Cyanocobalamin (VITAMIN B12) 1000 MCG TBCR Take 1,000 mcg by mouth every morning.     [provider]  dexamethasone (DECADRON) 4 MG tablet Take 1 tablet (4 mg total) by mouth 2 (two) times daily with a meal. 09/17/18   Lily Kocher, PA-C  dexamethasone (DECADRON) 6 MG tablet Take 1 tablet (6 mg total) by mouth daily. 10/16/19   Caren Griffins, MD  diltiazem (CARDIZEM CD) 180 MG 24 hr capsule Take 180 mg by mouth 2 (two) times daily. 08/17/19   [provider]  donepezil (ARICEPT) 10 MG tablet Take 10 mg by mouth at bedtime.    [provider]  furosemide (LASIX) 40 MG tablet Take 40 mg by mouth daily as needed. 08/25/19   [provider]  gabapentin (NEURONTIN) 100 MG capsule Take 100 mg by mouth 3 (three) times daily. 08/25/19   [provider]  HYDROcodone-homatropine (HYCODAN) 5-1.5 MG/5ML syrup Take 5 mLs by mouth every 6 (six) hours as needed. Patient not taking: Reported on 11/17/2018 09/17/18   Lily Kocher, PA-C  Lactobacillus (ACIDOPHILUS) 100 MG CAPS Take 1 capsule by mouth daily.    [provider]  levothyroxine (SYNTHROID, LEVOTHROID) 75 MCG tablet Take 75 mcg by mouth daily before breakfast.    [provider]  meloxicam (MOBIC) 15  MG tablet Take 15 mg by mouth daily.    [provider]  nitrofurantoin (MACRODANTIN) 50 MG capsule Take 50 mg by mouth daily.    [provider]  omeprazole (PRILOSEC) 40 MG capsule Take 40 mg by mouth every morning.     [provider]  ondansetron (ZOFRAN ODT) 4 MG disintegrating tablet Take 1 tablet (4 mg total) by mouth every 8 (eight) hours as needed for nausea or vomiting. 11/17/18   Evalee Jefferson, PA-C  traMADol (ULTRAM) 50 MG tablet Take 50 mg by mouth every 6 (six) hours as needed for moderate pain.     [provider]  vitamin C (ASCORBIC ACID) 500 MG tablet Take 1,000 mg by mouth every morning.     [provider]      Allergies    Sulfa antibiotics    Review of Systems   Review of Systems  Constitutional:  Negative for appetite change and fatigue.  HENT:  Negative for congestion, ear discharge and sinus pressure.   Eyes:  Negative for discharge.  Respiratory:  Negative for cough.   Cardiovascular:  Positive for chest pain.  Gastrointestinal:  Negative for abdominal pain and diarrhea.  Genitourinary:  Negative for frequency and hematuria.  Musculoskeletal:  Negative for back pain.  Skin:  Negative for rash.  Neurological:  Negative for seizures and headaches.  Psychiatric/Behavioral:  Negative for hallucinations.    Physical Exam Updated Vital Signs BP (!) 147/69    Pulse 80    Temp 98.6 F (37 C) (Oral)    Resp (!) 22    SpO2 96%  Physical Exam Vitals and nursing note reviewed.  Constitutional:      Appearance: She is well-developed.  HENT:     Head: Normocephalic.  Eyes:     General: No scleral icterus.    Conjunctiva/sclera: Conjunctivae normal.  Neck:     Thyroid: No thyromegaly.  Cardiovascular:     Rate and Rhythm: Normal rate and regular rhythm.     Heart sounds: No murmur heard.   No friction rub. No gallop.  Pulmonary:     Breath sounds: No stridor. No wheezing or rales.  Chest:     Chest wall: No tenderness.  Abdominal:     General: There is no distension.     Tenderness: There is no abdominal tenderness. There is no rebound.  Musculoskeletal:        General: Normal range of motion.     Cervical back: Neck supple.  Lymphadenopathy:     Cervical: No cervical adenopathy.  Skin:    Findings: No erythema or rash.  Neurological:     Mental Status: She is alert and oriented to person, place, and time.     Motor: No abnormal muscle tone.     Coordination: Coordination normal.  Psychiatric:        Behavior: Behavior normal.    ED Results / Procedures / Treatments   Labs (all labs ordered are listed, but only abnormal results are displayed) Labs  Reviewed  BASIC METABOLIC PANEL - Abnormal; Notable for the following components:      Result Value   Glucose, Bld 110 (*)    All other components within normal limits  CBC - Abnormal; Notable for the following components:   Hemoglobin 11.6 (*)    All other components within normal limits  HEPATIC FUNCTION PANEL - Abnormal; Notable for the following components:   AST 14 (*)    All other components  within normal limits  D-DIMER, QUANTITATIVE - Abnormal; Notable for the following components:   D-Dimer, Quant 1.06 (*)    All other components within normal limits  TROPONIN I (HIGH SENSITIVITY)  TROPONIN I (HIGH SENSITIVITY)    EKG EKG Interpretation  Date/Time:  Tuesday February 17 2022 12:16:57 EST Ventricular Rate:  62 PR Interval:  184 QRS Duration: 92 QT Interval:  398 QTC Calculation: 403 R Axis:   48 Text Interpretation: Normal sinus rhythm Nonspecific ST abnormality Abnormal ECG When compared with ECG of 10-Oct-2019 17:58, PREVIOUS ECG IS PRESENT Confirmed by Milton Ferguson [id number]) on 02/17/2022 1:18:54 PM  Radiology DG Chest 2 View  Result Date: 02/17/2022 CLINICAL DATA:  Chest pain EXAM: CHEST - 2 VIEW COMPARISON:  10/10/2019 FINDINGS: Cardiac size is within normal limits. There are no signs of pulmonary edema or focal pulmonary consolidation. There is no pleural effusion or pneumothorax. Degenerative changes are noted in both shoulders. IMPRESSION: No active cardiopulmonary disease. Electronically Signed   By: Elmer Picker M.D.   On: 02/17/2022 12:48    Procedures Procedures    Medications Ordered in ED Medications  iohexol (OMNIPAQUE) 350 MG/ML injection 100 mL (75 mLs Intravenous Contrast Given 02/17/22 1514)    ED Course/ Medical Decision Making/ A&P                           Medical Decision Making Amount and/or Complexity of Data Reviewed Labs: ordered. Radiology: ordered.  Risk Prescription drug management. Decision regarding hospitalization.   This  patient presents to the ED for concern of chest pain, this involves an extensive number of treatment options, and is a complaint that carries with it a high risk of complications and morbidity.  The differential diagnosis includes coronary artery disease, PE   Co morbidities that complicate the patient evaluation  Thyroid disease   Additional history obtained:  Additional history obtained from daughter External records from outside source obtained and reviewed including hospital record   Lab Tests:  I Ordered, and personally interpreted labs.  The pertinent results include: CBC, chemistries, troponin negative   Imaging Studies ordered:  I ordered imaging studies including chest x-ray and CT chest I independently visualized and interpreted imaging which showed small PE I agree with the radiologist interpretation   Cardiac Monitoring:  The patient was maintained on a cardiac monitor.  I personally viewed and interpreted the cardiac monitored which showed an underlying rhythm of: Normal sinus rhythm   Medicines ordered and prescription drug management:  I ordered medication including no medicines ordered Reevaluation of the patient after these medicines showed that the patient stayed the same I have reviewed the patients home medicines and have made adjustments as needed   Test Considered:  None   Critical Interventions:  Admission   Consultations Obtained:  I requested consultation with the hospital,  and discussed lab and imaging findings as well as pertinent plan - they recommend: Admission   Problem List / ED Course:  Chest pain with PE    Social Determinants of Health:  None     . Patient with worsening chest discomfort.  CT angio pending.  Patient will be seen by cardiology with medicine admission        Final Clinical Impression(s) / ED Diagnoses Final diagnoses:  Atypical chest pain    Rx / DC Orders ED Discharge Orders     None          Milton Ferguson, MD  02/17/22 1729 ° °

## 2022-02-17 NOTE — Consult Note (Addendum)
Cardiology Consult:   Patient ID: Kelsey Ruiz MRN: [id number]; DOB: 04/26/1929   Admission date: 02/17/2022  PCP:  Sharilyn Sites, MD   [facility] Providers Cardiologist:  None       Chief Complaint:  chest pain  Patient Profile:   Kelsey Ruiz is a 88 y.o. female with HTN, HLD , Aortic atherosclerosis, hypothyroidism, and GERD  who is being seen 02/17/2022 for the evaluation of chest pain.  History of Present Illness:   Kelsey Ruiz notes that she is feeling poorly.  She has been doing ok until February of 2023.  At that time had left sided chest pain under her left breast,  Intermittent had new chest pressure since  February.  No shortness of breath, DOE .  No PND or orthopnea.  No bendopnea, weight gain, leg swelling , or abdominal swelling.  No syncope or near syncope .  Went to her PCP because of recurrent chest pain and ED visit was advised.  Has chronic arthritis and take low dose opioids to manage the pain, closely followed by her PCP.  Significant stress at home: there are some issues between her two daughters.  She lives with Kelsey Ruiz, but there are some significant interactions that may not make for safe social situation. She has declined APS or further case management at this time.  Notes  no palpitations or funny heart beats.     Patient reports prior cardiac testing including no prior cardiac testing.  In the ED patient no received medications. Key labs notable for troponin 3-4, creatinine 1.14, Hgb 11/6.  Key imaging includes CTPE- no large saddle PE, minimal RCA CAC and aortic atherosclerosis.  There is a very small perfusion defect with possible   Cardiology called for evaluation.   Past Medical History:  Diagnosis Date   Arthritis    GERD (gastroesophageal reflux disease)    Glaucoma    Hx of bladder infections    Hyperlipemia    Hypertension    Hypothyroidism     Past Surgical History:  Procedure Laterality Date   ABDOMINAL  HYSTERECTOMY     BREAST BIOPSY Right 04/11/2014   Procedure: RIGHT BREAST BIOPSY AFTER NEEDLE LOCALIZATION;  Surgeon: Jamesetta So, MD;  Location: [facility];  Service: General;  Laterality: Right;  NEEDLE LOC @ 8:00   BREAST LUMPECTOMY Right    BREAST LUMPECTOMY Right    BREAST SURGERY Bilateral    benign cysts   CESAREAN SECTION     laser eyes Bilateral    for glaucoma   THYROIDECTOMY       Medications Prior to Admission: Prior to Admission medications   Medication Sig Start Date End Date Taking? Authorizing Provider  alendronate (FOSAMAX) 70 MG tablet Take 70 mg by mouth once a week. Take with a full glass of water on an empty stomach. Take on Saturday.    [provider]  ALPRAZolam Duanne Moron) 1 MG tablet Take 1 mg by mouth 3 (three) times daily as needed for sleep.  07/26/18   [provider]  aspirin EC 81 MG tablet Take 81 mg by mouth daily.    [provider]  atorvastatin (LIPITOR) 80 MG tablet Take 80 mg by mouth daily.  08/17/18   [provider]  benzonatate (TESSALON) 200 MG capsule Take 1 capsule (200 mg total) by mouth 3 (three) times daily as needed for cough. 10/16/19   Caren Griffins, MD  Calcium-Magnesium-Zinc (CAL-MAG-ZINC PO) Take 1 tablet by mouth every  morning.     [provider]  cholecalciferol (VITAMIN D) 1000 UNITS tablet Take 1,000 Units by mouth every morning.     [provider]  Coenzyme Q10 200 MG capsule Take 200 mg by mouth every morning.     [provider]  Cyanocobalamin (VITAMIN B12) 1000 MCG TBCR Take 1,000 mcg by mouth every morning.     [provider]  dexamethasone (DECADRON) 4 MG tablet Take 1 tablet (4 mg total) by mouth 2 (two) times daily with a meal. 09/17/18   Lily Kocher, PA-C  dexamethasone (DECADRON) 6 MG tablet Take 1 tablet (6 mg total) by mouth daily. 10/16/19   Caren Griffins, MD  diltiazem (CARDIZEM CD) 180 MG 24 hr capsule Take 180 mg by mouth 2 (two) times daily.  08/17/19   [provider]  donepezil (ARICEPT) 10 MG tablet Take 10 mg by mouth at bedtime.    [provider]  furosemide (LASIX) 40 MG tablet Take 40 mg by mouth daily as needed. 08/25/19   [provider]  gabapentin (NEURONTIN) 100 MG capsule Take 100 mg by mouth 3 (three) times daily. 08/25/19   [provider]  HYDROcodone-homatropine (HYCODAN) 5-1.5 MG/5ML syrup Take 5 mLs by mouth every 6 (six) hours as needed. Patient not taking: Reported on 11/17/2018 09/17/18   Lily Kocher, PA-C  Lactobacillus (ACIDOPHILUS) 100 MG CAPS Take 1 capsule by mouth daily.    [provider]  levothyroxine (SYNTHROID, LEVOTHROID) 75 MCG tablet Take 75 mcg by mouth daily before breakfast.    [provider]  meloxicam (MOBIC) 15 MG tablet Take 15 mg by mouth daily.    [provider]  nitrofurantoin (MACRODANTIN) 50 MG capsule Take 50 mg by mouth daily.    [provider]  omeprazole (PRILOSEC) 40 MG capsule Take 40 mg by mouth every morning.     [provider]  ondansetron (ZOFRAN ODT) 4 MG disintegrating tablet Take 1 tablet (4 mg total) by mouth every 8 (eight) hours as needed for nausea or vomiting. 11/17/18   Evalee Jefferson, PA-C  traMADol (ULTRAM) 50 MG tablet Take 50 mg by mouth every 6 (six) hours as needed for moderate pain.     [provider]  vitamin C (ASCORBIC ACID) 500 MG tablet Take 1,000 mg by mouth every morning.     [provider]     Allergies:    Allergies  Allergen Reactions   Sulfa Antibiotics Nausea And Vomiting    Social History:   Social History   Socioeconomic History   Marital status: Widowed    Spouse name: Not on file   Number of children: Not on file   Years of education: Not on file   Highest education level: Not on file  Occupational History   Not on file  Tobacco Use   Smoking status: Former    Packs/day: 0.50    Years: 10.00    Pack years: 5.00    Types: Cigarettes     Quit date: 04/05/1984    Years since quitting: 37.8   Smokeless tobacco: Never  Vaping Use   Vaping Use: Never used  Substance and Sexual Activity   Alcohol use: No   Drug use: No   Sexual activity: Yes    Birth control/protection: Surgical  Other Topics Concern   Not on file  Social History Narrative   Not on file   Social Determinants of Health   Financial Resource Strain: Not on  file  Food Insecurity: Not on file  Transportation Needs: Not on file  Physical Activity: Not on file  Stress: Not on file  Social Connections: Not on file  Intimate Partner Violence: Not on file    Family History:   The patient's family history is negative for Breast cancer.    ROS:  Please see the history of present illness.  All other ROS reviewed and negative.     Physical Exam/Data:   Vitals:   02/17/22 1345 02/17/22 1400 02/17/22 1430 02/17/22 1500  BP: (!) 152/74 (!) 154/68 (!) 147/69   Pulse: 67 68 77 80  Resp: '12 13 18 '$ (!) 22  Temp:      TempSrc:      SpO2: 95% 94% 94% 96%   No intake or output data in the 24 hours ending 02/17/22 1549 Last 3 Weights 10/10/2019 11/17/2018 09/17/2018  Weight (lbs) 125 lb 145 lb 142 lb  Weight (kg) 56.7 kg 65.772 kg 64.411 kg     There is no height or weight on file to calculate BMI.   Gen: mild distress, elderly female   Neck: No JVD Ears: no Pilar Plate Sign Cardiac: No Rubs or Gallops, no Murmur, RRR, +2 radial pulses Respiratory: Clear to auscultation bilaterally, normal effort, normal respiratory rate GI: Soft, nontender, non-distended  MS: No  edema;  moves all extremities Integument: Skin feels warm, no bruises Neuro:  At time of evaluation, alert and oriented to person/place/time/situation  Psych: Anxious affect, patient feels worked up  EKG:  The ECG that is SR slight St depressions Tele: NSVT was artifact, SR   Laboratory Data:  High Sensitivity Troponin:   Recent Labs  Lab 02/17/22 1227 02/17/22 1423  TROPONINIHS 4 3       Chemistry Recent Labs  Lab 02/17/22 1227  NA 138  K 4.2  CL 103  CO2 27  GLUCOSE 110*  BUN 15  CREATININE 0.73  CALCIUM 8.9  GFRNONAA >60  ANIONGAP 8    Recent Labs  Lab 02/17/22 1227  PROT 6.8  ALBUMIN 3.7  AST 14*  ALT 10  ALKPHOS 45  BILITOT 0.7   Lipids No results for input(s): CHOL, TRIG, HDL, LABVLDL, LDLCALC, CHOLHDL in the last 168 hours. Hematology Recent Labs  Lab 02/17/22 1227  WBC 8.9  RBC 4.03  HGB 11.6*  HCT 37.0  MCV 91.8  MCH 28.8  MCHC 31.4  RDW 13.3  PLT 225   Thyroid No results for input(s): TSH, FREET4 in the last 168 hours. BNPNo results for input(s): BNP, PROBNP in the last 168 hours.  DDimer  Recent Labs  Lab 02/17/22 1227  DDIMER 1.06*     Radiology/Studies:  DG Chest 2 View  Result Date: 02/17/2022 CLINICAL DATA:  Chest pain EXAM: CHEST - 2 VIEW COMPARISON:  10/10/2019 FINDINGS: Cardiac size is within normal limits. There are no signs of pulmonary edema or focal pulmonary consolidation. There is no pleural effusion or pneumothorax. Degenerative changes are noted in both shoulders. IMPRESSION: No active cardiopulmonary disease. Electronically Signed   By: Elmer Picker M.D.   On: 02/17/2022 12:48     Assessment and Plan:   Atypical Chest pain HTN HLD, Aortic Atherosclerosis and RCA CAC Elevated D Dimer with no large PE on CTPE (Full read pending) COVID19 discharge 2020 - troponin 3-> 4 - has had minimal cardiac testing, will get full echo - her perfusion defect is very small, we discussed with [facility] primary, unless significant dvt or change in  clinical status, it may be reason to forgo [facility] - reasonable for [facility] eval; we would have a fairly high bar to offer invasive testing - I worry this is related to stress from her presently living situation; I have discussed with patient, Kelsey Jenny Reichmann, and Primary team, low threshold for case management  For questions or updates, please contact [facility] Please consult  [email or web address] for contact info under     Signed, Werner Lean, MD  02/17/2022 3:49 PM

## 2022-02-18 ENCOUNTER — Telehealth: Payer: Self-pay | Admitting: Pulmonary Disease

## 2022-02-18 ENCOUNTER — Observation Stay (HOSPITAL_BASED_OUTPATIENT_CLINIC_OR_DEPARTMENT_OTHER): Payer: Medicare Other

## 2022-02-18 ENCOUNTER — Observation Stay (HOSPITAL_COMMUNITY): Payer: Medicare Other

## 2022-02-18 DIAGNOSIS — I2699 Other pulmonary embolism without acute cor pulmonale: Secondary | ICD-10-CM

## 2022-02-18 DIAGNOSIS — F419 Anxiety disorder, unspecified: Secondary | ICD-10-CM

## 2022-02-18 DIAGNOSIS — R079 Chest pain, unspecified: Secondary | ICD-10-CM

## 2022-02-18 DIAGNOSIS — Z0389 Encounter for observation for other suspected diseases and conditions ruled out: Secondary | ICD-10-CM | POA: Diagnosis not present

## 2022-02-18 DIAGNOSIS — R0789 Other chest pain: Secondary | ICD-10-CM | POA: Diagnosis not present

## 2022-02-18 DIAGNOSIS — R911 Solitary pulmonary nodule: Secondary | ICD-10-CM

## 2022-02-18 DIAGNOSIS — R918 Other nonspecific abnormal finding of lung field: Secondary | ICD-10-CM

## 2022-02-18 DIAGNOSIS — I480 Paroxysmal atrial fibrillation: Secondary | ICD-10-CM

## 2022-02-18 LAB — CBC WITH DIFFERENTIAL/PLATELET
Abs Immature Granulocytes: 0.05 10*3/uL (ref 0.00–0.07)
Basophils Absolute: 0.1 10*3/uL (ref 0.0–0.1)
Basophils Relative: 1 %
Eosinophils Absolute: 0.1 10*3/uL (ref 0.0–0.5)
Eosinophils Relative: 1 %
HCT: 38.6 % (ref 36.0–46.0)
Hemoglobin: 12.3 g/dL (ref 12.0–15.0)
Immature Granulocytes: 1 %
Lymphocytes Relative: 17 %
Lymphs Abs: 1.5 10*3/uL (ref 0.7–4.0)
MCH: 28.1 pg (ref 26.0–34.0)
MCHC: 31.9 g/dL (ref 30.0–36.0)
MCV: 88.1 fL (ref 80.0–100.0)
Monocytes Absolute: 0.8 10*3/uL (ref 0.1–1.0)
Monocytes Relative: 9 %
Neutro Abs: 6.5 10*3/uL (ref 1.7–7.7)
Neutrophils Relative %: 71 %
Platelets: 233 10*3/uL (ref 150–400)
RBC: 4.38 MIL/uL (ref 3.87–5.11)
RDW: 13.2 % (ref 11.5–15.5)
WBC: 8.9 10*3/uL (ref 4.0–10.5)
nRBC: 0 % (ref 0.0–0.2)

## 2022-02-18 LAB — COMPREHENSIVE METABOLIC PANEL
ALT: 9 U/L (ref 0–44)
AST: 14 U/L — ABNORMAL LOW (ref 15–41)
Albumin: 3.9 g/dL (ref 3.5–5.0)
Alkaline Phosphatase: 45 U/L (ref 38–126)
Anion gap: 9 (ref 5–15)
BUN: 13 mg/dL (ref 8–23)
CO2: 26 mmol/L (ref 22–32)
Calcium: 9.2 mg/dL (ref 8.9–10.3)
Chloride: 103 mmol/L (ref 98–111)
Creatinine, Ser: 0.65 mg/dL (ref 0.44–1.00)
GFR, Estimated: >60 mL/min (ref >60)
Glucose, Bld: 105 mg/dL — ABNORMAL HIGH (ref 70–99)
Potassium: 3.5 mmol/L (ref 3.5–5.1)
Sodium: 138 mmol/L (ref 135–145)
Total Bilirubin: 0.7 mg/dL (ref 0.3–1.2)
Total Protein: 7 g/dL (ref 6.5–8.1)

## 2022-02-18 LAB — ECHOCARDIOGRAM COMPLETE
AR max vel: 2.52 cm2
AV Area VTI: 2.65 cm2
AV Area mean vel: 2.22 cm2
AV Mean grad: 3 mmHg
AV Peak grad: 5.4 mmHg
Ao pk vel: 1.16 m/s
Area-P 1/2: 2.37 cm2
Calc EF: 55.7 %
Height: 62 in
MV VTI: 3.22 cm2
S' Lateral: 2.3 cm
Single Plane A2C EF: 46.1 %
Single Plane A4C EF: 64.3 %
Weight: 2208 oz

## 2022-02-18 LAB — BRAIN NATRIURETIC PEPTIDE: B Natriuretic Peptide: 112 pg/mL — ABNORMAL HIGH (ref 0.0–100.0)

## 2022-02-18 MED ORDER — APIXABAN 5 MG PO TABS
10.0000 mg | ORAL_TABLET | Freq: Two times a day (BID) | ORAL | Status: DC
  Administered 2022-02-18: 10 mg via ORAL
  Filled 2022-02-18: qty 2

## 2022-02-18 MED ORDER — APIXABAN 5 MG PO TABS: 5.0000 mg | ORAL_TABLET | Freq: Two times a day (BID) | ORAL | Status: DC

## 2022-02-18 MED ORDER — APIXABAN 5 MG PO TABS: ORAL_TABLET | ORAL | 0 refills | Status: DC

## 2022-02-18 NOTE — Assessment & Plan Note (Signed)
Peripheral bilateral pulm ground glass and architectural distortion - likely post infectious or inflammatory, mild post covid 19 fibrosis could have this appearance ?Noted, follow outpatient ?

## 2022-02-18 NOTE — Progress Notes (Signed)
?  Transition of Care (TOC) Screening Note ? ? ?Patient Details  ?Name: Kelsey Ruiz ?Date of Birth: 1929/08/18 ? ? ?Transition of Care (TOC) CM/SW Contact:    ?Iona Beard, LCSWA ?Phone Number: ?02/18/2022, 10:32 AM ? ? ? ?Transition of Care Department [facility]) has reviewed patient and no TOC needs have been identified at this time. We will continue to monitor patient advancement through interdisciplinary progression rounds. If new patient transition needs arise, please place a TOC consult. ?  ?

## 2022-02-18 NOTE — Telephone Encounter (Signed)
Please provide routine new consult appt next available in [place]  ?

## 2022-02-18 NOTE — Discharge Instructions (Signed)
Information on my medicine - ELIQUIS (apixaban)  This medication education was reviewed with me or my healthcare representative as part of my discharge preparation.  The pharmacist that spoke with me during my hospital stay was:  Montserrath Madding C Kaelie Henigan, RPH  Why was Eliquis prescribed for you? Eliquis was prescribed to treat blood clots that may have been found in the veins of your legs (deep vein thrombosis) or in your lungs (pulmonary embolism) and to reduce the risk of them occurring again.  What do You need to know about Eliquis ? The starting dose is 10 mg (two 5 mg tablets) taken TWICE daily for the FIRST SEVEN (7) DAYS, then  the dose is reduced to ONE 5 mg tablet taken TWICE daily.  Eliquis may be taken with or without food.   Try to take the dose about the same time in the morning and in the evening. If you have difficulty swallowing the tablet whole please discuss with your pharmacist how to take the medication safely.  Take Eliquis exactly as prescribed and DO NOT stop taking Eliquis without talking to the doctor who prescribed the medication.  Stopping may increase your risk of developing a new blood clot.  Refill your prescription before you run out.  After discharge, you should have regular check-up appointments with your healthcare provider that is prescribing your Eliquis.    What do you do if you miss a dose? If a dose of ELIQUIS is not taken at the scheduled time, take it as soon as possible on the same day and twice-daily administration should be resumed. The dose should not be doubled to make up for a missed dose.  Important Safety Information A possible side effect of Eliquis is bleeding. You should call your healthcare provider right away if you experience any of the following: ? Bleeding from an injury or your nose that does not stop. ? Unusual colored urine (red or dark brown) or unusual colored stools (red or black). ? Unusual bruising for unknown reasons. ? A  serious fall or if you hit your head (even if there is no bleeding).  Some medicines may interact with Eliquis and might increase your risk of bleeding or clotting while on Eliquis. To help avoid this, consult your healthcare provider or pharmacist prior to using any new prescription or non-prescription medications, including herbals, vitamins, non-steroidal anti-inflammatory drugs (NSAIDs) and supplements.  This website has more information on Eliquis (apixaban): http://www.eliquis.com/eliquis/home  

## 2022-02-18 NOTE — Progress Notes (Signed)
*  PRELIMINARY RESULTS* ?Echocardiogram ?2D Echocardiogram has been performed. ? ?Kelsey Ruiz ?02/18/2022, 1:47 PM ?

## 2022-02-18 NOTE — Telephone Encounter (Signed)
Opened 1200 slot on tues April 12th as verbally directed by Dr. Elsworth Soho for patient. Patient scheduled and will print out on AVS. Nothing further needed.  ?

## 2022-02-18 NOTE — Assessment & Plan Note (Signed)
diltiazem

## 2022-02-18 NOTE — Assessment & Plan Note (Signed)
Echo with EF 29-24%, grade I diastolic dysfunction ?Troponin negative x2 ?Cardiology consulted, appreciate assistance - recommending outpatient follow up, ischemic etiology of CP considered unlikely  ?Anxiety could be contributing to chest discomfort? Vs PE (though small volume seems less likely to cause this significant of presentation)? ? ? ?

## 2022-02-18 NOTE — Assessment & Plan Note (Signed)
Sounds like related to living situation, discomfort/disagreements with daughter ?She doesn't want me to consult TOC or APS at this time ?

## 2022-02-18 NOTE — Hospital Course (Signed)
88 yo with hx atrial fibrillation, HTN, dyslipidemia who presented with chest pain which was ongoing for several weeks.  Xanax typically helps her symptoms, feels like someone sitting on her chest.  Her workup has been revealing of small volume PE.  Troponins negative and echo without regional WMA.  Cardiology has low suspicion for ischemic etiology of chest pain.  She was started on anticoagulation in the setting of her pulmonary embolism with plan for outpatient follow up. ? ?See below for additional details ?

## 2022-02-18 NOTE — Assessment & Plan Note (Signed)
Unclear, EKG's on file sinus, documentation in chart not clearly noted ?Follow up with cardiology ?

## 2022-02-18 NOTE — Discharge Summary (Signed)
Physician Discharge Summary  JAE BRUCK VOJ:[id number] DOB: 12-21-1928 DOA: 02/17/2022  PCP: Sharilyn Sites, MD  Admit date: 02/17/2022 Discharge date: 02/18/2022  Time spent: 40 minutes  Recommendations for Outpatient Follow-up:  Follow outpatient CBC/CMP Follow with Dr. Hilma Favors for discussion of long term plan with anticoagulation  Follow with pulmonology for concern for mixed attenuation nodule concerning for adenocarcinoma Follow with cardiology outpatient for chest pain and reported hx atrial fibrillation  Discharge Diagnoses:  Principal Problem:   Chest pain Active Problems:   Acute pulmonary embolism ([place])   Pulmonary nodule   Hypertension   Abnormal CT scan, lung   Hypothyroidism   Concern for atrial fibrillation   Anxiety   Discharge Condition: stable  Diet recommendation: heart healthy  Filed Weights   02/17/22 2028  Weight: 62.6 kg    History of present illness:  88 yo with hx atrial fibrillation, HTN, dyslipidemia who presented with chest pain which was ongoing for several weeks.  Xanax typically helps her symptoms, feels like someone sitting on her chest.  Her workup has been revealing of small volume PE.  Troponins negative and echo without regional WMA.  Cardiology has low suspicion for ischemic etiology of chest pain.  She was started on anticoagulation in the setting of her pulmonary embolism with plan for outpatient follow up.  See below for additional details  Hospital Course:  Assessment and Plan: * Chest pain Echo with EF 99-37%, grade I diastolic dysfunction Troponin negative x2 Cardiology consulted, appreciate assistance - recommending outpatient follow up, ischemic etiology of CP considered unlikely  Anxiety could be contributing to chest discomfort? Vs PE (though small volume seems less likely to cause this significant of presentation)?    Acute pulmonary embolism ([place]) Presented with chest pain Unclear whether this is contributing, CT  noted "tiny filling defects within right lower lobe subsegmental pulmonary artery branches as well as small R pleural effusions and right base airspace disease" Negative LE [place] and echo with normal RVSF Given small volume, considered not anticoagulating, but with presentation with chest pain and CT findings concerning for possible infarct as well, I think treating with eliquis x3 months reasonable.  Discussed bleeding risks.  Follow up with PCP regarding long term duration of anticoagulation  Pulmonary nodule Right lower lobe mixed attenuation nodule concerning for low grade adenocarcinoma Nonspecific 3 mm LLL pulmonary nodule Will place referral to pulmonology at discharge  Abnormal CT scan, lung Peripheral bilateral pulm ground glass and architectural distortion - likely post infectious or inflammatory, mild post covid 19 fibrosis could have this appearance Noted, follow outpatient  Hypertension diltiazem  Concern for atrial fibrillation Unclear, EKG's on file sinus, documentation in chart not clearly noted Follow up with cardiology  Hypothyroidism synthroid  Anxiety Sounds like related to living situation, discomfort/disagreements with daughter She doesn't want me to consult TOC or APS at this time   Procedures: echo  IMPRESSIONS     1. Left ventricular ejection fraction, by estimation, is 65 to 70%. The  left ventricle has normal function. The left ventricle has no regional  wall motion abnormalities. There is mild asymmetric left ventricular  hypertrophy of the basal segment. Left  ventricular diastolic parameters are consistent with Grade I diastolic  dysfunction (impaired relaxation).   2. RV-RA gradient 29 mmHg suggests probable normal RVSP. Right  ventricular systolic function is normal. The right ventricular size is  normal.   3. The mitral valve is degenerative. Trivial mitral valve regurgitation.  Moderate mitral annular calcification.  4. The aortic valve is  tricuspid. Aortic valve regurgitation is not  visualized. Aortic valve sclerosis/calcification is present, without any  evidence of aortic stenosis. Aortic valve mean gradient measures 3.0 mmHg.   5. Unable to estimate CVP.   Comparison(s): No prior Echocardiogram.   LE US IMPRESSION: No evidence of femoropopliteal DVT within either lower extremity.  Consultations: cardiology  Discharge Exam: Vitals:   02/18/22 0526 02/18/22 1404  BP: (!) 149/66 (!) 145/66  Pulse: 68 81  Resp: 15   Temp: 97.7 F (36.5 C) 98.7 F (37.1 C)  SpO2: 98% 97%   Recurrent CP  Daughter and granddaughter at bedside as we discuss anticoagulation question I discussed there need for follow up for pulm nodule/concern for adenocarcinoma later in day (daughters not present - I asked if she wanted me to call her and she said no)  General: No acute distress. Cardiovascular: Heart sounds show Leoni Goodness regular rate, and rhythm.  Lungs: Clear to auscultation bilaterally w Abdomen: Soft, nontender, nondistended  Neurological: Alert and oriented 3. Moves all extremities 4 . Cranial nerves II through XII grossly intact. Skin: Warm and dry. No rashes or lesions. Extremities: No clubbing or cyanosis. No edema. Discharge Instructions   Discharge Instructions     Ambulatory referral to Cardiology   Complete by: As directed    Ambulatory referral to Pulmonology   Complete by: As directed    Reason for referral: Lung Mass/Lung Nodule   Call MD for:  difficulty breathing, headache or visual disturbances   Complete by: As directed    Call MD for:  extreme fatigue   Complete by: As directed    Call MD for:  hives   Complete by: As directed    Call MD for:  persistant dizziness or light-headedness   Complete by: As directed    Call MD for:  persistant nausea and vomiting   Complete by: As directed    Call MD for:  redness, tenderness, or signs of infection (pain, swelling, redness, odor or green/yellow discharge  around incision site)   Complete by: As directed    Call MD for:  severe uncontrolled pain   Complete by: As directed    Call MD for:  temperature >100.4   Complete by: As directed    Diet - low sodium heart healthy   Complete by: As directed    Discharge instructions   Complete by: As directed    You were seen for chest pain.  This could be related to anxiety with the stressful situation at home.  Please continue to follow this up with your PCP.   You had Cortina Vultaggio small blood clot, it's unclear whether this is contributing to your symptoms, though it's difficult to rule this out as Kayler Buckholtz potential cause.  We've decided on anticoagulation for you at this time.  Stop your aspirin and mobic as these medicines will increase your risk of bleeding on eliquis.  Take 10 mg of eliquis twice Itay Mella day for the next 7 days, then transition to 5 mg twice daily.  You should continue your blood thinner for 3 months and then discuss with your PCP whether to continue this (I'd favor discontinuing after 3 months given the small clot burden).  Follow up with cardiology outpatient for your chest pain.  You had Paislyn Domenico pulmonary nodule concerning for low grade adenocarcinoma (lung cancer).  We'll send Eldonna Neuenfeldt referral to the lung doctors to work this up further.  Return for new, recurrent, or worsening symptoms.  Please ask your PCP to request records from this hospitalization so they know what was done and what the next steps will be.   Increase activity slowly   Complete by: As directed       Allergies as of 02/18/2022       Reactions   Sulfa Antibiotics Nausea And Vomiting        Medication List     STOP taking these medications    aspirin EC 81 MG tablet   dexamethasone 4 MG tablet Commonly known as: DECADRON   dexamethasone 6 MG tablet Commonly known as: DECADRON   meloxicam 15 MG tablet Commonly known as: MOBIC       TAKE these medications    Acidophilus 100 MG Caps Take 1 capsule by mouth daily.    alendronate 70 MG tablet Commonly known as: FOSAMAX Take 70 mg by mouth once Lio Wehrly week. Take with Sonnie Bias full glass of water on an empty stomach. Take on Saturday.   ALPRAZolam 0.5 MG tablet Commonly known as: XANAX Take 0.5 mg by mouth 2 (two) times daily as needed.   apixaban 5 MG Tabs tablet Commonly known as: ELIQUIS Take 2 tablets (10 mg total) by mouth 2 (two) times daily for 7 days, THEN 1 tablet (5 mg total) 2 (two) times daily. Start taking on: February 18, 2022   atorvastatin 80 MG tablet Commonly known as: LIPITOR Take 80 mg by mouth daily.   baclofen 10 MG tablet Commonly known as: LIORESAL Take 5 mg by mouth 2 (two) times daily.   benzonatate 200 MG capsule Commonly known as: TESSALON Take 1 capsule (200 mg total) by mouth 3 (three) times daily as needed for cough.   CAL-MAG-ZINC PO Take 1 tablet by mouth every morning.   cholecalciferol 1000 units tablet Commonly known as: VITAMIN D Take 1,000 Units by mouth every morning.   Coenzyme Q10 200 MG capsule Take 200 mg by mouth every morning.   diltiazem 180 MG 24 hr capsule Commonly known as: CARDIZEM CD Take 180 mg by mouth See admin instructions. Take 2 capsules with evening meal   furosemide 40 MG tablet Commonly known as: LASIX Take 40 mg by mouth daily as needed.   gabapentin 100 MG capsule Commonly known as: NEURONTIN Take 100 mg by mouth 2 (two) times daily.   HYDROcodone-acetaminophen 7.5-325 MG tablet Commonly known as: NORCO Take 1 tablet by mouth every 6 (six) hours.   HYDROcodone-homatropine 5-1.5 MG/5ML syrup Commonly known as: HYCODAN Take 5 mLs by mouth every 6 (six) hours as needed.   levothyroxine 100 MCG tablet Commonly known as: SYNTHROID Take 100 mcg by mouth daily.   omeprazole 40 MG capsule Commonly known as: PRILOSEC Take 40 mg by mouth every morning.   ondansetron 4 MG disintegrating tablet Commonly known as: Zofran ODT Take 1 tablet (4 mg total) by mouth every 8 (eight) hours  as needed for nausea or vomiting.   Vitamin B-12 CR 1500 MCG Tbcr Take 1 tablet by mouth daily.   vitamin C 500 MG tablet Commonly known as: ASCORBIC ACID Take 1,000 mg by mouth every morning.       Allergies  Allergen Reactions   Sulfa Antibiotics Nausea And Vomiting      The results of significant diagnostics from this hospitalization (including imaging, microbiology, ancillary and laboratory) are listed below for reference.    Significant Diagnostic Studies: DG Chest 2 View  Result Date: 02/17/2022 CLINICAL DATA:  Chest pain EXAM: CHEST - 2  VIEW COMPARISON:  10/10/2019 FINDINGS: Cardiac size is within normal limits. There are no signs of pulmonary edema or focal pulmonary consolidation. There is no pleural effusion or pneumothorax. Degenerative changes are noted in both shoulders. IMPRESSION: No active cardiopulmonary disease. Electronically Signed   By: Elmer Picker M.D.   On: 02/17/2022 12:48   CT Angio Chest PE W and/or Wo Contrast  Result Date: 02/17/2022 CLINICAL DATA:  Chest pain and atrial fibrillation. Elevated D-dimer. Suspicion of pulmonary embolism. EXAM: CT ANGIOGRAPHY CHEST WITH CONTRAST TECHNIQUE: Multidetector CT imaging of the chest was performed using the standard protocol during bolus administration of intravenous contrast. Multiplanar CT image reconstructions and MIPs were obtained to evaluate the vascular anatomy. RADIATION DOSE REDUCTION: This exam was performed according to the departmental dose-optimization program which includes automated exposure control, adjustment of the mA and/or kV according to patient size and/or use of iterative reconstruction technique. CONTRAST:  2m OMNIPAQUE IOHEXOL 350 MG/ML SOLN COMPARISON:  Plain film earlier today.  11/17/2018 chest CT. FINDINGS: Cardiovascular: The quality of this exam for evaluation of pulmonary embolism is good. Subtle filling defect within subsegmental pulmonary artery branches to the right lower lobe  including on 212/6 and 108/8. Aortic atherosclerosis. Mild cardiomegaly, without pericardial effusion. Pulmonary artery enlargement, outflow tract 3.6 cm Mediastinum/Nodes: Probable thyroidectomy. No mediastinal or hilar adenopathy. Lungs/Pleura: Small right pleural effusion. Mild right base airspace disease. Areas of nonspecific mucoid impaction including the inferior right middle lobe on 82/7. Subtle areas of right greater than left peripheral interstitial opacity with architectural distortion, new since 2019 and likely post infectious/inflammatory in etiology. Superior segment right lower lobe mixed attenuation pulmonary nodule. 1.5 cm ground-glass component with 7 mm solid component on 39/7. At the site of Gilad Dugger 13 mm ground-glass nodule on 11/17/2018. 3 mm left lower lobe pulmonary nodule on 70/7, new. Upper Abdomen: Normal imaged portions of the liver, spleen, stomach, pancreas, adrenal glands, kidneys. Musculoskeletal: Osteopenia. T8 vertebral augmentation for Bennett Vanscyoc severe compression deformity with ventral canal encroachment. Mild superior endplate irregularities including at T2, T6. Accentuated thoracic kyphosis about the T8 level. Review of the MIP images confirms the above findings. IMPRESSION: 1. Tiny filling defects within right lower lobe subsegmental pulmonary artery branches, most consistent with small volume pulmonary emboli. This is of questionable clinical significance. 2. Small right pleural effusion and right base airspace disease which could represent atelectasis or minimal infarct. 3. Enlargement of Chalisa Kobler superior segment right lower lobe now mixed attenuation nodule, highly suspicious for low-grade adenocarcinoma. 4. Peripheral areas of bilateral pulmonary ground-glass and architectural distortion, likely post infectious or inflammatory. Mild post COVID-19 fibrosis could have this appearance. 5. Pulmonary artery enlargement suggests pulmonary arterial hypertension. 6. New nonspecific 3 mm left lower lobe  pulmonary nodule. Electronically Signed   By: KAbigail MiyamotoM.D.   On: 02/17/2022 15:53   [place] Img Lower Bilateral (DVT)  Result Date: 02/18/2022 EXAM: BILATERAL LOWER EXTREMITY VENOUS DOPPLER ULTRASOUND TECHNIQUE: Gray-scale sonography with graded compression, as well as color Doppler and duplex ultrasound were performed to evaluate the lower extremity deep venous systems from the level of the common femoral vein and including the common femoral, femoral, profunda femoral, popliteal and calf veins including the posterior tibial, peroneal and gastrocnemius veins when visible. The superficial great saphenous vein was also interrogated. Spectral Doppler was utilized to evaluate flow at rest and with distal augmentation maneuvers in the common femoral, femoral and popliteal veins. COMPARISON:  None. FINDINGS: Suboptimal evaluation, with poor acoustic penetration secondary to patient habitus.  RIGHT LOWER EXTREMITY VENOUS Normal compressibility of the RIGHT common femoral, superficial femoral, and popliteal veins, as well as the visualized calf veins. Visualized portions of profunda femoral vein and great saphenous vein unremarkable. No filling defects to suggest DVT on grayscale or color Doppler imaging. Doppler waveforms show normal direction of venous flow, normal respiratory plasticity and response to augmentation. OTHER No evidence of superficial thrombophlebitis or abnormal fluid collection. Limitations: none LEFT LOWER EXTREMITY VENOUS Normal compressibility of the LEFT common femoral, superficial femoral, and popliteal veins, as well as the visualized calf veins. Visualized portions of profunda femoral vein and great saphenous vein unremarkable. No filling defects to suggest DVT on grayscale or color Doppler imaging. Doppler waveforms show normal direction of venous flow, normal respiratory plasticity and response to augmentation. OTHER No evidence of superficial thrombophlebitis or abnormal fluid  collection. Limitations: none IMPRESSION: No evidence of femoropopliteal DVT within either lower extremity. Michaelle Birks, MD [facility] Electronically Signed   By: Michaelle Birks M.D.   On: 02/18/2022 12:22   ECHOCARDIOGRAM COMPLETE  Result Date: 02/18/2022    ECHOCARDIOGRAM REPORT   Patient Name:   SERA HITSMAN Date of Exam: 02/18/2022 Medical Rec #:  [id number]         Height:       62.0 in Accession #:    [id number]        Weight:       138.0 lb Date of Birth:  01-22-1929         BSA:          1.633 m Patient Age:    65 years          BP:           149/66 mmHg Patient Gender: F                 HR:           85 bpm. Exam Location:  [facility] Procedure: 2D Echo, Cardiac Doppler and Color Doppler Indications:    Chest Pain  History:        Patient has no prior history of Echocardiogram examinations.                 Signs/Symptoms:Chest Pain; Risk Factors:Hypertension.  Sonographer:    Wenda Low Referring Phys: [place]  1. Left ventricular ejection fraction, by estimation, is 65 to 70%. The left ventricle has normal function. The left ventricle has no regional wall motion abnormalities. There is mild asymmetric left ventricular hypertrophy of the basal segment. Left ventricular diastolic parameters are consistent with Grade I diastolic dysfunction (impaired relaxation).  2. RV-RA gradient 29 mmHg suggests probable normal RVSP. Right ventricular systolic function is normal. The right ventricular size is normal.  3. The mitral valve is degenerative. Trivial mitral valve regurgitation. Moderate mitral annular calcification.  4. The aortic valve is tricuspid. Aortic valve regurgitation is not visualized. Aortic valve sclerosis/calcification is present, without any evidence of aortic stenosis. Aortic valve mean gradient measures 3.0 mmHg.  5. Unable to estimate CVP. Comparison(s): No prior Echocardiogram. FINDINGS  Left  Ventricle: Left ventricular ejection fraction, by estimation, is 65 to 70%. The left ventricle has normal function. The left ventricle has no regional wall motion abnormalities. The left ventricular internal cavity size was normal in size. There is  mild asymmetric left ventricular hypertrophy of the basal segment. Left ventricular diastolic parameters are consistent with Grade I diastolic dysfunction (impaired  relaxation). Right Ventricle: RV-RA gradient 29 mmHg suggests probable normal RVSP. The right ventricular size is normal. No increase in right ventricular wall thickness. Right ventricular systolic function is normal. Left Atrium: Left atrial size was normal in size. Right Atrium: Right atrial size was normal in size. Pericardium: There is no evidence of pericardial effusion. Mitral Valve: The mitral valve is degenerative in appearance. Moderate mitral annular calcification. Trivial mitral valve regurgitation. MV peak gradient, 5.5 mmHg. The mean mitral valve gradient is 2.0 mmHg. Tricuspid Valve: The tricuspid valve is grossly normal. Tricuspid valve regurgitation is trivial. Aortic Valve: The aortic valve is tricuspid. There is mild to moderate aortic valve annular calcification. Aortic valve regurgitation is not visualized. Aortic valve sclerosis/calcification is present, without any evidence of aortic stenosis. Aortic valve mean gradient measures 3.0 mmHg. Aortic valve peak gradient measures 5.4 mmHg. Aortic valve area, by VTI measures 2.65 cm. Pulmonic Valve: The pulmonic valve was grossly normal. Pulmonic valve regurgitation is mild to moderate. Aorta: The aortic root is normal in size and structure. Venous: Unable to estimate CVP. IAS/Shunts: No atrial level shunt detected by color flow Doppler.  LEFT VENTRICLE PLAX 2D LVIDd:         3.70 cm     Diastology LVIDs:         2.30 cm     LV e' medial:    6.74 cm/s LV PW:         1.00 cm     LV E/e' medial:  6.7 LV IVS:        1.20 cm     LV e' lateral:    9.46 cm/s LVOT diam:     1.90 cm     LV E/e' lateral: 4.8 LV SV:         55 LV SV Index:   34 LVOT Area:     2.84 cm  LV Volumes (MOD) LV vol d, MOD A2C: 34.5 ml LV vol d, MOD A4C: 44.0 ml LV vol s, MOD A2C: 18.6 ml LV vol s, MOD A4C: 15.7 ml LV SV MOD A2C:     15.9 ml LV SV MOD A4C:     44.0 ml LV SV MOD BP:      22.2 ml RIGHT VENTRICLE RV Basal diam:  3.70 cm RV Mid diam:    3.70 cm RV S prime:     19.70 cm/s TAPSE (M-mode): 2.3 cm LEFT ATRIUM             Index        RIGHT ATRIUM           Index LA diam:        3.00 cm 1.84 cm/m   RA Area:     10.20 cm LA Vol (A2C):   40.7 ml 24.93 ml/m  RA Volume:   18.30 ml  11.21 ml/m LA Vol (A4C):   35.4 ml 21.68 ml/m LA Biplane Vol: 39.3 ml 24.07 ml/m  AORTIC VALVE                    PULMONIC VALVE AV Area (Vmax):    2.52 cm     PV Vmax:       1.01 m/s AV Area (Vmean):   2.22 cm     PV Peak grad:  4.1 mmHg AV Area (VTI):     2.65 cm AV Vmax:           116.00 cm/s AV Vmean:  76.400 cm/s AV VTI:            0.209 m AV Peak Grad:      5.4 mmHg AV Mean Grad:      3.0 mmHg LVOT Vmax:         103.00 cm/s LVOT Vmean:        59.900 cm/s LVOT VTI:          0.195 m LVOT/AV VTI ratio: 0.93  AORTA Ao Root diam: 3.10 cm Ao Asc diam:  3.00 cm MITRAL VALVE                TRICUSPID VALVE MV Area (PHT): 2.37 cm     TR Peak grad:   28.9 mmHg MV Area VTI:   3.22 cm     TR Vmax:        269.00 cm/s MV Peak grad:  5.5 mmHg MV Mean grad:  2.0 mmHg     SHUNTS MV Vmax:       1.17 m/s     Systemic VTI:  0.20 m MV Vmean:      51.1 cm/s    Systemic Diam: 1.90 cm MV Decel Time: 320 msec MV E velocity: 45.40 cm/s MV Liz Pinho velocity: 104.00 cm/s MV E/Grazia Taffe ratio:  0.44 Rozann Lesches MD Electronically signed by Rozann Lesches MD Signature Date/Time: 02/18/2022/2:39:00 PM    Final     Microbiology: Recent Results (from the past 240 hour(s))  Resp Panel by RT-PCR (Flu Robi Mitter&B, Covid) Nasopharyngeal Swab     Status: None   Collection Time: 02/17/22  3:52 PM   Specimen: Nasopharyngeal Swab;  Nasopharyngeal(NP) swabs in vial transport medium  Result Value Ref Range Status   SARS Coronavirus 2 by RT PCR NEGATIVE NEGATIVE Final    Comment: (NOTE) SARS-CoV-2 target nucleic acids are NOT DETECTED.  The SARS-CoV-2 RNA is generally detectable in upper respiratory specimens during the acute phase of infection. The lowest concentration of SARS-CoV-2 viral copies this assay can detect is 138 copies/mL. Markey Deady negative result does not preclude SARS-Cov-2 infection and should not be used as the sole basis for treatment or other patient management decisions. Ariela Mochizuki negative result may occur with  improper specimen collection/handling, submission of specimen other than nasopharyngeal swab, presence of viral mutation(s) within the areas targeted by this assay, and inadequate number of viral copies(<138 copies/mL). Xavion Muscat negative result must be combined with clinical observations, patient history, and epidemiological information. The expected result is Negative.  Fact Sheet for Patients:  EntrepreneurPulse.com.au  Fact Sheet for Healthcare Providers:  IncredibleEmployment.be  This test is no t yet approved or cleared by the [place] FDA and  has been authorized for detection and/or diagnosis of SARS-CoV-2 by FDA under an Emergency Use Authorization (EUA). This EUA will remain  in effect (meaning this test can be used) for the duration of the COVID-19 declaration under Section 564(b)(1) of the Act, 21 U.S.C.section 360bbb-3(b)(1), unless the authorization is terminated  or revoked sooner.       Influenza Shloime Keilman by PCR NEGATIVE NEGATIVE Final   Influenza B by PCR NEGATIVE NEGATIVE Final    Comment: (NOTE) The Xpert Xpress SARS-CoV-2/FLU/RSV plus assay is intended as an aid in the diagnosis of influenza from Nasopharyngeal swab specimens and should not be used as Rudene Poulsen sole basis for treatment. Nasal washings and aspirates are unacceptable for Xpert Xpress  SARS-CoV-2/FLU/RSV testing.  Fact Sheet for Patients: EntrepreneurPulse.com.au  Fact Sheet for Healthcare Providers: IncredibleEmployment.be  This test is not yet approved or  cleared by the [place] and has been authorized for detection and/or diagnosis of SARS-CoV-2 by FDA under an Emergency Use Authorization (EUA). This EUA will remain in effect (meaning this test can be used) for the duration of the COVID-19 declaration under Section 564(b)(1) of the Act, 21 U.S.C. section 360bbb-3(b)(1), unless the authorization is terminated or revoked.  Performed at [facility], [address], [place], [place] [address]      Labs: Basic Metabolic Panel: Recent Labs  Lab 02/17/22 1227 02/18/22 0851  NA [id number]  K 4.2 3.5  CL 103 103  CO2 27 26  GLUCOSE 110* 105*  BUN 15 13  CREATININE 0.73 0.65  CALCIUM 8.9 9.2   Liver Function Tests: Recent Labs  Lab 02/17/22 1227 02/18/22 0851  AST 14* 14*  ALT 10 9  ALKPHOS 45 45  BILITOT 0.7 0.7  PROT 6.8 7.0  ALBUMIN 3.7 3.9   No results for input(s): LIPASE, AMYLASE in the last 168 hours. No results for input(s): AMMONIA in the last 168 hours. CBC: Recent Labs  Lab 02/17/22 1227 02/18/22 0851  WBC 8.9 8.9  NEUTROABS  --  6.5  HGB 11.6* 12.3  HCT 37.0 38.6  MCV 91.8 88.1  PLT 225 233   Cardiac Enzymes: No results for input(s): CKTOTAL, CKMB, CKMBINDEX, TROPONINI in the last 168 hours. BNP: BNP (last 3 results) Recent Labs    02/18/22 0851  BNP 112.0*    ProBNP (last 3 results) No results for input(s): PROBNP in the last 8760 hours.  CBG: No results for input(s): GLUCAP in the last 168 hours.     Signed:  Fayrene Helper MD.  [facility] 02/18/2022, 4:42 PM

## 2022-02-18 NOTE — Assessment & Plan Note (Addendum)
Right lower lobe mixed attenuation nodule concerning for low grade adenocarcinoma ?Nonspecific 3 mm LLL pulmonary nodule ?Will place referral to pulmonology at discharge ?

## 2022-02-18 NOTE — Assessment & Plan Note (Signed)
synthroid °

## 2022-02-18 NOTE — Progress Notes (Addendum)
Progress Note  Patient Name: Kelsey Ruiz Date of Encounter: 02/18/2022  [facility] Cardiologist: Werner Lean, MD   Subjective   Breathing improved but still having episodes of what seems most consistent with orthopnea. Reports mild chest pressure when lying flat but improves with deep breathing. Occurring at rest and no association with exertion.   Inpatient Medications    Scheduled Meds:  aspirin EC  81 mg Oral Daily   atorvastatin  80 mg Oral Daily   baclofen  5 mg Oral BID   diltiazem  180 mg Oral QPM   enoxaparin (LOVENOX) injection  40 mg Subcutaneous Q24H   levothyroxine  100 mcg Oral QAC breakfast    PRN Meds: acetaminophen **OR** acetaminophen, ALPRAZolam, HYDROcodone-acetaminophen, ondansetron **OR** ondansetron (ZOFRAN) IV, polyethylene glycol   Vital Signs    Vitals:   02/17/22 2028 02/17/22 2030 02/18/22 0208 02/18/22 0526  BP:  138/74 129/60 (!) 149/66  Pulse:  (!) 110 67 68  Resp:  '17 15 15  '$ Temp:  98.3 F (36.8 C) (!) 97.5 F (36.4 C) 97.7 F (36.5 C)  TempSrc:  Oral Oral Oral  SpO2:  98% 94% 98%  Weight: 62.6 kg     Height: '5\' 2"'$  (1.575 m)       Intake/Output Summary (Last 24 hours) at 02/18/2022 1034 Last data filed at 02/17/2022 2101 Gross per 24 hour  Intake 240 ml  Output --  Net 240 ml   Last 3 Weights 02/17/2022 10/10/2019 11/17/2018  Weight (lbs) 138 lb 125 lb 145 lb  Weight (kg) 62.596 kg 56.7 kg 65.772 kg      Telemetry    NSR, HR in 60's to 70's. No significant arrhythmias.  - Personally Reviewed  ECG    NSR, HR 62 with slight TWI along the anterior leads - Personally Reviewed  Physical Exam   GEN: Pleasant elderly female appearing in no acute distress.   Neck: No JVD Cardiac: RRR, no murmurs, rubs, or gallops.  Respiratory: Clear to auscultation bilaterally. GI: Soft, nontender, non-distended  MS: No pitting edema; No deformity. Neuro:  Nonfocal  Psych: Normal affect   Labs    High Sensitivity  Troponin:   Recent Labs  Lab 02/17/22 1227 02/17/22 1423  TROPONINIHS 4 3     Chemistry Recent Labs  Lab 02/17/22 1227 02/18/22 0851  NA 138 138  K 4.2 3.5  CL 103 103  CO2 27 26  GLUCOSE 110* 105*  BUN 15 13  CREATININE 0.73 0.65  CALCIUM 8.9 9.2  PROT 6.8 7.0  ALBUMIN 3.7 3.9  AST 14* 14*  ALT 10 9  ALKPHOS 45 45  BILITOT 0.7 0.7  GFRNONAA >60 >60  ANIONGAP 8 9     Hematology Recent Labs  Lab 02/17/22 1227 02/18/22 0851  WBC 8.9 8.9  RBC 4.03 4.38  HGB 11.6* 12.3  HCT 37.0 38.6  MCV 91.8 88.1  MCH 28.8 28.1  MCHC 31.4 31.9  RDW 13.3 13.2  PLT 225 233    BNP Recent Labs  Lab 02/18/22 0851  BNP 112.0*    DDimer  Recent Labs  Lab 02/17/22 1227  DDIMER 1.06*     Radiology    DG Chest 2 View  Result Date: 02/17/2022 CLINICAL DATA:  Chest pain EXAM: CHEST - 2 VIEW COMPARISON:  10/10/2019 FINDINGS: Cardiac size is within normal limits. There are no signs of pulmonary edema or focal pulmonary consolidation. There is no pleural effusion or pneumothorax. Degenerative changes are  noted in both shoulders. IMPRESSION: No active cardiopulmonary disease. Electronically Signed   By: Elmer Picker M.D.   On: 02/17/2022 12:48   CT Angio Chest PE W and/or Wo Contrast  Result Date: 02/17/2022 CLINICAL DATA:  Chest pain and atrial fibrillation. Elevated D-dimer. Suspicion of pulmonary embolism. EXAM: CT ANGIOGRAPHY CHEST WITH CONTRAST TECHNIQUE: Multidetector CT imaging of the chest was performed using the standard protocol during bolus administration of intravenous contrast. Multiplanar CT image reconstructions and MIPs were obtained to evaluate the vascular anatomy. RADIATION DOSE REDUCTION: This exam was performed according to the departmental dose-optimization program which includes automated exposure control, adjustment of the mA and/or kV according to patient size and/or use of iterative reconstruction technique. CONTRAST:  40m OMNIPAQUE IOHEXOL 350 MG/ML  SOLN COMPARISON:  Plain film earlier today.  11/17/2018 chest CT. FINDINGS: Cardiovascular: The quality of this exam for evaluation of pulmonary embolism is good. Subtle filling defect within subsegmental pulmonary artery branches to the right lower lobe including on 212/6 and 108/8. Aortic atherosclerosis. Mild cardiomegaly, without pericardial effusion. Pulmonary artery enlargement, outflow tract 3.6 cm Mediastinum/Nodes: Probable thyroidectomy. No mediastinal or hilar adenopathy. Lungs/Pleura: Small right pleural effusion. Mild right base airspace disease. Areas of nonspecific mucoid impaction including the inferior right middle lobe on 82/7. Subtle areas of right greater than left peripheral interstitial opacity with architectural distortion, new since 2019 and likely post infectious/inflammatory in etiology. Superior segment right lower lobe mixed attenuation pulmonary nodule. 1.5 cm ground-glass component with 7 mm solid component on 39/7. At the site of a 13 mm ground-glass nodule on 11/17/2018. 3 mm left lower lobe pulmonary nodule on 70/7, new. Upper Abdomen: Normal imaged portions of the liver, spleen, stomach, pancreas, adrenal glands, kidneys. Musculoskeletal: Osteopenia. T8 vertebral augmentation for a severe compression deformity with ventral canal encroachment. Mild superior endplate irregularities including at T2, T6. Accentuated thoracic kyphosis about the T8 level. Review of the MIP images confirms the above findings. IMPRESSION: 1. Tiny filling defects within right lower lobe subsegmental pulmonary artery branches, most consistent with small volume pulmonary emboli. This is of questionable clinical significance. 2. Small right pleural effusion and right base airspace disease which could represent atelectasis or minimal infarct. 3. Enlargement of a superior segment right lower lobe now mixed attenuation nodule, highly suspicious for low-grade adenocarcinoma. 4. Peripheral areas of bilateral  pulmonary ground-glass and architectural distortion, likely post infectious or inflammatory. Mild post COVID-19 fibrosis could have this appearance. 5. Pulmonary artery enlargement suggests pulmonary arterial hypertension. 6. New nonspecific 3 mm left lower lobe pulmonary nodule. Electronically Signed   By: KAbigail MiyamotoM.D.   On: 02/17/2022 15:53    Cardiac Studies   Echocardiogram: Pending  Patient Profile     88y.o. female w/ PMH of HTN, HLD, aortic atherosclerosis, hypothyroidism and GERD who is currently admitted for evaluation of chest pain.   Assessment & Plan    1. Atypical Chest Pain - She presented with intermittent chest pressure for the past several weeks which could occur at rest or with activity and last for 30+ minutes and spontaneously resolve. Episodes are now lasting for a few seconds and resolve with deep inspiration.  -  Hs Troponin values have been negative at 4 and 3. D-dimer was elevated to 1.06 with CTA showing small PE's as outlined above. An echocardiogram is pending to assess for any structural abnormalities. At this time, symptoms seem atypical for ischemia. She does report mild orthopnea and takes Lasix as needed at home. Pending echo  results, may require additional medication adjustments but no significant rales on examination today.   2. PE - CTA on admission showed tiny filling defects within the right lower lobe subsegmental pulmonary artery branches, most consistent with small volume pulmonary emboli. Lower extremity dopplers are negative for DVT. She did report a history of atrial fibrillation but no records of this by review of Epic.  Limited course of anticoagulation planned per hospitalist team, starting on Eliquis per pharmacy.  3. Abnormal CT - CTA also showed enlargement of a superior segment right lower lobe now mixed attenuation nodule which was highly suspicious for low-grade adenocarcinoma. Defer further evaluation to the admitting team. Would likely  benefit from outpatient Pulmonology referral.    For questions or updates, please contact [place] Please consult [email or web address] for contact info under        Signed, Erma Heritage, PA-C  02/18/2022, 10:34 AM      Attending note:  Case discussed with Ms. Ahmed Prima PA-C and also Dr. Florene Glen.  I personally reviewed the patient's echocardiogram which shows vigorous LVEF at 65 to 70%, normal RV contraction and probable normal RVSP based on RV-RA gradient, trivial mitral regurgitation and sclerotic aortic valve without stenosis.  No evidence of ACS and ischemic etiology of chest pain is unlikely.  As discussed above, hospitalist service plans limited course of anticoagulation in case her symptoms are related to small volume pulmonary emboli.  She is being started on Eliquis per pharmacy.  No further inpatient cardiac testing is planned at this point, we will arrange follow-up with Dr. Gasper Sells as requested.  Satira Sark, M.D., F.A.C.C.

## 2022-02-18 NOTE — Assessment & Plan Note (Addendum)
Presented with chest pain ?Unclear whether this is contributing, CT noted "tiny filling defects within right lower lobe subsegmental pulmonary artery branches as well as small R pleural effusions and right base airspace disease" ?Negative LE [place] and echo with normal RVSF ?Given small volume, considered not anticoagulating, but with presentation with chest pain and CT findings concerning for possible infarct as well, I think treating with eliquis x3 months reasonable.  Discussed bleeding risks.  ?Follow up with PCP regarding long term duration of anticoagulation ?

## 2022-02-18 NOTE — Progress Notes (Signed)
ANTICOAGULATION CONSULT NOTE - Initial Consult ? ?Pharmacy Consult for apixaban ?Indication: pulmonary embolus ? ?Allergies  ?Allergen Reactions  ? Sulfa Antibiotics Nausea And Vomiting  ? ? ?Patient Measurements: ?Height: '5\' 2"'$  (157.5 cm) ?Weight: 62.6 kg (138 lb) ?IBW/kg (Calculated) : 50.1 ?Heparin Dosing Weight:  ? ?Vital Signs: ?Temp: 98.7 ?F (37.1 ?C) (03/08 1404) ?Temp Source: Oral (03/08 0526) ?BP: 145/66 (03/08 1404) ?Pulse Rate: 81 (03/08 1404) ? ?Labs: ?Recent Labs  ?  02/17/22 ?1227 02/17/22 ?1423 02/18/22 ?0851  ?HGB 11.6*  --  12.3  ?HCT 37.0  --  38.6  ?PLT 225  --  233  ?CREATININE 0.73  --  0.65  ?TROPONINIHS 4 3  --   ? ? ?Estimated Creatinine Clearance: 40.7 mL/min (by C-G formula based on SCr of 0.65 mg/dL). ? ? ?Medical History: ?Past Medical History:  ?Diagnosis Date  ? Arthritis   ? GERD (gastroesophageal reflux disease)   ? Glaucoma   ? Hx of bladder infections   ? Hyperlipemia   ? Hypertension   ? Hypothyroidism   ? ? ?Medications:  ?Medications Prior to Admission  ?Medication Sig Dispense Refill Last Dose  ? alendronate (FOSAMAX) 70 MG tablet Take 70 mg by mouth once a week. Take with a full glass of water on an empty stomach. Take on Saturday.   02/14/2022  ? ALPRAZolam (XANAX) 0.5 MG tablet Take 0.5 mg by mouth 2 (two) times daily as needed.   02/17/2022  ? aspirin EC 81 MG tablet Take 81 mg by mouth daily.   02/17/2022  ? atorvastatin (LIPITOR) 80 MG tablet Take 80 mg by mouth daily.    02/16/2022  ? baclofen (LIORESAL) 10 MG tablet Take 5 mg by mouth 2 (two) times daily.   02/16/2022  ? benzonatate (TESSALON) 200 MG capsule Take 1 capsule (200 mg total) by mouth 3 (three) times daily as needed for cough. 30 capsule 0 unknown  ? Calcium-Magnesium-Zinc (CAL-MAG-ZINC PO) Take 1 tablet by mouth every morning.    02/16/2022  ? cholecalciferol (VITAMIN D) 1000 UNITS tablet Take 1,000 Units by mouth every morning.    02/16/2022  ? Coenzyme Q10 200 MG capsule Take 200 mg by mouth every morning.    02/16/2022   ? Cyanocobalamin (VITAMIN B-12 CR) 1500 MCG TBCR Take 1 tablet by mouth daily.   02/16/2022  ? diltiazem (CARDIZEM CD) 180 MG 24 hr capsule Take 180 mg by mouth See admin instructions. Take 2 capsules with evening meal   02/16/2022  ? furosemide (LASIX) 40 MG tablet Take 40 mg by mouth daily as needed.   Past Week  ? gabapentin (NEURONTIN) 100 MG capsule Take 100 mg by mouth 2 (two) times daily.   02/17/2022  ? HYDROcodone-acetaminophen (NORCO) 7.5-325 MG tablet Take 1 tablet by mouth every 6 (six) hours.   02/17/2022  ? Lactobacillus (ACIDOPHILUS) 100 MG CAPS Take 1 capsule by mouth daily.   02/16/2022  ? levothyroxine (SYNTHROID) 100 MCG tablet Take 100 mcg by mouth daily.   02/17/2022  ? meloxicam (MOBIC) 15 MG tablet Take 15 mg by mouth daily.   02/16/2022  ? omeprazole (PRILOSEC) 40 MG capsule Take 40 mg by mouth every morning.    02/17/2022  ? ondansetron (ZOFRAN ODT) 4 MG disintegrating tablet Take 1 tablet (4 mg total) by mouth every 8 (eight) hours as needed for nausea or vomiting. 20 tablet 0 unknown  ? vitamin C (ASCORBIC ACID) 500 MG tablet Take 1,000 mg by mouth every morning.  02/16/2022  ? dexamethasone (DECADRON) 4 MG tablet Take 1 tablet (4 mg total) by mouth 2 (two) times daily with a meal. (Patient not taking: Reported on 02/17/2022) 10 tablet 0 Completed Course  ? dexamethasone (DECADRON) 6 MG tablet Take 1 tablet (6 mg total) by mouth daily. (Patient not taking: Reported on 02/17/2022) 5 tablet 0 Completed Course  ? HYDROcodone-homatropine (HYCODAN) 5-1.5 MG/5ML syrup Take 5 mLs by mouth every 6 (six) hours as needed. (Patient not taking: Reported on 11/17/2018) 120 mL 0 Not Taking  ? ? ?Assessment: ?Pharmacy consulted to dose apixaban in patient with PE.  She is not on anticoagulation prior to admission. ? ?CBC WNL ? ?Goal of Therapy:  ? ?Monitor platelets by anticoagulation protocol: Yes ?  ?Plan:  ?Apixaban 10 mg twice daily for 7 days followed by apixaban 5 mg twice daily. ?Monitor H&H and s/s of  bleeding. ? ?Margot Ables, PharmD ?Clinical Pharmacist ?02/18/2022 2:38 PM ? ? ? ?

## 2022-02-18 NOTE — Care Management Obs Status (Signed)
MEDICARE OBSERVATION STATUS NOTIFICATION ? ? ?Patient Details  ?Name: Kelsey Ruiz ?MRN: [id number] ?Date of Birth: [birth year] ? ? ?Medicare Observation Status Notification Given:  Yes ? ? ? ?Tommy Medal ?02/18/2022, 3:17 PM ?

## 2022-02-24 DIAGNOSIS — G894 Chronic pain syndrome: Secondary | ICD-10-CM | POA: Diagnosis not present

## 2022-02-24 DIAGNOSIS — J984 Other disorders of lung: Secondary | ICD-10-CM | POA: Diagnosis not present

## 2022-02-24 DIAGNOSIS — I2699 Other pulmonary embolism without acute cor pulmonale: Secondary | ICD-10-CM | POA: Diagnosis not present

## 2022-02-24 DIAGNOSIS — I4891 Unspecified atrial fibrillation: Secondary | ICD-10-CM | POA: Diagnosis not present

## 2022-02-26 DIAGNOSIS — Z08 Encounter for follow-up examination after completed treatment for malignant neoplasm: Secondary | ICD-10-CM | POA: Diagnosis not present

## 2022-02-26 DIAGNOSIS — C44722 Squamous cell carcinoma of skin of right lower limb, including hip: Secondary | ICD-10-CM | POA: Diagnosis not present

## 2022-02-26 DIAGNOSIS — Z85828 Personal history of other malignant neoplasm of skin: Secondary | ICD-10-CM | POA: Diagnosis not present

## 2022-03-25 ENCOUNTER — Institutional Professional Consult (permissible substitution): Payer: Medicare Other | Admitting: Pulmonary Disease

## 2022-03-26 DIAGNOSIS — C44722 Squamous cell carcinoma of skin of right lower limb, including hip: Secondary | ICD-10-CM | POA: Diagnosis not present

## 2022-03-26 DIAGNOSIS — L821 Other seborrheic keratosis: Secondary | ICD-10-CM | POA: Diagnosis not present

## 2022-03-26 DIAGNOSIS — Z08 Encounter for follow-up examination after completed treatment for malignant neoplasm: Secondary | ICD-10-CM | POA: Diagnosis not present

## 2022-03-26 DIAGNOSIS — Z85828 Personal history of other malignant neoplasm of skin: Secondary | ICD-10-CM | POA: Diagnosis not present

## 2022-03-27 DIAGNOSIS — M1991 Primary osteoarthritis, unspecified site: Secondary | ICD-10-CM | POA: Diagnosis not present

## 2022-03-30 ENCOUNTER — Institutional Professional Consult (permissible substitution): Payer: Medicare Other | Admitting: Internal Medicine

## 2022-04-06 ENCOUNTER — Ambulatory Visit: Payer: Medicare Other | Admitting: Internal Medicine

## 2022-04-27 DIAGNOSIS — G894 Chronic pain syndrome: Secondary | ICD-10-CM | POA: Diagnosis not present

## 2022-04-30 DIAGNOSIS — Z08 Encounter for follow-up examination after completed treatment for malignant neoplasm: Secondary | ICD-10-CM | POA: Diagnosis not present

## 2022-04-30 DIAGNOSIS — Z85828 Personal history of other malignant neoplasm of skin: Secondary | ICD-10-CM | POA: Diagnosis not present

## 2022-05-28 DIAGNOSIS — G894 Chronic pain syndrome: Secondary | ICD-10-CM | POA: Diagnosis not present

## 2022-06-29 DIAGNOSIS — G894 Chronic pain syndrome: Secondary | ICD-10-CM | POA: Diagnosis not present

## 2022-07-30 DIAGNOSIS — G894 Chronic pain syndrome: Secondary | ICD-10-CM | POA: Diagnosis not present

## 2022-07-30 DIAGNOSIS — N390 Urinary tract infection, site not specified: Secondary | ICD-10-CM | POA: Diagnosis not present

## 2022-08-24 DIAGNOSIS — E7849 Other hyperlipidemia: Secondary | ICD-10-CM | POA: Diagnosis not present

## 2022-08-24 DIAGNOSIS — E538 Deficiency of other specified B group vitamins: Secondary | ICD-10-CM | POA: Diagnosis not present

## 2022-08-24 DIAGNOSIS — I10 Essential (primary) hypertension: Secondary | ICD-10-CM | POA: Diagnosis not present

## 2022-08-24 DIAGNOSIS — E039 Hypothyroidism, unspecified: Secondary | ICD-10-CM | POA: Diagnosis not present

## 2022-08-24 DIAGNOSIS — G894 Chronic pain syndrome: Secondary | ICD-10-CM | POA: Diagnosis not present

## 2022-08-24 DIAGNOSIS — I4891 Unspecified atrial fibrillation: Secondary | ICD-10-CM | POA: Diagnosis not present

## 2022-08-24 DIAGNOSIS — E559 Vitamin D deficiency, unspecified: Secondary | ICD-10-CM | POA: Diagnosis not present

## 2022-08-24 DIAGNOSIS — N390 Urinary tract infection, site not specified: Secondary | ICD-10-CM | POA: Diagnosis not present

## 2022-08-24 DIAGNOSIS — M1991 Primary osteoarthritis, unspecified site: Secondary | ICD-10-CM | POA: Diagnosis not present

## 2022-08-24 DIAGNOSIS — Z23 Encounter for immunization: Secondary | ICD-10-CM | POA: Diagnosis not present

## 2022-08-24 DIAGNOSIS — E785 Hyperlipidemia, unspecified: Secondary | ICD-10-CM | POA: Diagnosis not present

## 2022-09-14 DIAGNOSIS — C44722 Squamous cell carcinoma of skin of right lower limb, including hip: Secondary | ICD-10-CM | POA: Diagnosis not present

## 2022-09-14 DIAGNOSIS — Z08 Encounter for follow-up examination after completed treatment for malignant neoplasm: Secondary | ICD-10-CM | POA: Diagnosis not present

## 2022-09-14 DIAGNOSIS — L821 Other seborrheic keratosis: Secondary | ICD-10-CM | POA: Diagnosis not present

## 2022-09-14 DIAGNOSIS — C44612 Basal cell carcinoma of skin of right upper limb, including shoulder: Secondary | ICD-10-CM | POA: Diagnosis not present

## 2022-09-14 DIAGNOSIS — L820 Inflamed seborrheic keratosis: Secondary | ICD-10-CM | POA: Diagnosis not present

## 2022-09-14 DIAGNOSIS — Z85828 Personal history of other malignant neoplasm of skin: Secondary | ICD-10-CM | POA: Diagnosis not present

## 2022-09-23 DIAGNOSIS — G894 Chronic pain syndrome: Secondary | ICD-10-CM | POA: Diagnosis not present

## 2022-10-13 DIAGNOSIS — H401131 Primary open-angle glaucoma, bilateral, mild stage: Secondary | ICD-10-CM | POA: Diagnosis not present

## 2022-10-19 DIAGNOSIS — D485 Neoplasm of uncertain behavior of skin: Secondary | ICD-10-CM | POA: Diagnosis not present

## 2022-10-19 DIAGNOSIS — Z08 Encounter for follow-up examination after completed treatment for malignant neoplasm: Secondary | ICD-10-CM | POA: Diagnosis not present

## 2022-10-19 DIAGNOSIS — L910 Hypertrophic scar: Secondary | ICD-10-CM | POA: Diagnosis not present

## 2022-10-19 DIAGNOSIS — Z85828 Personal history of other malignant neoplasm of skin: Secondary | ICD-10-CM | POA: Diagnosis not present

## 2022-10-19 DIAGNOSIS — L570 Actinic keratosis: Secondary | ICD-10-CM | POA: Diagnosis not present

## 2022-10-19 DIAGNOSIS — X32XXXD Exposure to sunlight, subsequent encounter: Secondary | ICD-10-CM | POA: Diagnosis not present

## 2022-10-23 DIAGNOSIS — G894 Chronic pain syndrome: Secondary | ICD-10-CM | POA: Diagnosis not present

## 2022-11-26 DIAGNOSIS — E559 Vitamin D deficiency, unspecified: Secondary | ICD-10-CM | POA: Diagnosis not present

## 2022-11-26 DIAGNOSIS — E119 Type 2 diabetes mellitus without complications: Secondary | ICD-10-CM | POA: Diagnosis not present

## 2022-11-26 DIAGNOSIS — E538 Deficiency of other specified B group vitamins: Secondary | ICD-10-CM | POA: Diagnosis not present

## 2022-11-26 DIAGNOSIS — G894 Chronic pain syndrome: Secondary | ICD-10-CM | POA: Diagnosis not present

## 2022-11-26 DIAGNOSIS — J984 Other disorders of lung: Secondary | ICD-10-CM | POA: Diagnosis not present

## 2022-11-26 DIAGNOSIS — I4891 Unspecified atrial fibrillation: Secondary | ICD-10-CM | POA: Diagnosis not present

## 2022-11-26 DIAGNOSIS — E039 Hypothyroidism, unspecified: Secondary | ICD-10-CM | POA: Diagnosis not present

## 2022-11-26 DIAGNOSIS — M1991 Primary osteoarthritis, unspecified site: Secondary | ICD-10-CM | POA: Diagnosis not present

## 2022-12-25 DIAGNOSIS — G894 Chronic pain syndrome: Secondary | ICD-10-CM | POA: Diagnosis not present

## 2023-01-25 DIAGNOSIS — G894 Chronic pain syndrome: Secondary | ICD-10-CM | POA: Diagnosis not present

## 2023-02-23 DIAGNOSIS — I4891 Unspecified atrial fibrillation: Secondary | ICD-10-CM | POA: Diagnosis not present

## 2023-02-23 DIAGNOSIS — Z0001 Encounter for general adult medical examination with abnormal findings: Secondary | ICD-10-CM | POA: Diagnosis not present

## 2023-02-23 DIAGNOSIS — B354 Tinea corporis: Secondary | ICD-10-CM | POA: Diagnosis not present

## 2023-02-23 DIAGNOSIS — G894 Chronic pain syndrome: Secondary | ICD-10-CM | POA: Diagnosis not present

## 2023-02-23 DIAGNOSIS — E119 Type 2 diabetes mellitus without complications: Secondary | ICD-10-CM | POA: Diagnosis not present

## 2023-03-22 DIAGNOSIS — Z85828 Personal history of other malignant neoplasm of skin: Secondary | ICD-10-CM | POA: Diagnosis not present

## 2023-03-22 DIAGNOSIS — C44712 Basal cell carcinoma of skin of right lower limb, including hip: Secondary | ICD-10-CM | POA: Diagnosis not present

## 2023-03-22 DIAGNOSIS — Z08 Encounter for follow-up examination after completed treatment for malignant neoplasm: Secondary | ICD-10-CM | POA: Diagnosis not present

## 2023-03-22 DIAGNOSIS — L820 Inflamed seborrheic keratosis: Secondary | ICD-10-CM | POA: Diagnosis not present

## 2023-03-23 DIAGNOSIS — G894 Chronic pain syndrome: Secondary | ICD-10-CM | POA: Diagnosis not present

## 2023-04-23 DIAGNOSIS — G894 Chronic pain syndrome: Secondary | ICD-10-CM | POA: Diagnosis not present

## 2023-05-24 DIAGNOSIS — E782 Mixed hyperlipidemia: Secondary | ICD-10-CM | POA: Diagnosis not present

## 2023-05-24 DIAGNOSIS — E119 Type 2 diabetes mellitus without complications: Secondary | ICD-10-CM | POA: Diagnosis not present

## 2023-05-24 DIAGNOSIS — R7303 Prediabetes: Secondary | ICD-10-CM | POA: Diagnosis not present

## 2023-05-24 DIAGNOSIS — E559 Vitamin D deficiency, unspecified: Secondary | ICD-10-CM | POA: Diagnosis not present

## 2023-05-24 DIAGNOSIS — E538 Deficiency of other specified B group vitamins: Secondary | ICD-10-CM | POA: Diagnosis not present

## 2023-05-24 DIAGNOSIS — G894 Chronic pain syndrome: Secondary | ICD-10-CM | POA: Diagnosis not present

## 2023-05-24 DIAGNOSIS — I10 Essential (primary) hypertension: Secondary | ICD-10-CM | POA: Diagnosis not present

## 2023-05-24 DIAGNOSIS — M810 Age-related osteoporosis without current pathological fracture: Secondary | ICD-10-CM | POA: Diagnosis not present

## 2023-05-24 DIAGNOSIS — I4891 Unspecified atrial fibrillation: Secondary | ICD-10-CM | POA: Diagnosis not present

## 2023-05-24 DIAGNOSIS — E039 Hypothyroidism, unspecified: Secondary | ICD-10-CM | POA: Diagnosis not present

## 2023-05-24 DIAGNOSIS — M1991 Primary osteoarthritis, unspecified site: Secondary | ICD-10-CM | POA: Diagnosis not present

## 2023-06-10 DIAGNOSIS — Z85828 Personal history of other malignant neoplasm of skin: Secondary | ICD-10-CM | POA: Diagnosis not present

## 2023-06-10 DIAGNOSIS — Z08 Encounter for follow-up examination after completed treatment for malignant neoplasm: Secondary | ICD-10-CM | POA: Diagnosis not present

## 2023-06-10 DIAGNOSIS — C44719 Basal cell carcinoma of skin of left lower limb, including hip: Secondary | ICD-10-CM | POA: Diagnosis not present

## 2023-06-10 DIAGNOSIS — S90462A Insect bite (nonvenomous), left great toe, initial encounter: Secondary | ICD-10-CM | POA: Diagnosis not present

## 2023-06-16 DIAGNOSIS — G894 Chronic pain syndrome: Secondary | ICD-10-CM | POA: Diagnosis not present

## 2023-07-15 DIAGNOSIS — Z08 Encounter for follow-up examination after completed treatment for malignant neoplasm: Secondary | ICD-10-CM | POA: Diagnosis not present

## 2023-07-15 DIAGNOSIS — Z85828 Personal history of other malignant neoplasm of skin: Secondary | ICD-10-CM | POA: Diagnosis not present

## 2023-07-26 DIAGNOSIS — G894 Chronic pain syndrome: Secondary | ICD-10-CM | POA: Diagnosis not present

## 2023-07-31 DIAGNOSIS — N390 Urinary tract infection, site not specified: Secondary | ICD-10-CM | POA: Diagnosis not present

## 2023-08-11 DIAGNOSIS — N390 Urinary tract infection, site not specified: Secondary | ICD-10-CM | POA: Diagnosis not present

## 2023-08-15 ENCOUNTER — Emergency Department (HOSPITAL_COMMUNITY): Payer: Medicare Other

## 2023-08-15 ENCOUNTER — Inpatient Hospital Stay (HOSPITAL_COMMUNITY)
Admission: EM | Admit: 2023-08-15 | Discharge: 2023-08-17 | DRG: 178 | Disposition: A | Discharge status: Home / Self Care | Payer: Medicare Other | Attending: Family Medicine | Admitting: Family Medicine

## 2023-08-15 ENCOUNTER — Other Ambulatory Visit: Payer: Self-pay

## 2023-08-15 DIAGNOSIS — R531 Weakness: Secondary | ICD-10-CM | POA: Diagnosis not present

## 2023-08-15 DIAGNOSIS — I10 Essential (primary) hypertension: Secondary | ICD-10-CM | POA: Diagnosis not present

## 2023-08-15 DIAGNOSIS — I482 Chronic atrial fibrillation, unspecified: Secondary | ICD-10-CM | POA: Diagnosis not present

## 2023-08-15 DIAGNOSIS — Z79899 Other long term (current) drug therapy: Secondary | ICD-10-CM | POA: Diagnosis not present

## 2023-08-15 DIAGNOSIS — R1110 Vomiting, unspecified: Secondary | ICD-10-CM | POA: Insufficient documentation

## 2023-08-15 DIAGNOSIS — E876 Hypokalemia: Secondary | ICD-10-CM | POA: Diagnosis not present

## 2023-08-15 DIAGNOSIS — Z7983 Long term (current) use of bisphosphonates: Secondary | ICD-10-CM | POA: Diagnosis not present

## 2023-08-15 DIAGNOSIS — Z882 Allergy status to sulfonamides status: Secondary | ICD-10-CM | POA: Diagnosis not present

## 2023-08-15 DIAGNOSIS — E782 Mixed hyperlipidemia: Secondary | ICD-10-CM | POA: Insufficient documentation

## 2023-08-15 DIAGNOSIS — U071 COVID-19: Principal | ICD-10-CM | POA: Diagnosis present

## 2023-08-15 DIAGNOSIS — R197 Diarrhea, unspecified: Secondary | ICD-10-CM | POA: Insufficient documentation

## 2023-08-15 DIAGNOSIS — R0989 Other specified symptoms and signs involving the circulatory and respiratory systems: Secondary | ICD-10-CM | POA: Diagnosis not present

## 2023-08-15 DIAGNOSIS — Z79890 Hormone replacement therapy (postmenopausal): Secondary | ICD-10-CM

## 2023-08-15 DIAGNOSIS — Z7901 Long term (current) use of anticoagulants: Secondary | ICD-10-CM

## 2023-08-15 DIAGNOSIS — K219 Gastro-esophageal reflux disease without esophagitis: Secondary | ICD-10-CM | POA: Insufficient documentation

## 2023-08-15 DIAGNOSIS — E890 Postprocedural hypothyroidism: Secondary | ICD-10-CM | POA: Diagnosis not present

## 2023-08-15 DIAGNOSIS — Z90710 Acquired absence of both cervix and uterus: Secondary | ICD-10-CM

## 2023-08-15 DIAGNOSIS — E860 Dehydration: Secondary | ICD-10-CM | POA: Insufficient documentation

## 2023-08-15 DIAGNOSIS — Z743 Need for continuous supervision: Secondary | ICD-10-CM | POA: Diagnosis not present

## 2023-08-15 DIAGNOSIS — Z87891 Personal history of nicotine dependence: Secondary | ICD-10-CM | POA: Diagnosis not present

## 2023-08-15 DIAGNOSIS — R000 Tachycardia, unspecified: Secondary | ICD-10-CM | POA: Diagnosis not present

## 2023-08-15 LAB — URINALYSIS, ROUTINE W REFLEX MICROSCOPIC
Bilirubin Urine: NEGATIVE
Glucose, UA: NEGATIVE mg/dL
Hgb urine dipstick: NEGATIVE
Ketones, ur: NEGATIVE mg/dL
Leukocytes,Ua: NEGATIVE
Nitrite: NEGATIVE
Protein, ur: NEGATIVE mg/dL
Specific Gravity, Urine: 1.005 (ref 1.005–1.030)
pH: 8 (ref 5.0–8.0)

## 2023-08-15 LAB — BLOOD GAS, VENOUS
Acid-Base Excess: 7.2 mmol/L — ABNORMAL HIGH (ref 0.0–2.0)
Bicarbonate: 31.2 mmol/L — ABNORMAL HIGH (ref 20.0–28.0)
Drawn by: 7049
O2 Saturation: 87.1 %
Patient temperature: 38.2
pCO2, Ven: 43 mmHg — ABNORMAL LOW (ref 44–60)
pH, Ven: 7.47 — ABNORMAL HIGH (ref 7.25–7.43)
pO2, Ven: 52 mmHg — ABNORMAL HIGH (ref 32–45)

## 2023-08-15 LAB — HEPATIC FUNCTION PANEL
ALT: 14 U/L (ref 0–44)
AST: 19 U/L (ref 15–41)
Albumin: 3.9 g/dL (ref 3.5–5.0)
Alkaline Phosphatase: 46 U/L (ref 38–126)
Bilirubin, Direct: 0.1 mg/dL (ref 0.0–0.2)
Indirect Bilirubin: 0.7 mg/dL (ref 0.3–0.9)
Total Bilirubin: 0.8 mg/dL (ref 0.3–1.2)
Total Protein: 6.5 g/dL (ref 6.5–8.1)

## 2023-08-15 LAB — BASIC METABOLIC PANEL
Anion gap: 11 (ref 5–15)
BUN: 6 mg/dL — ABNORMAL LOW (ref 8–23)
CO2: 22 mmol/L (ref 22–32)
Calcium: 8.2 mg/dL — ABNORMAL LOW (ref 8.9–10.3)
Chloride: 100 mmol/L (ref 98–111)
Creatinine, Ser: 0.67 mg/dL (ref 0.44–1.00)
GFR, Estimated: >60 mL/min (ref >60)
Glucose, Bld: 138 mg/dL — ABNORMAL HIGH (ref 70–99)
Potassium: 3.3 mmol/L — ABNORMAL LOW (ref 3.5–5.1)
Sodium: 133 mmol/L — ABNORMAL LOW (ref 135–145)

## 2023-08-15 LAB — CBC
HCT: 36.9 % (ref 36.0–46.0)
Hemoglobin: 11.7 g/dL — ABNORMAL LOW (ref 12.0–15.0)
MCH: 28.4 pg (ref 26.0–34.0)
MCHC: 31.7 g/dL (ref 30.0–36.0)
MCV: 89.6 fL (ref 80.0–100.0)
Platelets: 221 10*3/uL (ref 150–400)
RBC: 4.12 MIL/uL (ref 3.87–5.11)
RDW: 13.9 % (ref 11.5–15.5)
WBC: 10.4 10*3/uL (ref 4.0–10.5)
nRBC: 0 % (ref 0.0–0.2)

## 2023-08-15 LAB — CBG MONITORING, ED: Glucose-Capillary: 145 mg/dL — ABNORMAL HIGH (ref 70–99)

## 2023-08-15 LAB — TSH: TSH: 0.376 u[IU]/mL (ref 0.350–4.500)

## 2023-08-15 LAB — SARS CORONAVIRUS 2 BY RT PCR: SARS Coronavirus 2 by RT PCR: POSITIVE — AB

## 2023-08-15 MED ORDER — ACETAMINOPHEN 325 MG PO TABS
650.0000 mg | ORAL_TABLET | Freq: Once | ORAL | Status: AC
  Administered 2023-08-15: 650 mg via ORAL
  Filled 2023-08-15: qty 2

## 2023-08-15 MED ORDER — SODIUM CHLORIDE 0.9 % IV BOLUS
500.0000 mL | Freq: Once | INTRAVENOUS | Status: AC
  Administered 2023-08-15: 500 mL via INTRAVENOUS

## 2023-08-15 MED ORDER — SODIUM CHLORIDE 0.9 % IV SOLN: INTRAVENOUS | Status: DC

## 2023-08-15 NOTE — ED Notes (Signed)
Family member states pt took a xanax at 2pm. Pt presents with lethargy, eye opening to voice.

## 2023-08-15 NOTE — ED Triage Notes (Signed)
Pt from home via [facility], at home test postive for covid. EMS states family was assisting pt to toilet when she became too weak to stand. Did not fall, was assisted to sitting position on floor and EMS called. Pt c/o neck pain so EMS placed pt in c-collar.

## 2023-08-15 NOTE — ED Notes (Signed)
Pt was changed into a gown, placed in a brief. Pt is resting with call light in reach. Family is now at bedside.

## 2023-08-15 NOTE — ED Provider Notes (Signed)
Case is discussed with Dr. Thomes Dinning of [facility], who agrees to admit the patient.   Dione Booze, MD 08/15/23 [phone]

## 2023-08-15 NOTE — ED Notes (Signed)
Pt family member states other daughter tested positive for covid Thursday and gave pt one dose of paxlovid after positive at home test today.

## 2023-08-15 NOTE — ED Provider Notes (Addendum)
[place] EMERGENCY DEPARTMENT AT [facility] Provider Note   CSN: [id number] Arrival date & time: 08/15/23  1928     History  Chief Complaint  Patient presents with   covid +   Weakness    Kelsey PULSIPHER is a 89 y.o. female.  Patient with some mild symptoms the past 2 days today became very fatigued had several episodes of vomiting and diarrhea with no blood.  Home COVID test was positive.  Patient had bedside commode and after having vomiting and diarrhea got too weak to stand.  Family had to assist her to the floor.  EMS called.  Here patient able to raise left leg and right leg off the mattress cannot hold them up for very long.  Can hold both arms out in front of her for a good period of time.  There was some question about speech being off.  Patient just appears very somnolent.  Patient is following commands.  Past medical history significant for the patient not being on any blood thinners.  Completed some in April 2023.  Past medical history hypertension hypothyroidism hyperlipidemia past surgical history significant for abdominal hysterectomy thyroidectomy.  Patient is a former smoker quit in 1985.       Home Medications Prior to Admission medications   Medication Sig Start Date End Date Taking? Authorizing Provider  alendronate (FOSAMAX) 70 MG tablet Take 70 mg by mouth once a week. Take with a full glass of water on an empty stomach. Take on Saturday.    [provider]  ALPRAZolam Prudy Feeler) 0.5 MG tablet Take 0.5 mg by mouth 2 (two) times daily as needed. 12/09/21   [provider]  apixaban (ELIQUIS) 5 MG TABS tablet Take 2 tablets (10 mg total) by mouth 2 (two) times daily for 7 days, THEN 1 tablet (5 mg total) 2 (two) times daily. 02/18/22 03/27/22  Zigmund Daniel., MD  atorvastatin (LIPITOR) 80 MG tablet Take 80 mg by mouth daily.  08/17/18   [provider]  baclofen (LIORESAL) 10 MG tablet Take 5 mg by mouth 2 (two) times daily.  01/14/22   [provider]  benzonatate (TESSALON) 200 MG capsule Take 1 capsule (200 mg total) by mouth 3 (three) times daily as needed for cough. 10/16/19   Leatha Gilding, MD  Calcium-Magnesium-Zinc (CAL-MAG-ZINC PO) Take 1 tablet by mouth every morning.     [provider]  cholecalciferol (VITAMIN D) 1000 UNITS tablet Take 1,000 Units by mouth every morning.     [provider]  Coenzyme Q10 200 MG capsule Take 200 mg by mouth every morning.     [provider]  Cyanocobalamin (VITAMIN B-12 CR) 1500 MCG TBCR Take 1 tablet by mouth daily.    [provider]  diltiazem (CARDIZEM CD) 180 MG 24 hr capsule Take 180 mg by mouth See admin instructions. Take 2 capsules with evening meal 08/17/19   [provider]  furosemide (LASIX) 40 MG tablet Take 40 mg by mouth daily as needed. 08/25/19   [provider]  gabapentin (NEURONTIN) 100 MG capsule Take 100 mg by mouth 2 (two) times daily. 08/25/19   [provider]  HYDROcodone-acetaminophen (NORCO) 7.5-325 MG tablet Take 1 tablet by mouth every 6 (six) hours. 01/29/22   [provider]  HYDROcodone-homatropine (HYCODAN) 5-1.5 MG/5ML syrup Take 5 mLs by mouth every 6 (six) hours as needed. Patient not taking: Reported on 11/17/2018 09/17/18   Ivery Quale, PA-C  Lactobacillus (ACIDOPHILUS) 100 MG CAPS Take 1 capsule by mouth daily.    [provider]  levothyroxine (SYNTHROID) 100 MCG tablet Take 100 mcg by mouth daily. 12/30/21   [provider]  omeprazole (PRILOSEC) 40 MG capsule Take 40 mg by mouth every morning.     [provider]  ondansetron (ZOFRAN ODT) 4 MG disintegrating tablet Take 1 tablet (4 mg total) by mouth every 8 (eight) hours as needed for nausea or vomiting. 11/17/18   Burgess Amor, PA-C  vitamin C (ASCORBIC ACID) 500 MG tablet Take 1,000 mg by mouth every morning.     [provider]      Allergies    Sulfa antibiotics     Review of Systems   Review of Systems  Unable to perform ROS: Mental status change  Gastrointestinal:  Positive for diarrhea and vomiting.  Neurological:  Positive for weakness.    Physical Exam Updated Vital Signs BP (!) 146/74   Pulse 100   Temp (!) 100.9 F (38.3 C) (Rectal)   Resp (!) 21   Ht 1.575 m (5\' 2" )   Wt 62.6 kg   SpO2 95%   BMI 25.24 kg/m  Physical Exam Vitals and nursing note reviewed.  Constitutional:      General: She is not in acute distress.    Appearance: Normal appearance. She is well-developed. She is ill-appearing.  HENT:     Head: Normocephalic and atraumatic.     Mouth/Throat:     Mouth: Mucous membranes are dry.  Eyes:     Extraocular Movements: Extraocular movements intact.     Conjunctiva/sclera: Conjunctivae normal.     Pupils: Pupils are equal, round, and reactive to light.  Cardiovascular:     Rate and Rhythm: Normal rate and regular rhythm.     Heart sounds: No murmur heard. Pulmonary:     Effort: Pulmonary effort is normal. No respiratory distress.     Breath sounds: Normal breath sounds. No wheezing, rhonchi or rales.  Abdominal:     General: There is no distension.     Palpations: Abdomen is soft.     Tenderness: There is no abdominal tenderness. There is no guarding.  Musculoskeletal:        General: No swelling.     Cervical back: Neck supple. No rigidity.  Skin:    General: Skin is warm and dry.     Capillary Refill: Capillary refill takes less than 2 seconds.  Neurological:     Mental Status: She is alert.     Comments: Generalized weakness to both lower extremities and upper extremities.  But has the ability to raise her arms up and raise both legs up.  Just cannot hold them up for long period  Psychiatric:        Mood and Affect: Mood normal.     ED Results / Procedures / Treatments   Labs (all labs ordered are listed, but only abnormal results are displayed) Labs Reviewed  SARS CORONAVIRUS 2 BY RT PCR -  Abnormal; Notable for the following components:      Result Value   SARS Coronavirus 2 by RT PCR POSITIVE (*)    All other components within normal limits  BASIC METABOLIC PANEL - Abnormal; Notable for the following components:   Sodium 133 (*)    Potassium 3.3 (*)    Glucose, Bld 138 (*)    BUN 6 (*)    Calcium 8.2 (*)    All other components within  normal limits  CBC - Abnormal; Notable for the following components:   Hemoglobin 11.7 (*)    All other components within normal limits  CBG MONITORING, ED - Abnormal; Notable for the following components:   Glucose-Capillary 145 (*)    All other components within normal limits  URINALYSIS, ROUTINE W REFLEX MICROSCOPIC  HEPATIC FUNCTION PANEL  BLOOD GAS, VENOUS    EKG EKG Interpretation Date/Time:  Sunday August 15 2023 19:33:57 EDT Ventricular Rate:  101 PR Interval:  176 QRS Duration:  97 QT Interval:  355 QTC Calculation: 461 R Axis:   60  Text Interpretation: Sinus tachycardia Borderline T abnormalities, diffuse leads Confirmed by Vanetta Mulders [phone]) on 08/15/2023 9:24:30 PM  Radiology No results found.  Procedures Procedures    Medications Ordered in ED Medications  acetaminophen (TYLENOL) tablet 650 mg (has no administration in time range)  sodium chloride 0.9 % bolus 500 mL (has no administration in time range)  0.9 %  sodium chloride infusion (has no administration in time range)    ED Course/ Medical Decision Making/ A&P                                 Medical Decision Making Amount and/or Complexity of Data Reviewed Labs: ordered. Radiology: ordered.  Risk OTC drugs. Prescription drug management. Decision regarding hospitalization.  Patient appears significantly dehydrated.  Patient will little bit of mental confusion.  But will follow commands.  Is able to raise the left leg right leg off the mattress.  Hold arms up for a good period of time.  No obvious speech abnormality.  The patient very  somnolent.  Will check venous blood gas.  COVID test positive glucose 145 basic metabolic panel sodium 133 potassium down a little bit 3.3.  And renal function GFR greater than 60.  CBC no leukocytosis hemoglobin 11.7 platelets 221.  Patient is never had COVID before.  Patient will need IV fluids.  Will get chest x-ray will get head CT will most likely require admission not able to go home due to to the significance and frequency of the vomiting and diarrhea today.  No blood in that.  He had pending.  But patient's had it done.  Urinalysis negative venous blood gas with a reassuring pH 7.47 the pCO2 43 so not significantly elevated.  Chest x-ray without any acute findings.  CT head without any acute findings.  Patient improving with some IV fluids.  Mentation is improving as well.  Final Clinical Impression(s) / ED Diagnoses Final diagnoses:  COVID    Rx / DC Orders ED Discharge Orders     None         Vanetta Mulders, MD 08/15/23 2130    Vanetta Mulders, MD 08/15/23 6045    Vanetta Mulders, MD 08/15/23 2332    Vanetta Mulders, MD 08/15/23 [phone]

## 2023-08-16 ENCOUNTER — Encounter (HOSPITAL_COMMUNITY): Payer: Self-pay | Admitting: Internal Medicine

## 2023-08-16 DIAGNOSIS — I482 Chronic atrial fibrillation, unspecified: Secondary | ICD-10-CM

## 2023-08-16 DIAGNOSIS — R1110 Vomiting, unspecified: Secondary | ICD-10-CM | POA: Insufficient documentation

## 2023-08-16 DIAGNOSIS — R197 Diarrhea, unspecified: Secondary | ICD-10-CM | POA: Insufficient documentation

## 2023-08-16 DIAGNOSIS — I10 Essential (primary) hypertension: Secondary | ICD-10-CM

## 2023-08-16 DIAGNOSIS — E860 Dehydration: Secondary | ICD-10-CM | POA: Diagnosis not present

## 2023-08-16 DIAGNOSIS — E782 Mixed hyperlipidemia: Secondary | ICD-10-CM

## 2023-08-16 DIAGNOSIS — K219 Gastro-esophageal reflux disease without esophagitis: Secondary | ICD-10-CM

## 2023-08-16 DIAGNOSIS — E876 Hypokalemia: Secondary | ICD-10-CM

## 2023-08-16 DIAGNOSIS — R531 Weakness: Secondary | ICD-10-CM | POA: Diagnosis not present

## 2023-08-16 DIAGNOSIS — U071 COVID-19: Secondary | ICD-10-CM | POA: Diagnosis not present

## 2023-08-16 LAB — CBC
HCT: 40.6 % (ref 36.0–46.0)
Hemoglobin: 12.4 g/dL (ref 12.0–15.0)
MCH: 28.1 pg (ref 26.0–34.0)
MCHC: 30.5 g/dL (ref 30.0–36.0)
MCV: 91.9 fL (ref 80.0–100.0)
Platelets: 233 10*3/uL (ref 150–400)
RBC: 4.42 MIL/uL (ref 3.87–5.11)
RDW: 13.7 % (ref 11.5–15.5)
WBC: 8.7 10*3/uL (ref 4.0–10.5)
nRBC: 0 % (ref 0.0–0.2)

## 2023-08-16 LAB — COMPREHENSIVE METABOLIC PANEL
ALT: 14 U/L (ref 0–44)
AST: 15 U/L (ref 15–41)
Albumin: 4 g/dL (ref 3.5–5.0)
Alkaline Phosphatase: 48 U/L (ref 38–126)
Anion gap: 7 (ref 5–15)
BUN: 6 mg/dL — ABNORMAL LOW (ref 8–23)
CO2: 24 mmol/L (ref 22–32)
Calcium: 8.6 mg/dL — ABNORMAL LOW (ref 8.9–10.3)
Chloride: 107 mmol/L (ref 98–111)
Creatinine, Ser: 0.53 mg/dL (ref 0.44–1.00)
GFR, Estimated: >60 mL/min (ref >60)
Glucose, Bld: 115 mg/dL — ABNORMAL HIGH (ref 70–99)
Potassium: 3.5 mmol/L (ref 3.5–5.1)
Sodium: 138 mmol/L (ref 135–145)
Total Bilirubin: 0.7 mg/dL (ref 0.3–1.2)
Total Protein: 7 g/dL (ref 6.5–8.1)

## 2023-08-16 LAB — GLUCOSE, CAPILLARY: Glucose-Capillary: 121 mg/dL — ABNORMAL HIGH (ref 70–99)

## 2023-08-16 LAB — MAGNESIUM: Magnesium: 2.2 mg/dL (ref 1.7–2.4)

## 2023-08-16 LAB — PHOSPHORUS: Phosphorus: 2.5 mg/dL (ref 2.5–4.6)

## 2023-08-16 MED ORDER — GABAPENTIN 100 MG PO CAPS
100.0000 mg | ORAL_CAPSULE | Freq: Two times a day (BID) | ORAL | Status: DC
  Administered 2023-08-16 – 2023-08-17 (×3): 100 mg via ORAL
  Filled 2023-08-16 (×3): qty 1

## 2023-08-16 MED ORDER — DEXAMETHASONE 4 MG PO TABS
4.0000 mg | ORAL_TABLET | Freq: Every day | ORAL | Status: DC
  Administered 2023-08-16 – 2023-08-17 (×2): 4 mg via ORAL
  Filled 2023-08-16 (×2): qty 1

## 2023-08-16 MED ORDER — ONDANSETRON HCL 4 MG/2ML IJ SOLN: 4.0000 mg | Freq: Four times a day (QID) | INTRAMUSCULAR | Status: DC | PRN

## 2023-08-16 MED ORDER — ONDANSETRON HCL 4 MG PO TABS: 4.0000 mg | ORAL_TABLET | Freq: Four times a day (QID) | ORAL | Status: DC | PRN

## 2023-08-16 MED ORDER — PANTOPRAZOLE SODIUM 40 MG PO TBEC
80.0000 mg | DELAYED_RELEASE_TABLET | Freq: Every day | ORAL | Status: DC
  Administered 2023-08-16 – 2023-08-17 (×2): 80 mg via ORAL
  Filled 2023-08-16 (×2): qty 2

## 2023-08-16 MED ORDER — GUAIFENESIN-DM 100-10 MG/5ML PO SYRP
5.0000 mL | ORAL_SOLUTION | ORAL | Status: DC | PRN
  Administered 2023-08-16 – 2023-08-17 (×2): 5 mL via ORAL
  Filled 2023-08-16 (×2): qty 5

## 2023-08-16 MED ORDER — DILTIAZEM HCL ER COATED BEADS 180 MG PO CP24
360.0000 mg | ORAL_CAPSULE | Freq: Every day | ORAL | Status: DC
  Administered 2023-08-16: 360 mg via ORAL
  Filled 2023-08-16: qty 2

## 2023-08-16 MED ORDER — ACETAMINOPHEN 325 MG PO TABS
650.0000 mg | ORAL_TABLET | Freq: Four times a day (QID) | ORAL | Status: DC | PRN
  Administered 2023-08-16 (×2): 650 mg via ORAL
  Filled 2023-08-16 (×2): qty 2

## 2023-08-16 MED ORDER — PHENOL 1.4 % MT LIQD
1.0000 | OROMUCOSAL | Status: DC | PRN
  Administered 2023-08-16: 1 via OROMUCOSAL
  Filled 2023-08-16: qty 177

## 2023-08-16 MED ORDER — ATORVASTATIN CALCIUM 40 MG PO TABS
80.0000 mg | ORAL_TABLET | Freq: Every day | ORAL | Status: DC
  Administered 2023-08-16 – 2023-08-17 (×2): 80 mg via ORAL
  Filled 2023-08-16 (×2): qty 2

## 2023-08-16 MED ORDER — ENOXAPARIN SODIUM 40 MG/0.4ML IJ SOSY
40.0000 mg | PREFILLED_SYRINGE | INTRAMUSCULAR | Status: DC
  Filled 2023-08-16: qty 0.4

## 2023-08-16 MED ORDER — ALPRAZOLAM 0.5 MG PO TABS
0.5000 mg | ORAL_TABLET | Freq: Two times a day (BID) | ORAL | Status: DC | PRN
  Administered 2023-08-16: 0.5 mg via ORAL
  Filled 2023-08-16: qty 1

## 2023-08-16 MED ORDER — DICLOFENAC SODIUM 1 % EX GEL
2.0000 g | Freq: Four times a day (QID) | CUTANEOUS | Status: DC
  Administered 2023-08-16 – 2023-08-17 (×2): 2 g via TOPICAL
  Filled 2023-08-16 (×2): qty 100

## 2023-08-16 MED ORDER — ACETAMINOPHEN 650 MG RE SUPP: 650.0000 mg | Freq: Four times a day (QID) | RECTAL | Status: DC | PRN

## 2023-08-16 MED ORDER — LEVOTHYROXINE SODIUM 100 MCG PO TABS
100.0000 ug | ORAL_TABLET | Freq: Every day | ORAL | Status: DC
  Administered 2023-08-16 – 2023-08-17 (×2): 100 ug via ORAL
  Filled 2023-08-16 (×2): qty 1

## 2023-08-16 MED ORDER — APIXABAN 5 MG PO TABS
5.0000 mg | ORAL_TABLET | Freq: Two times a day (BID) | ORAL | Status: DC
  Administered 2023-08-16 – 2023-08-17 (×3): 5 mg via ORAL
  Filled 2023-08-16 (×3): qty 1

## 2023-08-16 MED ORDER — POTASSIUM CHLORIDE 10 MEQ/100ML IV SOLN
10.0000 meq | INTRAVENOUS | Status: AC
  Administered 2023-08-16 (×3): 10 meq via INTRAVENOUS
  Filled 2023-08-16 (×3): qty 100

## 2023-08-16 NOTE — Progress Notes (Signed)
   08/16/23 1449  Assess: MEWS Score  Temp 98.8 F (37.1 C)  BP (!) 134/116  MAP (mmHg) 123  Pulse Rate (!) 113  Resp (!) 24  SpO2 96 %  O2 Device Room Air  Assess: MEWS Score  MEWS Temp 0  MEWS Systolic 0  MEWS Pulse 2  MEWS RR 1  MEWS LOC 0  MEWS Score 3  MEWS Score Color Yellow  Assess: if the MEWS score is Yellow or Red  Were vital signs accurate and taken at a resting state? Yes  Does the patient have a confirmed or suspected source of infection? Yes  MEWS guidelines implemented  Yes, yellow  Treat  MEWS Interventions Considered administering scheduled or prn medications/treatments as ordered  Take Vital Signs  Increase Vital Sign Frequency  Yellow: Q2hr x1, continue Q4hrs until patient remains green for 12hrs  Escalate  MEWS: Escalate Yellow: Discuss with charge nurse and consider notifying provider and/or RRT  Notify: Charge Nurse/RN  Name of Charge Nurse/RN Notified Chales Abrahams, RN  Assess: SIRS CRITERIA  SIRS Temperature  0  SIRS Pulse 1  SIRS Respirations  1  SIRS WBC 0  SIRS Score Sum  2

## 2023-08-16 NOTE — Progress Notes (Signed)
ASSUMPTION OF CARE NOTE   08/16/2023 3:21 PM  [name] was seen and examined.  The H&P by the admitting provider, orders, imaging was reviewed.  Please see new orders.  Will continue to follow.   Vitals:   08/16/23 1129 08/16/23 1449  BP:  (!) 134/116  Pulse:  (!) 113  Resp:  (!) 24  Temp: 99.5 F (37.5 C) 98.8 F (37.1 C)  SpO2:  96%    Results for orders placed or performed during the hospital encounter of 08/15/23  SARS Coronavirus 2 by RT PCR (hospital order, performed in [facility] hospital lab) *cepheid single result test* Anterior Nasal Swab   Specimen: Anterior Nasal Swab  Result Value Ref Range   SARS Coronavirus 2 by RT PCR POSITIVE (A) NEGATIVE  Basic metabolic panel  Result Value Ref Range   Sodium 133 (L) 135 - 145 mmol/L   Potassium 3.3 (L) 3.5 - 5.1 mmol/L   Chloride 100 98 - 111 mmol/L   CO2 22 22 - 32 mmol/L   Glucose, Bld 138 (H) 70 - 99 mg/dL   BUN 6 (L) 8 - 23 mg/dL   Creatinine, Ser 4.09 0.44 - 1.00 mg/dL   Calcium 8.2 (L) 8.9 - 10.3 mg/dL   GFR, Estimated >81 >19 mL/min   Anion gap 11 5 - 15  CBC  Result Value Ref Range   WBC 10.4 4.0 - 10.5 K/uL   RBC 4.12 3.87 - 5.11 MIL/uL   Hemoglobin 11.7 (L) 12.0 - 15.0 g/dL   HCT 14.7 82.9 - 56.2 %   MCV 89.6 80.0 - 100.0 fL   MCH 28.4 26.0 - 34.0 pg   MCHC 31.7 30.0 - 36.0 g/dL   RDW 13.0 86.5 - 78.4 %   Platelets 221 150 - 400 K/uL   nRBC 0.0 0.0 - 0.2 %  Urinalysis, Routine w reflex microscopic -Urine, Clean Catch  Result Value Ref Range   Color, Urine STRAW (A) YELLOW   APPearance CLEAR CLEAR   Specific Gravity, Urine 1.005 1.005 - 1.030   pH 8.0 5.0 - 8.0   Glucose, UA NEGATIVE NEGATIVE mg/dL   Hgb urine dipstick NEGATIVE NEGATIVE   Bilirubin Urine NEGATIVE NEGATIVE   Ketones, ur NEGATIVE NEGATIVE mg/dL   Protein, ur NEGATIVE NEGATIVE mg/dL   Nitrite NEGATIVE NEGATIVE   Leukocytes,Ua NEGATIVE NEGATIVE  Hepatic function panel  Result Value Ref Range   Total Protein 6.5 6.5 - 8.1  g/dL   Albumin 3.9 3.5 - 5.0 g/dL   AST 19 15 - 41 U/L   ALT 14 0 - 44 U/L   Alkaline Phosphatase 46 38 - 126 U/L   Total Bilirubin 0.8 0.3 - 1.2 mg/dL   Bilirubin, Direct 0.1 0.0 - 0.2 mg/dL   Indirect Bilirubin 0.7 0.3 - 0.9 mg/dL  Blood gas, venous (at [facility] and [facility])  Result Value Ref Range   pH, Ven 7.47 (H) 7.25 - 7.43   pCO2, Ven 43 (L) 44 - 60 mmHg   pO2, Ven 52 (H) 32 - 45 mmHg   Bicarbonate 31.2 (H) 20.0 - 28.0 mmol/L   Acid-Base Excess 7.2 (H) 0.0 - 2.0 mmol/L   O2 Saturation 87.1 %   Patient temperature 38.2    Collection site BLOOD RIGHT WRIST    Drawn by 6962   TSH  Result Value Ref Range   TSH 0.376 0.350 - 4.500 uIU/mL  Comprehensive metabolic panel  Result Value Ref Range   Sodium 138 135 -  145 mmol/L   Potassium 3.5 3.5 - 5.1 mmol/L   Chloride 107 98 - 111 mmol/L   CO2 24 22 - 32 mmol/L   Glucose, Bld 115 (H) 70 - 99 mg/dL   BUN 6 (L) 8 - 23 mg/dL   Creatinine, Ser 1.61 0.44 - 1.00 mg/dL   Calcium 8.6 (L) 8.9 - 10.3 mg/dL   Total Protein 7.0 6.5 - 8.1 g/dL   Albumin 4.0 3.5 - 5.0 g/dL   AST 15 15 - 41 U/L   ALT 14 0 - 44 U/L   Alkaline Phosphatase 48 38 - 126 U/L   Total Bilirubin 0.7 0.3 - 1.2 mg/dL   GFR, Estimated >09 >60 mL/min   Anion gap 7 5 - 15  CBC  Result Value Ref Range   WBC 8.7 4.0 - 10.5 K/uL   RBC 4.42 3.87 - 5.11 MIL/uL   Hemoglobin 12.4 12.0 - 15.0 g/dL   HCT 45.4 09.8 - 11.9 %   MCV 91.9 80.0 - 100.0 fL   MCH 28.1 26.0 - 34.0 pg   MCHC 30.5 30.0 - 36.0 g/dL   RDW 14.7 82.9 - 56.2 %   Platelets 233 150 - 400 K/uL   nRBC 0.0 0.0 - 0.2 %  Magnesium  Result Value Ref Range   Magnesium 2.2 1.7 - 2.4 mg/dL  Phosphorus  Result Value Ref Range   Phosphorus 2.5 2.5 - 4.6 mg/dL  Glucose, capillary  Result Value Ref Range   Glucose-Capillary 121 (H) 70 - 99 mg/dL   Comment 1 Notify RN    Comment 2 Document in Chart   CBG monitoring, ED  Result Value Ref Range   Glucose-Capillary 145 (H) 70 - 99 mg/dL   Maryln Manuel, MD [facility]   08/15/2023  7:29 PM How to contact the [facility] Attending or Consulting provider 7A - 7P or covering provider during after hours 7P -7A, for this patient?  Check the care team in [facility] and look for a) attending/consulting [facility] provider listed and b) the [facility] team listed Log into [email or web address] and use [place]'s universal password to access. If you do not have the password, please contact the hospital operator. Locate the [facility] provider you are looking for under [facility] and page to a number that you can be directly reached. If you still have difficulty reaching the provider, please page the [facility] (Director on Call) for the Hospitalists listed on amion for assistance.

## 2023-08-16 NOTE — Plan of Care (Signed)
  Problem: Education: Goal: Knowledge of General Education information will improve Description Including pain rating scale, medication(s)/side effects and non-pharmacologic comfort measures Outcome: Progressing   Problem: Health Behavior/Discharge Planning: Goal: Ability to manage health-related needs will improve Outcome: Progressing   

## 2023-08-16 NOTE — TOC CM/SW Note (Signed)
Transition of Care [facility]) - Inpatient Brief Assessment   Patient Details  Name: GERALDYNE HUNSINGER MRN: [id number] Date of Birth: 1929-01-14  Transition of Care [facility]) CM/SW Contact:    Villa Herb, LCSWA Phone Number: 08/16/2023, 1:17 PM   Clinical Narrative: PT eval is pending at this time. TOC to follow for PT recommendations and assist with discharge planning. TOC to follow.   Transition of Care Asessment: Insurance and Status: Insurance coverage has been reviewed Patient has primary care physician: Yes Home environment has been reviewed: from home Prior level of function:: independent Prior/Current Home Services: No current home services Social Determinants of Health Reivew: SDOH reviewed no interventions necessary Readmission risk has been reviewed: Yes Transition of care needs: no transition of care needs at this time

## 2023-08-16 NOTE — Progress Notes (Signed)
   08/16/23 1718  Assess: MEWS Score  Temp 99 F (37.2 C)  BP (!) 149/90  MAP (mmHg) 106  Pulse Rate (!) 118  Resp (!) 24  SpO2 98 %  O2 Device Room Air  Assess: MEWS Score  MEWS Temp 0  MEWS Systolic 0  MEWS Pulse 2  MEWS RR 1  MEWS LOC 0  MEWS Score 3  MEWS Score Color Yellow  Assess: if the MEWS score is Yellow or Red  Were vital signs accurate and taken at a resting state? Yes  Does the patient have a confirmed or suspected source of infection? Yes  MEWS guidelines implemented  No, previously yellow, continue vital signs every 4 hours  Assess: SIRS CRITERIA  SIRS Temperature  0  SIRS Pulse 1  SIRS Respirations  1  SIRS WBC 0  SIRS Score Sum  2

## 2023-08-16 NOTE — H&P (Signed)
History and Physical    Patient: Kelsey Ruiz VFI:[id number] DOB: 1928-12-31 DOA: 08/15/2023 DOS: the patient was seen and examined on 08/16/2023 PCP: Assunta Found, MD  Patient coming from: Home  Chief Complaint:  Chief Complaint  Patient presents with   covid +   Weakness   HPI: [name] is a 89 y.o. female with medical history significant of atrial fibrillation, hypertension, hyperlipidemia who presents to the emergency department from home via EMS due to generalized weakness which was thought to be due to COVID.  Patient was unable to provide history, history was obtained from ED physician, ED medical record and daughter at bedside.  Per report, patient tested positive for COVID at home yesterday, apparently she was exposed to her daughter (lives with her) who was diagnosed with COVID 3 days earlier.  This was associated with several episodes of vomiting and 1 episode of large nonbloody watery stool.  On using bedside commode, patient was too weak to stand, so daughter assisted her to the floor since she was unable to lift her, EMS was activated and patient was taken to the ED for further evaluation and management.  ED Course:  In the emergency department, patient was febrile with a temperature of 100.9 F, respiratory rate 22/min, other vital signs were within normal range.  Workup in the ED showed normocytic anemia, BMP was normal except for sodium of 133, potassium 3.3, blood glucose 138.  Urinalysis was normal, TSH 0.376 hepatic function panel was normal.  SARS coronavirus 2 was positive. CT head without contrast showed no acute intracranial abnormality Chest x-ray showed low lung volumes with no acute process. Tylenol was given, IV hydration was provided. Hospitalist was asked to admit patient for further evaluation and management.  Review of Systems: Review of systems as noted in the HPI. All other systems reviewed and are negative.   Past Medical History:  Diagnosis  Date   Arthritis    GERD (gastroesophageal reflux disease)    Glaucoma    Hx of bladder infections    Hyperlipemia    Hypertension    Hypothyroidism    Past Surgical History:  Procedure Laterality Date   ABDOMINAL HYSTERECTOMY     BREAST BIOPSY Right 04/11/2014   Procedure: RIGHT BREAST BIOPSY AFTER NEEDLE LOCALIZATION;  Surgeon: Dalia Heading, MD;  Location: [facility];  Service: General;  Laterality: Right;  NEEDLE LOC @ 8:00   BREAST LUMPECTOMY Right    BREAST LUMPECTOMY Right    BREAST SURGERY Bilateral    benign cysts   CESAREAN SECTION     laser eyes Bilateral    for glaucoma   THYROIDECTOMY      Social History:  reports that she quit smoking about 39 years ago. Her smoking use included cigarettes. She started smoking about 49 years ago. She has a 5 pack-year smoking history. She has never used smokeless tobacco. She reports that she does not drink alcohol and does not use drugs.   Allergies  Allergen Reactions   Sulfa Antibiotics Nausea And Vomiting    Family History  Problem Relation Age of Onset   Breast cancer Neg Hx      Prior to Admission medications   Medication Sig Start Date End Date Taking? Authorizing Provider  alendronate (FOSAMAX) 70 MG tablet Take 70 mg by mouth once a week. Take with a full glass of water on an empty stomach. Take on Saturday.    [provider]  ALPRAZolam Prudy Feeler) 0.5 MG tablet Take  0.5 mg by mouth 2 (two) times daily as needed. 12/09/21   [provider]  apixaban (ELIQUIS) 5 MG TABS tablet Take 2 tablets (10 mg total) by mouth 2 (two) times daily for 7 days, THEN 1 tablet (5 mg total) 2 (two) times daily. 02/18/22 03/27/22  Zigmund Daniel., MD  atorvastatin (LIPITOR) 80 MG tablet Take 80 mg by mouth daily.  08/17/18   [provider]  baclofen (LIORESAL) 10 MG tablet Take 5 mg by mouth 2 (two) times daily. 01/14/22   [provider]  benzonatate (TESSALON) 200 MG capsule Take 1 capsule (200 mg total)  by mouth 3 (three) times daily as needed for cough. 10/16/19   Leatha Gilding, MD  Calcium-Magnesium-Zinc (CAL-MAG-ZINC PO) Take 1 tablet by mouth every morning.     [provider]  cholecalciferol (VITAMIN D) 1000 UNITS tablet Take 1,000 Units by mouth every morning.     [provider]  Coenzyme Q10 200 MG capsule Take 200 mg by mouth every morning.     [provider]  Cyanocobalamin (VITAMIN B-12 CR) 1500 MCG TBCR Take 1 tablet by mouth daily.    [provider]  diltiazem (CARDIZEM CD) 180 MG 24 hr capsule Take 180 mg by mouth See admin instructions. Take 2 capsules with evening meal 08/17/19   [provider]  furosemide (LASIX) 40 MG tablet Take 40 mg by mouth daily as needed. 08/25/19   [provider]  gabapentin (NEURONTIN) 100 MG capsule Take 100 mg by mouth 2 (two) times daily. 08/25/19   [provider]  HYDROcodone-acetaminophen (NORCO) 7.5-325 MG tablet Take 1 tablet by mouth every 6 (six) hours. 01/29/22   [provider]  HYDROcodone-homatropine (HYCODAN) 5-1.5 MG/5ML syrup Take 5 mLs by mouth every 6 (six) hours as needed. Patient not taking: Reported on 11/17/2018 09/17/18   Ivery Quale, PA-C  Lactobacillus (ACIDOPHILUS) 100 MG CAPS Take 1 capsule by mouth daily.    [provider]  levothyroxine (SYNTHROID) 100 MCG tablet Take 100 mcg by mouth daily. 12/30/21   [provider]  omeprazole (PRILOSEC) 40 MG capsule Take 40 mg by mouth every morning.     [provider]  ondansetron (ZOFRAN ODT) 4 MG disintegrating tablet Take 1 tablet (4 mg total) by mouth every 8 (eight) hours as needed for nausea or vomiting. 11/17/18   Burgess Amor, PA-C  vitamin C (ASCORBIC ACID) 500 MG tablet Take 1,000 mg by mouth every morning.     [provider]    Physical Exam: BP (!) 150/74 (BP Location: Left Arm)   Pulse 96   Temp 98.9 F (37.2 C)   Resp 20   Ht 5\' 3"  (1.6 m)   Wt 64.5 kg    SpO2 99%   BMI 25.19 kg/m   General: 89 y.o. year-old female ill appearing, but in no acute distress.   HEENT: NCAT, EOMI, dry mucous membrane Neck: Supple, trachea medial Cardiovascular: Regular rate and rhythm with no rubs or gallops.  No thyromegaly or JVD noted.  No lower extremity edema. 2/4 pulses in all 4 extremities. Respiratory: Clear to auscultation with no wheezes or rales. Good inspiratory effort. Abdomen: Soft, nontender nondistended with normal bowel sounds x4 quadrants. Muskuloskeletal: No cyanosis, clubbing or edema noted bilaterally Neuro: CN II-XII intact, no focal neurologic deficits.  Sensation, reflexes intact Skin: No ulcerative lesions noted or rashes Psychiatry: Mood is appropriate for condition and setting  Labs on Admission:  Basic Metabolic Panel: Recent Labs  Lab 08/15/23 1940  NA 133*  K 3.3*  CL 100  CO2 22  GLUCOSE 138*  BUN 6*  CREATININE 0.67  CALCIUM 8.2*   Liver Function Tests: Recent Labs  Lab 08/15/23 1940  AST 19  ALT 14  ALKPHOS 46  BILITOT 0.8  PROT 6.5  ALBUMIN 3.9   No results for input(s): "LIPASE", "AMYLASE" in the last 168 hours. No results for input(s): "AMMONIA" in the last 168 hours. CBC: Recent Labs  Lab 08/15/23 1940  WBC 10.4  HGB 11.7*  HCT 36.9  MCV 89.6  PLT 221   Cardiac Enzymes: No results for input(s): "CKTOTAL", "CKMB", "CKMBINDEX", "TROPONINI" in the last 168 hours.  BNP (last 3 results) No results for input(s): "BNP" in the last 8760 hours.  ProBNP (last 3 results) No results for input(s): "PROBNP" in the last 8760 hours.  CBG: Recent Labs  Lab 08/15/23 1951  GLUCAP 145*    Radiological Exams on Admission: CT Head Wo Contrast  Result Date: 08/15/2023 CLINICAL DATA:  Mental status change, unknown cause EXAM: CT HEAD WITHOUT CONTRAST TECHNIQUE: Contiguous axial images were obtained from the base of the skull through the vertex without intravenous contrast. RADIATION DOSE  REDUCTION: This exam was performed according to the departmental dose-optimization program which includes automated exposure control, adjustment of the mA and/or kV according to patient size and/or use of iterative reconstruction technique. COMPARISON:  04/11/2013 FINDINGS: Brain: There is atrophy and chronic small vessel disease changes. No acute intracranial abnormality. Specifically, no hemorrhage, hydrocephalus, mass lesion, acute infarction, or significant intracranial injury. Vascular: No hyperdense vessel or unexpected calcification. Skull: No acute calvarial abnormality. Sinuses/Orbits: No acute findings Other: None IMPRESSION: Atrophy, chronic microvascular disease. No acute intracranial abnormality. Electronically Signed   By: Charlett Nose M.D.   On: 08/15/2023 23:23   DG Chest Port 1 View  Result Date: 08/15/2023 CLINICAL DATA:  COVID infection EXAM: PORTABLE CHEST 1 VIEW COMPARISON:  02/17/2022 FINDINGS: Single frontal view of the chest demonstrates a stable cardiac silhouette. Continued ectasia and atherosclerosis of the thoracic aorta. Low lung volumes, with no acute airspace disease, effusion, or pneumothorax. No acute bony abnormalities. Stable midthoracic compression deformity and vertebral augmentation. IMPRESSION: 1. Low lung volumes.  No acute process. Electronically Signed   By: Sharlet Salina M.D.   On: 08/15/2023 21:46    EKG: I independently viewed the EKG done and my findings are as followed: Sinus tachycardia at a rate of 101 bpm  Assessment/Plan Present on Admission:  COVID-19 virus infection  Essential hypertension  Principal Problem:   COVID-19 virus infection Active Problems:   Essential hypertension   Generalized weakness   Vomiting   Diarrhea   Dehydration   Atrial fibrillation, chronic (HCC)   Mixed hyperlipidemia   GERD without esophagitis  COVID-19 virus infection Patient presents with no shortness of breath Continue symptomatic treatment Continue Tylenol  p.r.n. for fever Continue airborne isolation precaution  Generalized weakness in setting of above This is possibly due to patient's vomiting and diarrhea IV fluid provided  Vomiting and diarrhea Continue IV fluid Continue Zofran as needed  Dehydration Continue IV fluid  Hypokalemia K+ 3.3, this will be replenished  Chronic atrial fibrillation EKG personally reviewed showed sinus tachycardia at rate of 101 bpm Continue Cardizem  Essential hypertension Continue Cardizem Lasix temporarily held due to dehydration and mild hypokalemia  Mixed hyperlipidemia Continue Lipitor  GERD Continue Protonix   DVT prophylaxis: Lovenox (  patient appears to have been taking off Eliquis since April 2023, patient unable to confirm anticoagulative status considering history of A-fib (though currently in sinus tachycardia)).   Advance Care Planning: CODE STATUS: Full code  Consults: None  Family Communication: None at bedside  Severity of Illness: The appropriate patient status for this patient is INPATIENT. Inpatient status is judged to be reasonable and necessary in order to provide the required intensity of service to ensure the patient's safety. The patient's presenting symptoms, physical exam findings, and initial radiographic and laboratory data in the context of their chronic comorbidities is felt to place them at high risk for further clinical deterioration. Furthermore, it is not anticipated that the patient will be medically stable for discharge from the hospital within 2 midnights of admission.   * I certify that at the point of admission it is my clinical judgment that the patient will require inpatient hospital care spanning beyond 2 midnights from the point of admission due to high intensity of service, high risk for further deterioration and high frequency of surveillance required.*  Author: Frankey Shown, DO 08/16/2023 3:04 AM  For on call review [email or web address].

## 2023-08-17 DIAGNOSIS — E860 Dehydration: Secondary | ICD-10-CM | POA: Diagnosis not present

## 2023-08-17 DIAGNOSIS — U071 COVID-19: Secondary | ICD-10-CM | POA: Diagnosis not present

## 2023-08-17 DIAGNOSIS — I482 Chronic atrial fibrillation, unspecified: Secondary | ICD-10-CM | POA: Diagnosis not present

## 2023-08-17 DIAGNOSIS — E782 Mixed hyperlipidemia: Secondary | ICD-10-CM | POA: Diagnosis not present

## 2023-08-17 MED ORDER — HYDROCODONE-ACETAMINOPHEN 7.5-325 MG PO TABS
1.0000 | ORAL_TABLET | Freq: Four times a day (QID) | ORAL | Status: DC
  Administered 2023-08-17: 1 via ORAL
  Filled 2023-08-17: qty 1

## 2023-08-17 MED ORDER — HYDROCODONE-ACETAMINOPHEN 7.5-325 MG PO TABS: 1.0000 | ORAL_TABLET | Freq: Four times a day (QID) | ORAL | Status: DC

## 2023-08-17 MED ORDER — DEXAMETHASONE 4 MG PO TABS: 4.0000 mg | ORAL_TABLET | Freq: Every day | ORAL | 0 refills | Status: AC

## 2023-08-17 NOTE — Care Management Important Message (Signed)
Important Message  Patient Details  Name: ORVELLA DUSHAJ MRN: [id number] Date of Birth: February 09, 1929   Medicare Important Message Given:  N/A - LOS <3 / Initial given by admissions     Corey Harold 08/17/2023, 11:57 AM

## 2023-08-17 NOTE — Evaluation (Signed)
Physical Therapy Evaluation Patient Details Name: Kelsey Ruiz MRN: [id number] DOB: June 10, 1929 Today's Date: 08/17/2023  History of Present Illness  Kelsey Ruiz is a 89 y.o. female with medical history significant of atrial fibrillation, hypertension, hyperlipidemia who presents to the emergency department from home via EMS due to generalized weakness which was thought to be due to COVID.  Patient was unable to provide history, history was obtained from ED physician, ED medical record and daughter at bedside.  Per report, patient tested positive for COVID at home yesterday, apparently she was exposed to her daughter (lives with her) who was diagnosed with COVID 3 days earlier.  This was associated with several episodes of vomiting and 1 episode of large nonbloody watery stool.  On using bedside commode, patient was too weak to stand, so daughter assisted her to the floor since she was unable to lift her, EMS was activated and patient was taken to the ED for further evaluation and management.   Clinical Impression  Patient functioning at baseline for functional mobility and gait with good return for ambulating in room without loss of balance on room air with SpO2 at 100% after walking.  Plan:  Patient discharged from physical therapy to care of nursing for ambulation daily as tolerated for length of stay.          If plan is discharge home, recommend the following: Help with stairs or ramp for entrance;Assistance with cooking/housework;A little help with bathing/dressing/bathroom   Can travel by private vehicle        Equipment Recommendations None recommended by PT  Recommendations for Other Services       Functional Status Assessment Patient has not had a recent decline in their functional status     Precautions / Restrictions Precautions Precautions: Fall Restrictions Weight Bearing Restrictions: No      Mobility  Bed Mobility Overal bed mobility: Modified  Independent                  Transfers Overall transfer level: Modified independent                      Ambulation/Gait Ambulation/Gait assistance: Modified independent (Device/Increase time) Gait Distance (Feet): 50 Feet Assistive device: Rolling walker (2 wheels) Gait Pattern/deviations: Decreased step length - right, Decreased step length - left, Decreased stride length, Trunk flexed Gait velocity: decreased     General Gait Details: good return for ambulating in room without loss of balance, limited mostly due to fatigue, SpO2 at 100% after walking  Stairs            Wheelchair Mobility     Tilt Bed    Modified Rankin (Stroke Patients Only)       Balance Overall balance assessment: Needs assistance Sitting-balance support: Feet supported, No upper extremity supported Sitting balance-Leahy Scale: Good Sitting balance - Comments: seated at EOB   Standing balance support: During functional activity, Bilateral upper extremity supported Standing balance-Leahy Scale: Fair Standing balance comment: fair/good using RW                             Pertinent Vitals/Pain Pain Assessment Pain Assessment: No/denies pain    Home Living Family/patient expects to be discharged to:: Private residence Living Arrangements: Children Available Help at Discharge: Family;Available 24 hours/day Type of Home: House Home Access: Stairs to enter Entrance Stairs-Rails: Right;Left;Can reach both Entrance Stairs-Number of Steps: 2   Home Layout:  One level Home Equipment: Rollator (4 wheels);Grab bars - tub/shower;Wheelchair - manual;Grab bars - Geophysicist/field seismologist (2 wheels)      Prior Function Prior Level of Function : Needs assist       Physical Assist : Mobility (physical);ADLs (physical) Mobility (physical): Bed mobility;Transfers;Gait;Stairs   Mobility Comments: household ambulator using Rollator ADLs Comments: Assisted by  family     Extremity/Trunk Assessment   Upper Extremity Assessment Upper Extremity Assessment: Overall WFL for tasks assessed    Lower Extremity Assessment Lower Extremity Assessment: Overall WFL for tasks assessed    Cervical / Trunk Assessment Cervical / Trunk Assessment: Kyphotic  Communication   Communication Communication: No apparent difficulties;Hearing impairment  Cognition Arousal: Alert Behavior During Therapy: WFL for tasks assessed/performed Overall Cognitive Status: Within Functional Limits for tasks assessed                                          General Comments      Exercises     Assessment/Plan    PT Assessment Patient does not need any further PT services  PT Problem List         PT Treatment Interventions      PT Goals (Current goals can be found in the Care Plan section)  Acute Rehab PT Goals Patient Stated Goal: return home with family to assist PT Goal Formulation: With patient/family Time For Goal Achievement: 08/17/23 Potential to Achieve Goals: Good    Frequency       Co-evaluation               AM-PAC PT "6 Clicks" Mobility  Outcome Measure Help needed turning from your back to your side while in a flat bed without using bedrails?: None Help needed moving from lying on your back to sitting on the side of a flat bed without using bedrails?: None Help needed moving to and from a bed to a chair (including a wheelchair)?: None Help needed standing up from a chair using your arms (e.g., wheelchair or bedside chair)?: None Help needed to walk in hospital room?: A Little Help needed climbing 3-5 steps with a railing? : A Little 6 Click Score: 22    End of Session   Activity Tolerance: Patient tolerated treatment well;Patient limited by fatigue Patient left: in chair;with call bell/phone within reach;with family/visitor present Nurse Communication: Mobility status PT Visit Diagnosis: Unsteadiness on feet  (R26.81);Other abnormalities of gait and mobility (R26.89);Muscle weakness (generalized) (M62.81)    Time: 1914-7829 PT Time Calculation (min) (ACUTE ONLY): 28 min   Charges:   PT Evaluation $PT Eval Moderate Complexity: 1 Mod PT Treatments $Therapeutic Activity: 23-37 mins PT General Charges $$ ACUTE PT VISIT: 1 Visit         12:34 PM, 08/17/23 [name], MPT Physical Therapist with [facility] 336 [phone] office [phone] mobile phone

## 2023-08-17 NOTE — Discharge Summary (Signed)
Physician Discharge Summary  Kelsey Ruiz IHK:[id number] DOB: [birth year] DOA: 08/15/2023  PCP: Assunta Found, MD  Admit date: 08/15/2023 Discharge date: 08/17/2023  Admitted From:  Home  Disposition: Home   Recommendations for Outpatient Follow-up:  Follow up with PCP in 1-2 weeks  Discharge Condition: STABLE   CODE STATUS: FULL DIET: heart healthy low sodium    Brief Hospitalization Summary: Please see all hospital notes, images, labs for full details of the hospitalization. Admission Provider HPI:  89 y.o. female with medical history significant of atrial fibrillation, hypertension, hyperlipidemia who presents to the emergency department from home via EMS due to generalized weakness which was thought to be due to COVID.  Patient was unable to provide history, history was obtained from ED physician, ED medical record and daughter at bedside.  Per report, patient tested positive for COVID at home yesterday, apparently she was exposed to her daughter (lives with her) who was diagnosed with COVID 3 days earlier.  This was associated with several episodes of vomiting and 1 episode of large nonbloody watery stool.  On using bedside commode, patient was too weak to stand, so daughter assisted her to the floor since she was unable to lift her, EMS was activated and patient was taken to the ED for further evaluation and management.   ED Course:  In the emergency department, patient was febrile with a temperature of 100.9 F, respiratory rate 22/min, other vital signs were within normal range.  Workup in the ED showed normocytic anemia, BMP was normal except for sodium of 133, potassium 3.3, blood glucose 138.  Urinalysis was normal, TSH 0.376 hepatic function panel was normal.  SARS coronavirus 2 was positive.  CT head without contrast showed no acute intracranial abnormality Chest x-ray showed low lung volumes with no acute process.  Tylenol was given, IV hydration was provided.  Hospitalist was asked  to admit patient for further evaluation and management.  Hospital course  89 year old female presented with generalized weakness and upper respiratory symptoms secondary to COVID infection.  Patient reports that she is vaccinated and boosted for COVID.  She does not have a supplemental oxygen requirement.  She did not have acute findings on chest x-ray.  She was clinically dehydrated and treated with IV fluids.  Given her high risk status she was started on Decadron with a plan to continue a full 10-day course.  Fortunately she responded well to this therapy and she is feeling better and feeling ready to go home.  She has support at home.  We got a PT evaluation on her and no further PT recommendations.  She is being discharged home in stable condition.  Follow-up with PCP in 1 to 2 weeks recommended.  Patient and family advised that if she has any deterioration of her condition to return to emergency department for evaluation.  They verbalized understanding.  Discharge Diagnoses:  Principal Problem:   COVID-19 virus infection Active Problems:   Essential hypertension   Generalized weakness   Vomiting   Diarrhea   Dehydration   Atrial fibrillation, chronic (HCC)   Mixed hyperlipidemia   GERD without esophagitis   Discharge Instructions:  Allergies as of 08/17/2023       Reactions   Sulfa Antibiotics Nausea And Vomiting        Medication List     STOP taking these medications    ciprofloxacin 500 MG tablet Commonly known as: CIPRO       TAKE these medications    Acidophilus 100  MG Caps Take 1 capsule by mouth daily.   ALPRAZolam 0.5 MG tablet Commonly known as: XANAX Take 0.5 mg by mouth 2 (two) times daily as needed.   apixaban 5 MG Tabs tablet Commonly known as: ELIQUIS Take 2 tablets (10 mg total) by mouth 2 (two) times daily for 7 days, THEN 1 tablet (5 mg total) 2 (two) times daily. Start taking on: February 18, 2022   ascorbic acid 500 MG tablet Commonly known  as: VITAMIN C Take 1,000 mg by mouth every morning.   atorvastatin 80 MG tablet Commonly known as: LIPITOR Take 80 mg by mouth daily.   CAL-MAG-ZINC PO Take 1 tablet by mouth every morning.   cholecalciferol 1000 units tablet Commonly known as: VITAMIN D Take 1,000 Units by mouth every morning.   clotrimazole-betamethasone cream Commonly known as: LOTRISONE Apply topically.   Coenzyme Q10 200 MG capsule Take 200 mg by mouth every morning.   dexamethasone 4 MG tablet Commonly known as: DECADRON Take 1 tablet (4 mg total) by mouth daily for 8 days. Start taking on: August 18, 2023   diltiazem 180 MG 24 hr capsule Commonly known as: CARDIZEM CD Take 180 mg by mouth See admin instructions. Take 2 capsules with evening meal   furosemide 40 MG tablet Commonly known as: LASIX Take 40 mg by mouth daily as needed.   gabapentin 100 MG capsule Commonly known as: NEURONTIN Take 100 mg by mouth 2 (two) times daily.   HYDROcodone-acetaminophen 7.5-325 MG tablet Commonly known as: NORCO Take 1 tablet by mouth every 6 (six) hours.   levothyroxine 100 MCG tablet Commonly known as: SYNTHROID Take 100 mcg by mouth daily.   omeprazole 40 MG capsule Commonly known as: PRILOSEC Take 40 mg by mouth every morning.   ondansetron 4 MG disintegrating tablet Commonly known as: Zofran ODT Take 1 tablet (4 mg total) by mouth every 8 (eight) hours as needed for nausea or vomiting.   potassium chloride SA 20 MEQ tablet Commonly known as: KLOR-CON M Take 20 mEq by mouth 2 (two) times daily as needed.   Vitamin B-12 CR 1500 MCG Tbcr Take 1 tablet by mouth daily.        Follow-up Information     Assunta Found, MD. Schedule an appointment as soon as possible for a visit in 2 week(s).   Specialty: Family Medicine Why: Hospital Follow Up Contact information: [address] [place] [address] [phone]                Allergies  Allergen Reactions   Sulfa  Antibiotics Nausea And Vomiting   Allergies as of 08/17/2023       Reactions   Sulfa Antibiotics Nausea And Vomiting        Medication List     STOP taking these medications    ciprofloxacin 500 MG tablet Commonly known as: CIPRO       TAKE these medications    Acidophilus 100 MG Caps Take 1 capsule by mouth daily.   ALPRAZolam 0.5 MG tablet Commonly known as: XANAX Take 0.5 mg by mouth 2 (two) times daily as needed.   apixaban 5 MG Tabs tablet Commonly known as: ELIQUIS Take 2 tablets (10 mg total) by mouth 2 (two) times daily for 7 days, THEN 1 tablet (5 mg total) 2 (two) times daily. Start taking on: February 18, 2022   ascorbic acid 500 MG tablet Commonly known as: VITAMIN C Take 1,000 mg by mouth every morning.  atorvastatin 80 MG tablet Commonly known as: LIPITOR Take 80 mg by mouth daily.   CAL-MAG-ZINC PO Take 1 tablet by mouth every morning.   cholecalciferol 1000 units tablet Commonly known as: VITAMIN D Take 1,000 Units by mouth every morning.   clotrimazole-betamethasone cream Commonly known as: LOTRISONE Apply topically.   Coenzyme Q10 200 MG capsule Take 200 mg by mouth every morning.   dexamethasone 4 MG tablet Commonly known as: DECADRON Take 1 tablet (4 mg total) by mouth daily for 8 days. Start taking on: August 18, 2023   diltiazem 180 MG 24 hr capsule Commonly known as: CARDIZEM CD Take 180 mg by mouth See admin instructions. Take 2 capsules with evening meal   furosemide 40 MG tablet Commonly known as: LASIX Take 40 mg by mouth daily as needed.   gabapentin 100 MG capsule Commonly known as: NEURONTIN Take 100 mg by mouth 2 (two) times daily.   HYDROcodone-acetaminophen 7.5-325 MG tablet Commonly known as: NORCO Take 1 tablet by mouth every 6 (six) hours.   levothyroxine 100 MCG tablet Commonly known as: SYNTHROID Take 100 mcg by mouth daily.   omeprazole 40 MG capsule Commonly known as: PRILOSEC Take 40 mg by mouth  every morning.   ondansetron 4 MG disintegrating tablet Commonly known as: Zofran ODT Take 1 tablet (4 mg total) by mouth every 8 (eight) hours as needed for nausea or vomiting.   potassium chloride SA 20 MEQ tablet Commonly known as: KLOR-CON M Take 20 mEq by mouth 2 (two) times daily as needed.   Vitamin B-12 CR 1500 MCG Tbcr Take 1 tablet by mouth daily.        Procedures/Studies: CT Head Wo Contrast  Result Date: 08/15/2023 CLINICAL DATA:  Mental status change, unknown cause EXAM: CT HEAD WITHOUT CONTRAST TECHNIQUE: Contiguous axial images were obtained from the base of the skull through the vertex without intravenous contrast. RADIATION DOSE REDUCTION: This exam was performed according to the departmental dose-optimization program which includes automated exposure control, adjustment of the mA and/or kV according to patient size and/or use of iterative reconstruction technique. COMPARISON:  04/11/2013 FINDINGS: Brain: There is atrophy and chronic small vessel disease changes. No acute intracranial abnormality. Specifically, no hemorrhage, hydrocephalus, mass lesion, acute infarction, or significant intracranial injury. Vascular: No hyperdense vessel or unexpected calcification. Skull: No acute calvarial abnormality. Sinuses/Orbits: No acute findings Other: None IMPRESSION: Atrophy, chronic microvascular disease. No acute intracranial abnormality. Electronically Signed   By: Charlett Nose M.D.   On: 08/15/2023 23:23   DG Chest Port 1 View  Result Date: 08/15/2023 CLINICAL DATA:  COVID infection EXAM: PORTABLE CHEST 1 VIEW COMPARISON:  02/17/2022 FINDINGS: Single frontal view of the chest demonstrates a stable cardiac silhouette. Continued ectasia and atherosclerosis of the thoracic aorta. Low lung volumes, with no acute airspace disease, effusion, or pneumothorax. No acute bony abnormalities. Stable midthoracic compression deformity and vertebral augmentation. IMPRESSION: 1. Low lung  volumes.  No acute process. Electronically Signed   By: Sharlet Salina M.D.   On: 08/15/2023 21:46     Subjective: Pt reports that she is feeling better and feels well to go home. She was able to work with PT.    Discharge Exam: Vitals:   08/17/23 0527 08/17/23 0847  BP: (!) 152/76 (!) 152/72  Pulse: 79 69  Resp: 18 20  Temp: 97.8 F (36.6 C) 97.7 F (36.5 C)  SpO2: 97% 100%   Vitals:   08/17/23 0200 08/17/23 0527 08/17/23 0527 08/17/23 2130  BP: 125/66  (!) 152/76 (!) 152/72  Pulse: 63  79 69  Resp: 20  18 20   Temp: 97.6 F (36.4 C) 97.8 F (36.6 C) 97.8 F (36.6 C) 97.7 F (36.5 C)  TempSrc: Oral Oral    SpO2: 98%  97% 100%  Weight:      Height:       General: Pt is alert, awake, not in acute distress Cardiovascular: normal S1/S2 +, no rubs, no gallops Respiratory: CTA bilaterally, no wheezing, no rhonchi Abdominal: Soft, NT, ND, bowel sounds + Extremities: no edema, no cyanosis   The results of significant diagnostics from this hospitalization (including imaging, microbiology, ancillary and laboratory) are listed below for reference.     Microbiology: Recent Results (from the past 240 hour(s))  SARS Coronavirus 2 by RT PCR (hospital order, performed in [facility] hospital lab) *cepheid single result test* Anterior Nasal Swab     Status: Abnormal   Collection Time: 08/15/23  7:40 PM   Specimen: Anterior Nasal Swab  Result Value Ref Range Status   SARS Coronavirus 2 by RT PCR POSITIVE (A) NEGATIVE Final    Comment: (NOTE) SARS-CoV-2 target nucleic acids are DETECTED  SARS-CoV-2 RNA is generally detectable in upper respiratory specimens  during the acute phase of infection.  Positive results are indicative  of the presence of the identified virus, but do not rule out bacterial infection or co-infection with other pathogens not detected by the test.  Clinical correlation with patient history and  other diagnostic information is necessary to determine  patient infection status.  The expected result is negative.  Fact Sheet for Patients:   RoadLapTop.co.za   Fact Sheet for Healthcare Providers:   http://kim-miller.com/    This test is not yet approved or cleared by the [place] FDA and  has been authorized for detection and/or diagnosis of SARS-CoV-2 by FDA under an Emergency Use Authorization (EUA).  This EUA will remain in effect (meaning this test can be used) for the duration of  the COVID-19 declaration under Section 564(b)(1)  of the Act, 21 U.S.C. section 360-bbb-3(b)(1), unless the authorization is terminated or revoked sooner.   Performed at [facility], [address] [place], [place] [address]      Labs: BNP (last 3 results) No results for input(s): "BNP" in the last 8760 hours. Basic Metabolic Panel: Recent Labs  Lab 08/15/23 1940 08/16/23 0436  NA 133* 138  K 3.3* 3.5  CL 100 107  CO2 22 24  GLUCOSE 138* 115*  BUN 6* 6*  CREATININE 0.67 0.53  CALCIUM 8.2* 8.6*  MG  --  2.2  PHOS  --  2.5   Liver Function Tests: Recent Labs  Lab 08/15/23 1940 08/16/23 0436  AST 19 15  ALT 14 14  ALKPHOS 46 48  BILITOT 0.8 0.7  PROT 6.5 7.0  ALBUMIN 3.9 4.0   No results for input(s): "LIPASE", "AMYLASE" in the last 168 hours. No results for input(s): "AMMONIA" in the last 168 hours. CBC: Recent Labs  Lab 08/15/23 1940 08/16/23 0436  WBC 10.4 8.7  HGB 11.7* 12.4  HCT 36.9 40.6  MCV 89.6 91.9  PLT 221 233   Cardiac Enzymes: No results for input(s): "CKTOTAL", "CKMB", "CKMBINDEX", "TROPONINI" in the last 168 hours. BNP: Invalid input(s): "POCBNP" CBG: Recent Labs  Lab 08/15/23 1951 08/16/23 0906  GLUCAP 145* 121*   D-Dimer No results for input(s): "DDIMER" in the last 72 hours. Hgb A1c No results for input(s): "HGBA1C" in  the last 72 hours. Lipid Profile No results for input(s): "CHOL", "HDL", "LDLCALC", "TRIG", "CHOLHDL", "LDLDIRECT" in the  last 72 hours. Thyroid function studies Recent Labs    08/15/23 1940  TSH 0.376   Anemia work up No results for input(s): "VITAMINB12", "FOLATE", "FERRITIN", "TIBC", "IRON", "RETICCTPCT" in the last 72 hours. Urinalysis    Component Value Date/Time   COLORURINE STRAW (A) 08/15/2023 2249   APPEARANCEUR CLEAR 08/15/2023 2249   LABSPEC 1.005 08/15/2023 2249   PHURINE 8.0 08/15/2023 2249   GLUCOSEU NEGATIVE 08/15/2023 2249   HGBUR NEGATIVE 08/15/2023 2249   BILIRUBINUR NEGATIVE 08/15/2023 2249   KETONESUR NEGATIVE 08/15/2023 2249   PROTEINUR NEGATIVE 08/15/2023 2249   UROBILINOGEN 0.2 07/07/2014 1129   NITRITE NEGATIVE 08/15/2023 2249   LEUKOCYTESUR NEGATIVE 08/15/2023 2249   Sepsis Labs Recent Labs  Lab 08/15/23 1940 08/16/23 0436  WBC 10.4 8.7   Microbiology Recent Results (from the past 240 hour(s))  SARS Coronavirus 2 by RT PCR (hospital order, performed in [facility] hospital lab) *cepheid single result test* Anterior Nasal Swab     Status: Abnormal   Collection Time: 08/15/23  7:40 PM   Specimen: Anterior Nasal Swab  Result Value Ref Range Status   SARS Coronavirus 2 by RT PCR POSITIVE (A) NEGATIVE Final    Comment: (NOTE) SARS-CoV-2 target nucleic acids are DETECTED  SARS-CoV-2 RNA is generally detectable in upper respiratory specimens  during the acute phase of infection.  Positive results are indicative  of the presence of the identified virus, but do not rule out bacterial infection or co-infection with other pathogens not detected by the test.  Clinical correlation with patient history and  other diagnostic information is necessary to determine patient infection status.  The expected result is negative.  Fact Sheet for Patients:   RoadLapTop.co.za   Fact Sheet for Healthcare Providers:   http://kim-miller.com/    This test is not yet approved or cleared by the [place] FDA and  has been authorized for  detection and/or diagnosis of SARS-CoV-2 by FDA under an Emergency Use Authorization (EUA).  This EUA will remain in effect (meaning this test can be used) for the duration of  the COVID-19 declaration under Section 564(b)(1)  of the Act, 21 U.S.C. section 360-bbb-3(b)(1), unless the authorization is terminated or revoked sooner.   Performed at [facility], [address], [place], [place] [address]    Time coordinating discharge:   SIGNED:  Standley Dakins, MD  [facility] 08/17/2023, 10:31 AM How to contact the [facility] Attending or Consulting provider 7A - 7P or covering provider during after hours 7P -7A, for this patient?  Check the care team in [facility] and look for a) attending/consulting [facility] provider listed and b) the [facility] team listed Log into [email or web address] and use [facility]'s universal password to access. If you do not have the password, please contact the hospital operator. Locate the [facility] provider you are looking for under [facility] and page to a number that you can be directly reached. If you still have difficulty reaching the provider, please page the [facility] (Director on Call) for the Hospitalists listed on amion for assistance.

## 2023-08-17 NOTE — Discharge Instructions (Signed)
IMPORTANT INFORMATION: PAY CLOSE ATTENTION  ? ?PHYSICIAN DISCHARGE INSTRUCTIONS ? ?Follow with Primary care provider  Golding, John, MD  and other consultants as instructed by your Hospitalist Physician ? ?SEEK MEDICAL CARE OR RETURN TO EMERGENCY ROOM IF SYMPTOMS COME BACK, WORSEN OR NEW PROBLEM DEVELOPS  ? ?Please note: ?You were cared for by a hospitalist during your hospital stay. Every effort will be made to forward records to your primary care provider.  You can request that your primary care provider send for your hospital records if they have not received them.  Once you are discharged, your primary care physician will handle any further medical issues. Please note that NO REFILLS for any discharge medications will be authorized once you are discharged, as it is imperative that you return to your primary care physician (or establish a relationship with a primary care physician if you do not have one) for your post hospital discharge needs so that they can reassess your need for medications and monitor your lab values. ? ?Please get a complete blood count and chemistry panel checked by your Primary MD at your next visit, and again as instructed by your Primary MD. ? ?Get Medicines reviewed and adjusted: ?Please take all your medications with you for your next visit with your Primary MD ? ?Laboratory/radiological data: ?Please request your Primary MD to go over all hospital tests and procedure/radiological results at the follow up, please ask your primary care provider to get all Hospital records sent to his/her office. ? ?In some cases, they will be blood work, cultures and biopsy results pending at the time of your discharge. Please request that your primary care provider follow up on these results. ? ?If you are diabetic, please bring your blood sugar readings with you to your follow up appointment with primary care.   ? ?Please call and make your follow up appointments as soon as possible.   ? ?Also Note  the following: ?If you experience worsening of your admission symptoms, develop shortness of breath, life threatening emergency, suicidal or homicidal thoughts you must seek medical attention immediately by calling 911 or calling your MD immediately  if symptoms less severe. ? ?You must read complete instructions/literature along with all the possible adverse reactions/side effects for all the Medicines you take and that have been prescribed to you. Take any new Medicines after you have completely understood and accpet all the possible adverse reactions/side effects.  ? ?Do not drive when taking Pain medications or sleeping medications (Benzodiazepines) ? ?Do not take more than prescribed Pain, Sleep and Anxiety Medications. It is not advisable to combine anxiety,sleep and pain medications without talking with your primary care practitioner ? ?Special Instructions: If you have smoked or chewed Tobacco  in the last 2 yrs please stop smoking, stop any regular Alcohol  and or any Recreational drug use. ? ?Wear Seat belts while driving.  Do not drive if taking any narcotic, mind altering or controlled substances or recreational drugs or alcohol.  ? ? ? ? ? ?

## 2023-08-23 DIAGNOSIS — U071 COVID-19: Secondary | ICD-10-CM | POA: Diagnosis not present

## 2023-08-23 DIAGNOSIS — G894 Chronic pain syndrome: Secondary | ICD-10-CM | POA: Diagnosis not present

## 2023-08-25 ENCOUNTER — Other Ambulatory Visit: Payer: Self-pay

## 2023-08-25 ENCOUNTER — Emergency Department (HOSPITAL_COMMUNITY)
Admission: EM | Admit: 2023-08-25 | Discharge: 2023-08-25 | Disposition: A | Discharge status: Home / Self Care | Payer: Medicare Other

## 2023-08-25 ENCOUNTER — Emergency Department (HOSPITAL_COMMUNITY): Payer: Medicare Other

## 2023-08-25 DIAGNOSIS — R001 Bradycardia, unspecified: Secondary | ICD-10-CM | POA: Diagnosis not present

## 2023-08-25 DIAGNOSIS — E039 Hypothyroidism, unspecified: Secondary | ICD-10-CM | POA: Insufficient documentation

## 2023-08-25 DIAGNOSIS — Z79899 Other long term (current) drug therapy: Secondary | ICD-10-CM | POA: Insufficient documentation

## 2023-08-25 DIAGNOSIS — Z7901 Long term (current) use of anticoagulants: Secondary | ICD-10-CM | POA: Insufficient documentation

## 2023-08-25 DIAGNOSIS — D72829 Elevated white blood cell count, unspecified: Secondary | ICD-10-CM | POA: Insufficient documentation

## 2023-08-25 DIAGNOSIS — E876 Hypokalemia: Secondary | ICD-10-CM | POA: Diagnosis not present

## 2023-08-25 DIAGNOSIS — R5381 Other malaise: Secondary | ICD-10-CM | POA: Diagnosis not present

## 2023-08-25 DIAGNOSIS — Z743 Need for continuous supervision: Secondary | ICD-10-CM | POA: Diagnosis not present

## 2023-08-25 DIAGNOSIS — I700 Atherosclerosis of aorta: Secondary | ICD-10-CM | POA: Diagnosis not present

## 2023-08-25 DIAGNOSIS — R531 Weakness: Secondary | ICD-10-CM | POA: Diagnosis not present

## 2023-08-25 DIAGNOSIS — I771 Stricture of artery: Secondary | ICD-10-CM | POA: Diagnosis not present

## 2023-08-25 DIAGNOSIS — U071 COVID-19: Secondary | ICD-10-CM | POA: Diagnosis not present

## 2023-08-25 LAB — URINALYSIS, ROUTINE W REFLEX MICROSCOPIC
Bilirubin Urine: NEGATIVE
Glucose, UA: NEGATIVE mg/dL
Hgb urine dipstick: NEGATIVE
Ketones, ur: NEGATIVE mg/dL
Leukocytes,Ua: NEGATIVE
Nitrite: NEGATIVE
Protein, ur: NEGATIVE mg/dL
Specific Gravity, Urine: 1.005 (ref 1.005–1.030)
pH: 7 (ref 5.0–8.0)

## 2023-08-25 LAB — CBC WITH DIFFERENTIAL/PLATELET
Abs Immature Granulocytes: 0.46 10*3/uL — ABNORMAL HIGH (ref 0.00–0.07)
Basophils Absolute: 0.1 10*3/uL (ref 0.0–0.1)
Basophils Relative: 0 %
Eosinophils Absolute: 0.1 10*3/uL (ref 0.0–0.5)
Eosinophils Relative: 0 %
HCT: 39.4 % (ref 36.0–46.0)
Hemoglobin: 13 g/dL (ref 12.0–15.0)
Immature Granulocytes: 3 %
Lymphocytes Relative: 12 %
Lymphs Abs: 1.9 10*3/uL (ref 0.7–4.0)
MCH: 28.6 pg (ref 26.0–34.0)
MCHC: 33 g/dL (ref 30.0–36.0)
MCV: 86.6 fL (ref 80.0–100.0)
Monocytes Absolute: 2.2 10*3/uL — ABNORMAL HIGH (ref 0.1–1.0)
Monocytes Relative: 14 %
Neutro Abs: 11.1 10*3/uL — ABNORMAL HIGH (ref 1.7–7.7)
Neutrophils Relative %: 71 %
Platelets: 294 10*3/uL (ref 150–400)
RBC: 4.55 MIL/uL (ref 3.87–5.11)
RDW: 13.3 % (ref 11.5–15.5)
WBC: 15.7 10*3/uL — ABNORMAL HIGH (ref 4.0–10.5)
nRBC: 0 % (ref 0.0–0.2)

## 2023-08-25 LAB — BASIC METABOLIC PANEL
Anion gap: 9 (ref 5–15)
BUN: 18 mg/dL (ref 8–23)
CO2: 25 mmol/L (ref 22–32)
Calcium: 8.4 mg/dL — ABNORMAL LOW (ref 8.9–10.3)
Chloride: 100 mmol/L (ref 98–111)
Creatinine, Ser: 0.58 mg/dL (ref 0.44–1.00)
GFR, Estimated: >60 mL/min (ref >60)
Glucose, Bld: 93 mg/dL (ref 70–99)
Potassium: 3.3 mmol/L — ABNORMAL LOW (ref 3.5–5.1)
Sodium: 134 mmol/L — ABNORMAL LOW (ref 135–145)

## 2023-08-25 MED ORDER — POTASSIUM CHLORIDE CRYS ER 20 MEQ PO TBCR
20.0000 meq | EXTENDED_RELEASE_TABLET | Freq: Once | ORAL | Status: AC
  Administered 2023-08-25: 20 meq via ORAL
  Filled 2023-08-25: qty 1

## 2023-08-25 NOTE — Discharge Instructions (Signed)
You were seen today for feeling weak.  Your potassium was slightly low, you did have an elevated white blood cell, but no source of infection.  We gave you potassium here.  Make sure you stay hydrated at home and follow-up closely with your primary care doctor.  If you develop fever, vomiting, pain or other worrisome symptoms come back to the ER

## 2023-08-25 NOTE — ED Provider Notes (Addendum)
  Physical Exam  BP (!) 175/72 (BP Location: Left Arm)   Pulse (!) 56   Temp 98.7 F (37.1 C) (Oral)   Resp 20   Ht 5\' 3"  (1.6 m)   Wt 64.4 kg   SpO2 97%   BMI 25.15 kg/m   Physical Exam Vitals and nursing note reviewed.  Constitutional:      General: She is not in acute distress.    Appearance: She is well-developed.  HENT:     Head: Normocephalic and atraumatic.     Mouth/Throat:     Mouth: Mucous membranes are moist.  Eyes:     Conjunctiva/sclera: Conjunctivae normal.  Cardiovascular:     Rate and Rhythm: Normal rate and regular rhythm.     Heart sounds: No murmur heard. Pulmonary:     Effort: Pulmonary effort is normal. No respiratory distress.     Breath sounds: Normal breath sounds.  Abdominal:     Palpations: Abdomen is soft.     Tenderness: There is no abdominal tenderness.  Musculoskeletal:        General: No swelling.     Cervical back: Neck supple.  Skin:    General: Skin is warm and dry.     Capillary Refill: Capillary refill takes less than 2 seconds.  Neurological:     General: No focal deficit present.     Mental Status: She is alert and oriented to person, place, and time.  Psychiatric:        Mood and Affect: Mood normal.     Procedures  Procedures  ED Course / MDM    Medical Decision Making Amount and/or Complexity of Data Reviewed Labs: ordered. Radiology: ordered.  Risk Prescription drug management.  This patient's care was assumed by me at shift change. The tests pending are chest Xray. Plan is for discharge after results.   Patient was re-evaluated by me as well. I discussed their result with them and plan is for discharge  Patient came in for generalized weakness, has been ongoing since she had COVID-19 a couple of weeks ago.  No feversNo vomiting or abdominal pain, denies skin infections.  She did have a leukocytosis but no source of infection found on exam today.  Patient is well-appearing, was able to ambulate to the bathroom  and back by herself.  Advised on PCP follow-up and strict return precautions.       Ma Rings, PA-C 08/25/23 2049    Carmel Sacramento A, PA-C 08/25/23 2050    Sloan Leiter, DO 08/29/23 2351

## 2023-08-25 NOTE — ED Triage Notes (Signed)
Pt. BIB [facility] complaining of weakness. Covid positive 2 weeks ago and says "I haven't been right since."

## 2023-08-25 NOTE — ED Provider Notes (Signed)
[facility] AT [facility] Provider Note   CSN: [id number] Arrival date & time: 08/25/23  1426     History  Chief Complaint  Patient presents with   Weakness    Kelsey Ruiz is a 89 y.o. female history of PE on Eliquis, hypothyroidism, recent hospitalization for COVID presented for weakness and wanted to receive IV fluids.  Patient was released from the hospital 1/2 to 2 weeks ago and states since then she has felt generally weak but is been eating and drinking okay.  Patient has any chest pain, nausea vomiting, shortness of breath, change in sensation is motor skills, fevers.  Patient felt that she needed IV fluids and called EMS to be evaluated.  Home Medications Prior to Admission medications   Medication Sig Start Date End Date Taking? Authorizing Provider  ALPRAZolam Prudy Feeler) 0.5 MG tablet Take 0.5 mg by mouth 2 (two) times daily as needed. 12/09/21   [provider]  apixaban (ELIQUIS) 5 MG TABS tablet Take 2 tablets (10 mg total) by mouth 2 (two) times daily for 7 days, THEN 1 tablet (5 mg total) 2 (two) times daily. 02/18/22 08/16/23  Zigmund Daniel., MD  atorvastatin (LIPITOR) 80 MG tablet Take 80 mg by mouth daily.  08/17/18   [provider]  Calcium-Magnesium-Zinc (CAL-MAG-ZINC PO) Take 1 tablet by mouth every morning.     [provider]  cholecalciferol (VITAMIN D) 1000 UNITS tablet Take 1,000 Units by mouth every morning.     [provider]  clotrimazole-betamethasone (LOTRISONE) cream Apply topically. 08/04/23   [provider]  Coenzyme Q10 200 MG capsule Take 200 mg by mouth every morning.     [provider]  Cyanocobalamin (VITAMIN B-12 CR) 1500 MCG TBCR Take 1 tablet by mouth daily.    [provider]  dexamethasone (DECADRON) 4 MG tablet Take 1 tablet (4 mg total) by mouth daily for 8 days. 08/18/23 08/26/23  Johnson, Clanford L, MD  diltiazem (CARDIZEM CD) 180 MG 24 hr  capsule Take 180 mg by mouth See admin instructions. Take 2 capsules with evening meal 08/17/19   [provider]  furosemide (LASIX) 40 MG tablet Take 40 mg by mouth daily as needed. 08/25/19   [provider]  gabapentin (NEURONTIN) 100 MG capsule Take 100 mg by mouth 2 (two) times daily. 08/25/19   [provider]  HYDROcodone-acetaminophen (NORCO) 7.5-325 MG tablet Take 1 tablet by mouth every 6 (six) hours. 01/29/22   [provider]  Lactobacillus (ACIDOPHILUS) 100 MG CAPS Take 1 capsule by mouth daily.    [provider]  levothyroxine (SYNTHROID) 100 MCG tablet Take 100 mcg by mouth daily. 12/30/21   [provider]  omeprazole (PRILOSEC) 40 MG capsule Take 40 mg by mouth every morning.     [provider]  ondansetron (ZOFRAN ODT) 4 MG disintegrating tablet Take 1 tablet (4 mg total) by mouth every 8 (eight) hours as needed for nausea or vomiting. 11/17/18   Burgess Amor, PA-C  potassium chloride SA (KLOR-CON M) 20 MEQ tablet Take 20 mEq by mouth 2 (two) times daily as needed. 07/26/23   [provider]  vitamin C (ASCORBIC ACID) 500 MG tablet Take 1,000 mg by mouth every morning.     [provider]      Allergies    Sulfa antibiotics    Review of Systems   Review of Systems  Neurological:  Positive for weakness.  Physical Exam Updated Vital Signs BP (!) 175/72 (BP Location: Left Arm)   Pulse (!) 56   Temp 98.7 F (37.1 C) (Oral)   Resp 20   Ht 5\' 3"  (1.6 m)   Wt 64.4 kg   SpO2 97%   BMI 25.15 kg/m  Physical Exam Vitals reviewed.  Constitutional:      General: She is not in acute distress. HENT:     Head: Normocephalic and atraumatic.  Eyes:     Extraocular Movements: Extraocular movements intact.     Conjunctiva/sclera: Conjunctivae normal.     Pupils: Pupils are equal, round, and reactive to light.  Cardiovascular:     Rate and Rhythm: Regular rhythm. Bradycardia present.     Pulses:  Normal pulses.     Heart sounds: Normal heart sounds.     Comments: 2+ bilateral radial/dorsalis pedis pulses with slightly slower rate Pulmonary:     Effort: Pulmonary effort is normal. No respiratory distress.     Breath sounds: Normal breath sounds.  Abdominal:     Palpations: Abdomen is soft.     Tenderness: There is no abdominal tenderness. There is no guarding or rebound.  Musculoskeletal:        General: Normal range of motion.     Cervical back: Normal range of motion and neck supple.     Comments: 5 out of 5 bilateral grip/leg extension strength  Skin:    General: Skin is warm and dry.     Capillary Refill: Capillary refill takes less than 2 seconds.  Neurological:     General: No focal deficit present.     Mental Status: She is alert and oriented to person, place, and time.     Comments: Sensation intact in all 4 limbs  Psychiatric:        Mood and Affect: Mood normal.     ED Results / Procedures / Treatments   Labs (all labs ordered are listed, but only abnormal results are displayed) Labs Reviewed - No data to display  EKG None  Radiology No results found.  Procedures Procedures    Medications Ordered in ED Medications - No data to display  ED Course/ Medical Decision Making/ A&P                                 Medical Decision Making  Glori Bickers 89 y.o. presented today for wearing IV fluids. Working DDx that I considered at this time includes, but not limited to, dehydration, electrolyte abnormalities, anemia, PNA.  R/o DDx: pending  Review of prior external notes: 08/15/2023 discharge summary  Unique Tests and My Interpretation:  CBC: Leukocytosis 15.9 BMP: Hypokalemia 3.3 UA: Unremarkable CXR: pending  Discussion with Independent Historian: None  Discussion of Management of Tests: None  Risk: Low: based on diagnostic testing/clinical impression and treatment plan  Risk Stratification Score: none  Plan: On exam patient was in  no acute distress with stable vitals.  I walked in the room patient immediately started saying that she wanted to be discharged and go home.  When I asked the patient about why this is the case patient stated that she came here for IV fluids and a nurse told her she did not need IV fluids and now she wants to be discharged.  Did physical exam which was ultimately unremarkable and I spoke to the patient.  Patient was to be discharged soon as possible but was  in agreement to get very basic labs including a urine, CBC, BMP.  Patient did not want any other labs drawn.  I spoke to the patient extensively about how this may result in a missed diagnosis leading to complications and patient with full decision made capacity so that she understood the risks and only wants to have very basic labs as said earlier to be drawn.  His labs will be ordered and patient will most likely be discharged pending any abnormalities.  Patient's labs came back significant for leukocytosis of 15.7.  This is new from when patient was discharged 9 days ago.  This could be due to patient's COVID infection recently however I spoke with the patient and she agreed to have a chest x-ray done to ensure there was no pneumonia.  Patient was also noted to be hypokalemia and will be given a potassium pill as well.  Patient signed out to [facility], [place].  Please review their note for the continuation of patient's care.  The plan at this point is discharge after CXR as anticipate patient's symptoms are COVID related.          Final Clinical Impression(s) / ED Diagnoses Final diagnoses:  None    Rx / DC Orders ED Discharge Orders     None         Remi Deter 08/25/23 1858    Sloan Leiter, DO 08/29/23 2351

## 2023-10-18 DIAGNOSIS — C44719 Basal cell carcinoma of skin of left lower limb, including hip: Secondary | ICD-10-CM | POA: Diagnosis not present

## 2023-10-25 DIAGNOSIS — G894 Chronic pain syndrome: Secondary | ICD-10-CM | POA: Diagnosis not present

## 2023-11-24 DIAGNOSIS — E039 Hypothyroidism, unspecified: Secondary | ICD-10-CM | POA: Diagnosis not present

## 2023-11-24 DIAGNOSIS — E559 Vitamin D deficiency, unspecified: Secondary | ICD-10-CM | POA: Diagnosis not present

## 2023-11-24 DIAGNOSIS — M1991 Primary osteoarthritis, unspecified site: Secondary | ICD-10-CM | POA: Diagnosis not present

## 2023-11-24 DIAGNOSIS — E538 Deficiency of other specified B group vitamins: Secondary | ICD-10-CM | POA: Diagnosis not present

## 2023-11-24 DIAGNOSIS — G894 Chronic pain syndrome: Secondary | ICD-10-CM | POA: Diagnosis not present

## 2023-11-24 DIAGNOSIS — I10 Essential (primary) hypertension: Secondary | ICD-10-CM | POA: Diagnosis not present

## 2023-11-24 DIAGNOSIS — I4891 Unspecified atrial fibrillation: Secondary | ICD-10-CM | POA: Diagnosis not present

## 2023-11-24 DIAGNOSIS — E7849 Other hyperlipidemia: Secondary | ICD-10-CM | POA: Diagnosis not present

## 2023-11-24 DIAGNOSIS — E782 Mixed hyperlipidemia: Secondary | ICD-10-CM | POA: Diagnosis not present

## 2023-11-24 DIAGNOSIS — R7303 Prediabetes: Secondary | ICD-10-CM | POA: Diagnosis not present

## 2023-12-21 DIAGNOSIS — G894 Chronic pain syndrome: Secondary | ICD-10-CM | POA: Diagnosis not present

## 2024-01-21 DIAGNOSIS — G894 Chronic pain syndrome: Secondary | ICD-10-CM | POA: Diagnosis not present

## 2024-01-28 DIAGNOSIS — E039 Hypothyroidism, unspecified: Secondary | ICD-10-CM | POA: Diagnosis not present

## 2024-02-17 DIAGNOSIS — G894 Chronic pain syndrome: Secondary | ICD-10-CM | POA: Diagnosis not present

## 2024-03-20 DIAGNOSIS — G894 Chronic pain syndrome: Secondary | ICD-10-CM | POA: Diagnosis not present

## 2024-04-12 DIAGNOSIS — D6869 Other thrombophilia: Secondary | ICD-10-CM | POA: Diagnosis not present

## 2024-04-12 DIAGNOSIS — E782 Mixed hyperlipidemia: Secondary | ICD-10-CM | POA: Diagnosis not present

## 2024-04-19 DIAGNOSIS — G894 Chronic pain syndrome: Secondary | ICD-10-CM | POA: Diagnosis not present

## 2024-05-17 ENCOUNTER — Other Ambulatory Visit: Payer: Self-pay

## 2024-05-17 ENCOUNTER — Emergency Department (HOSPITAL_COMMUNITY)

## 2024-05-17 ENCOUNTER — Inpatient Hospital Stay (HOSPITAL_COMMUNITY)
Admission: EM | Admit: 2024-05-17 | Discharge: 2024-05-20 | DRG: 392 | Disposition: A | Discharge status: Home Health | Attending: Family Medicine | Admitting: Family Medicine

## 2024-05-17 DIAGNOSIS — Z7901 Long term (current) use of anticoagulants: Secondary | ICD-10-CM | POA: Diagnosis not present

## 2024-05-17 DIAGNOSIS — Z90710 Acquired absence of both cervix and uterus: Secondary | ICD-10-CM

## 2024-05-17 DIAGNOSIS — E871 Hypo-osmolality and hyponatremia: Secondary | ICD-10-CM | POA: Diagnosis present

## 2024-05-17 DIAGNOSIS — K5792 Diverticulitis of intestine, part unspecified, without perforation or abscess without bleeding: Secondary | ICD-10-CM | POA: Diagnosis present

## 2024-05-17 DIAGNOSIS — I480 Paroxysmal atrial fibrillation: Secondary | ICD-10-CM | POA: Diagnosis not present

## 2024-05-17 DIAGNOSIS — E039 Hypothyroidism, unspecified: Secondary | ICD-10-CM | POA: Diagnosis not present

## 2024-05-17 DIAGNOSIS — R112 Nausea with vomiting, unspecified: Secondary | ICD-10-CM | POA: Diagnosis not present

## 2024-05-17 DIAGNOSIS — Z882 Allergy status to sulfonamides status: Secondary | ICD-10-CM

## 2024-05-17 DIAGNOSIS — E860 Dehydration: Principal | ICD-10-CM | POA: Diagnosis present

## 2024-05-17 DIAGNOSIS — Z86711 Personal history of pulmonary embolism: Secondary | ICD-10-CM

## 2024-05-17 DIAGNOSIS — R109 Unspecified abdominal pain: Secondary | ICD-10-CM | POA: Diagnosis not present

## 2024-05-17 DIAGNOSIS — K5732 Diverticulitis of large intestine without perforation or abscess without bleeding: Principal | ICD-10-CM | POA: Diagnosis present

## 2024-05-17 DIAGNOSIS — I700 Atherosclerosis of aorta: Secondary | ICD-10-CM | POA: Diagnosis not present

## 2024-05-17 DIAGNOSIS — Z79899 Other long term (current) drug therapy: Secondary | ICD-10-CM | POA: Diagnosis not present

## 2024-05-17 DIAGNOSIS — I10 Essential (primary) hypertension: Secondary | ICD-10-CM | POA: Diagnosis present

## 2024-05-17 DIAGNOSIS — E782 Mixed hyperlipidemia: Secondary | ICD-10-CM | POA: Diagnosis not present

## 2024-05-17 DIAGNOSIS — Z87891 Personal history of nicotine dependence: Secondary | ICD-10-CM

## 2024-05-17 DIAGNOSIS — E876 Hypokalemia: Secondary | ICD-10-CM | POA: Diagnosis not present

## 2024-05-17 DIAGNOSIS — Z79890 Hormone replacement therapy (postmenopausal): Secondary | ICD-10-CM

## 2024-05-17 DIAGNOSIS — K5730 Diverticulosis of large intestine without perforation or abscess without bleeding: Secondary | ICD-10-CM | POA: Diagnosis not present

## 2024-05-17 DIAGNOSIS — R6889 Other general symptoms and signs: Secondary | ICD-10-CM | POA: Diagnosis not present

## 2024-05-17 DIAGNOSIS — K219 Gastro-esophageal reflux disease without esophagitis: Secondary | ICD-10-CM | POA: Diagnosis present

## 2024-05-17 DIAGNOSIS — R1111 Vomiting without nausea: Secondary | ICD-10-CM | POA: Diagnosis not present

## 2024-05-17 DIAGNOSIS — Z87440 Personal history of urinary (tract) infections: Secondary | ICD-10-CM

## 2024-05-17 DIAGNOSIS — R110 Nausea: Secondary | ICD-10-CM | POA: Diagnosis not present

## 2024-05-17 DIAGNOSIS — K529 Noninfective gastroenteritis and colitis, unspecified: Secondary | ICD-10-CM | POA: Diagnosis present

## 2024-05-17 LAB — COMPREHENSIVE METABOLIC PANEL WITH GFR
ALT: 11 U/L (ref 0–44)
AST: 16 U/L (ref 15–41)
Albumin: 3.9 g/dL (ref 3.5–5.0)
Alkaline Phosphatase: 39 U/L (ref 38–126)
Anion gap: 11 (ref 5–15)
BUN: 12 mg/dL (ref 8–23)
CO2: 23 mmol/L (ref 22–32)
Calcium: 9.1 mg/dL (ref 8.9–10.3)
Chloride: 98 mmol/L (ref 98–111)
Creatinine, Ser: 0.66 mg/dL (ref 0.44–1.00)
GFR, Estimated: >60 mL/min (ref >60)
Glucose, Bld: 118 mg/dL — ABNORMAL HIGH (ref 70–99)
Potassium: 2.9 mmol/L — ABNORMAL LOW (ref 3.5–5.1)
Sodium: 132 mmol/L — ABNORMAL LOW (ref 135–145)
Total Bilirubin: 1.1 mg/dL (ref 0.0–1.2)
Total Protein: 7 g/dL (ref 6.5–8.1)

## 2024-05-17 LAB — URINALYSIS, ROUTINE W REFLEX MICROSCOPIC
Bacteria, UA: NONE SEEN
Bilirubin Urine: NEGATIVE
Glucose, UA: NEGATIVE mg/dL
Ketones, ur: 5 mg/dL — AB
Leukocytes,Ua: NEGATIVE
Nitrite: NEGATIVE
Protein, ur: NEGATIVE mg/dL
Specific Gravity, Urine: 1.004 — ABNORMAL LOW (ref 1.005–1.030)
pH: 7 (ref 5.0–8.0)

## 2024-05-17 LAB — C DIFFICILE QUICK SCREEN W PCR REFLEX
C Diff antigen: NEGATIVE
C Diff interpretation: NOT DETECTED
C Diff toxin: NEGATIVE

## 2024-05-17 LAB — LIPASE, BLOOD: Lipase: 24 U/L (ref 11–51)

## 2024-05-17 LAB — CBC WITH DIFFERENTIAL/PLATELET
Abs Immature Granulocytes: 0.04 10*3/uL (ref 0.00–0.07)
Basophils Absolute: 0 10*3/uL (ref 0.0–0.1)
Basophils Relative: 0 %
Eosinophils Absolute: 0 10*3/uL (ref 0.0–0.5)
Eosinophils Relative: 0 %
HCT: 41.9 % (ref 36.0–46.0)
Hemoglobin: 13.8 g/dL (ref 12.0–15.0)
Immature Granulocytes: 0 %
Lymphocytes Relative: 8 %
Lymphs Abs: 1 10*3/uL (ref 0.7–4.0)
MCH: 28.2 pg (ref 26.0–34.0)
MCHC: 32.9 g/dL (ref 30.0–36.0)
MCV: 85.5 fL (ref 80.0–100.0)
Monocytes Absolute: 1.1 10*3/uL — ABNORMAL HIGH (ref 0.1–1.0)
Monocytes Relative: 9 %
Neutro Abs: 9.5 10*3/uL — ABNORMAL HIGH (ref 1.7–7.7)
Neutrophils Relative %: 83 %
Platelets: 286 10*3/uL (ref 150–400)
RBC: 4.9 MIL/uL (ref 3.87–5.11)
RDW: 13.3 % (ref 11.5–15.5)
WBC: 11.7 10*3/uL — ABNORMAL HIGH (ref 4.0–10.5)
nRBC: 0 % (ref 0.0–0.2)

## 2024-05-17 LAB — MAGNESIUM: Magnesium: 2.1 mg/dL (ref 1.7–2.4)

## 2024-05-17 MED ORDER — APIXABAN 5 MG PO TABS
5.0000 mg | ORAL_TABLET | Freq: Two times a day (BID) | ORAL | Status: DC
  Administered 2024-05-17 – 2024-05-20 (×6): 5 mg via ORAL
  Filled 2024-05-17 (×6): qty 1

## 2024-05-17 MED ORDER — IOHEXOL 300 MG/ML  SOLN
100.0000 mL | Freq: Once | INTRAMUSCULAR | Status: AC | PRN
Start: 2024-05-17 — End: 2024-05-17
  Administered 2024-05-17: 100 mL via INTRAVENOUS

## 2024-05-17 MED ORDER — SODIUM CHLORIDE 0.9 % IV SOLN
2.0000 g | INTRAVENOUS | Status: DC
  Administered 2024-05-17 – 2024-05-19 (×3): 2 g via INTRAVENOUS
  Filled 2024-05-17 (×3): qty 20

## 2024-05-17 MED ORDER — SODIUM CHLORIDE 0.9 % IV BOLUS
1000.0000 mL | Freq: Once | INTRAVENOUS | Status: AC
  Administered 2024-05-17: 1000 mL via INTRAVENOUS

## 2024-05-17 MED ORDER — MORPHINE SULFATE (PF) 2 MG/ML IV SOLN: 2.0000 mg | Freq: Once | INTRAVENOUS | Status: DC

## 2024-05-17 MED ORDER — ATORVASTATIN CALCIUM 40 MG PO TABS
40.0000 mg | ORAL_TABLET | Freq: Every day | ORAL | Status: DC
  Administered 2024-05-18 – 2024-05-20 (×3): 40 mg via ORAL
  Filled 2024-05-17 (×3): qty 1

## 2024-05-17 MED ORDER — LEVOTHYROXINE SODIUM 88 MCG PO TABS
88.0000 ug | ORAL_TABLET | Freq: Every day | ORAL | Status: DC
  Administered 2024-05-18 – 2024-05-20 (×3): 88 ug via ORAL
  Filled 2024-05-17 (×3): qty 1

## 2024-05-17 MED ORDER — DILTIAZEM HCL ER COATED BEADS 180 MG PO CP24
360.0000 mg | ORAL_CAPSULE | Freq: Every day | ORAL | Status: DC
  Administered 2024-05-17 – 2024-05-20 (×4): 360 mg via ORAL
  Filled 2024-05-17 (×4): qty 2

## 2024-05-17 MED ORDER — ALPRAZOLAM 0.5 MG PO TABS
0.5000 mg | ORAL_TABLET | Freq: Two times a day (BID) | ORAL | Status: DC | PRN
  Administered 2024-05-18 (×2): 0.5 mg via ORAL
  Filled 2024-05-17 (×2): qty 1

## 2024-05-17 MED ORDER — GABAPENTIN 100 MG PO CAPS
100.0000 mg | ORAL_CAPSULE | Freq: Two times a day (BID) | ORAL | Status: DC
  Administered 2024-05-17 – 2024-05-20 (×6): 100 mg via ORAL
  Filled 2024-05-17 (×6): qty 1

## 2024-05-17 MED ORDER — HYDRALAZINE HCL 20 MG/ML IJ SOLN: 5.0000 mg | Freq: Four times a day (QID) | INTRAMUSCULAR | Status: DC | PRN

## 2024-05-17 MED ORDER — POTASSIUM CHLORIDE 10 MEQ/100ML IV SOLN
10.0000 meq | INTRAVENOUS | Status: DC
  Administered 2024-05-17: 10 meq via INTRAVENOUS
  Filled 2024-05-17: qty 100

## 2024-05-17 MED ORDER — PIPERACILLIN-TAZOBACTAM 3.375 G IVPB 30 MIN: 3.3750 g | Freq: Once | INTRAVENOUS | Status: DC

## 2024-05-17 MED ORDER — VITAMIN B-12 1000 MCG PO TABS
1000.0000 ug | ORAL_TABLET | Freq: Every day | ORAL | Status: DC
  Administered 2024-05-18 – 2024-05-20 (×3): 1000 ug via ORAL
  Filled 2024-05-17 (×3): qty 1

## 2024-05-17 MED ORDER — LACTATED RINGERS IV SOLN: INTRAVENOUS | Status: DC

## 2024-05-17 MED ORDER — MORPHINE SULFATE (PF) 2 MG/ML IV SOLN: 2.0000 mg | INTRAVENOUS | Status: DC | PRN

## 2024-05-17 MED ORDER — METRONIDAZOLE 500 MG/100ML IV SOLN
500.0000 mg | Freq: Two times a day (BID) | INTRAVENOUS | Status: DC
  Administered 2024-05-17 – 2024-05-20 (×6): 500 mg via INTRAVENOUS
  Filled 2024-05-17 (×6): qty 100

## 2024-05-17 MED ORDER — HYDROCODONE-ACETAMINOPHEN 5-325 MG PO TABS
1.0000 | ORAL_TABLET | Freq: Four times a day (QID) | ORAL | Status: DC | PRN
  Administered 2024-05-17 – 2024-05-20 (×4): 1 via ORAL
  Filled 2024-05-17 (×5): qty 1

## 2024-05-17 MED ORDER — ONDANSETRON HCL 4 MG/2ML IJ SOLN
4.0000 mg | Freq: Once | INTRAMUSCULAR | Status: AC
  Administered 2024-05-17: 4 mg via INTRAVENOUS
  Filled 2024-05-17: qty 2

## 2024-05-17 MED ORDER — POTASSIUM CHLORIDE CRYS ER 20 MEQ PO TBCR
40.0000 meq | EXTENDED_RELEASE_TABLET | Freq: Four times a day (QID) | ORAL | Status: AC
  Administered 2024-05-17 – 2024-05-18 (×2): 40 meq via ORAL
  Filled 2024-05-17 (×2): qty 2

## 2024-05-17 MED ORDER — ACETAMINOPHEN 325 MG PO TABS
650.0000 mg | ORAL_TABLET | Freq: Four times a day (QID) | ORAL | Status: DC | PRN
Start: 2024-05-17 — End: 2024-05-20
  Administered 2024-05-19: 650 mg via ORAL
  Filled 2024-05-17: qty 2

## 2024-05-17 MED ORDER — ACETAMINOPHEN 650 MG RE SUPP: 650.0000 mg | Freq: Four times a day (QID) | RECTAL | Status: DC | PRN

## 2024-05-17 NOTE — ED Triage Notes (Signed)
 Pt arrived via [facility] c/o N/V/D x Sunday, generalized weakness, states she cannot keep anything down.

## 2024-05-17 NOTE — Progress Notes (Signed)
   05/17/24 2233  TOC Brief Assessment  Insurance and Status Reviewed  Patient has primary care physician Yes  Home environment has been reviewed From home  Prior level of function: Independent  Prior/Current Home Services No current home services  Social Drivers of Health Review SDOH reviewed no interventions necessary  Readmission risk has been reviewed Yes  Transition of care needs no transition of care needs at this time   Transition of Care Department [facility]) has reviewed patient and will continue to monitor. PT eval pending.

## 2024-05-17 NOTE — ED Notes (Signed)
 Pt unable to provide urine sample at this time

## 2024-05-17 NOTE — ED Notes (Signed)
 Patient transported to CT

## 2024-05-17 NOTE — H&P (Signed)
 [facility] H&P   Patient Demographics:    Kelsey  Ruiz, is a 89 y.o. female  MRN: [id number]   DOB - 06-30-1929  Admit Date - 05/17/2024  Outpatient Primary MD for the patient is Minus Amel, MD  Referring MD/NP/PA: Dr. Scarlette Currier  Patient coming from: Home  Chief Complaint  Patient presents with   Emesis      HPI:    Kelsey  Ruiz  is a 89 y.o. female, with medical history significant of atrial fibrillation, hypertension, hyperlipidemia, hyperlipidemia, pulmonary embolism, hypertension - Patient presents to ED secondary to complaints of abdominal pain, nausea, vomiting and diarrhea, symptoms ongoing for last 3 days, started on Sunday, she denies any sick contacts, but daughter at bedside reports she has herself some GI symptoms, denies any coffee-ground emesis, any hematochezia, fever, but some chills, given lack of improvement she came to ED. - In ED sodium low at 132, potassium at 2.9, and white blood cell count elevated at 11.7, CT abdomen pelvis significant for acute diverticulitis versus colitis, [facility] hospitalist consulted to admit.  Review of systems:      A full 10 point Review of Systems was done, except as stated above, all other Review of Systems were negative.   With Past History of the following :    Past Medical History:  Diagnosis Date   Arthritis    GERD (gastroesophageal reflux disease)    Glaucoma    Hx of bladder infections    Hyperlipemia    Hypertension    Hypothyroidism       Past Surgical History:  Procedure Laterality Date   ABDOMINAL HYSTERECTOMY     BREAST BIOPSY Right 04/11/2014   Procedure: RIGHT BREAST BIOPSY AFTER NEEDLE LOCALIZATION;  Surgeon: Beau Bound, MD;  Location: [facility];  Service: General;  Laterality: Right;  NEEDLE LOC @ 8:00   BREAST LUMPECTOMY Right    BREAST LUMPECTOMY Right    BREAST SURGERY Bilateral    benign cysts    CESAREAN SECTION     laser eyes Bilateral    for glaucoma   THYROIDECTOMY        Social History:     Social History   Tobacco Use   Smoking status: Former    Current packs/day: 0.00    Average packs/day: 0.5 packs/day for 10.0 years (5.0 ttl pk-yrs)    Types: Cigarettes    Start date: 04/05/1974    Quit date: 04/05/1984    Years since quitting: 40.1   Smokeless tobacco: Never  Substance Use Topics   Alcohol use: No        Family History :     Family History  Problem Relation Age of Onset   Breast cancer Neg Hx      Home Medications:   Prior to Admission medications   Medication Sig Start Date End Date Taking? Authorizing Provider  levothyroxine  (SYNTHROID ) 88 MCG tablet  Take 88 mcg by mouth daily. 02/01/24  Yes [provider]  ALPRAZolam  (XANAX ) 0.5 MG tablet Take 0.5 mg by mouth 2 (two) times daily as needed. 12/09/21   [provider]  apixaban  (ELIQUIS ) 5 MG TABS tablet Take 2 tablets (10 mg total) by mouth 2 (two) times daily for 7 days, THEN 1 tablet (5 mg total) 2 (two) times daily. 02/18/22 08/16/23  Etter Hermann., MD  atorvastatin  (LIPITOR) 80 MG tablet Take 80 mg by mouth daily.  08/17/18   [provider]  Calcium -Magnesium -Zinc (CAL-MAG-ZINC PO) Take 1 tablet by mouth every morning.     [provider]  cholecalciferol (VITAMIN D) 1000 UNITS tablet Take 1,000 Units by mouth every morning.     [provider]  ciprofloxacin (CIPRO) 500 MG tablet Take 500 mg by mouth 2 (two) times daily. 05/17/24   [provider]  clotrimazole-betamethasone (LOTRISONE) cream Apply topically. 08/04/23   [provider]  Coenzyme Q10 200 MG capsule Take 200 mg by mouth every morning.     [provider]  Cyanocobalamin (VITAMIN B-12 CR) 1500 MCG TBCR Take 1 tablet by mouth daily.    [provider]  diltiazem  (CARDIZEM  CD) 180 MG 24 hr capsule Take 180 mg by mouth See admin instructions. Take 2  capsules with evening meal 08/17/19   [provider]  furosemide  (LASIX ) 40 MG tablet Take 40 mg by mouth daily as needed. 08/25/19   [provider]  gabapentin  (NEURONTIN ) 100 MG capsule Take 100 mg by mouth 2 (two) times daily. 08/25/19   [provider]  HYDROcodone -acetaminophen  (NORCO) 7.5-325 MG tablet Take 1 tablet by mouth every 6 (six) hours. 01/29/22   [provider]  Lactobacillus (ACIDOPHILUS) 100 MG CAPS Take 1 capsule by mouth daily.    [provider]  omeprazole (PRILOSEC) 40 MG capsule Take 40 mg by mouth every morning.     [provider]  ondansetron  (ZOFRAN  ODT) 4 MG disintegrating tablet Take 1 tablet (4 mg total) by mouth every 8 (eight) hours as needed for nausea or vomiting. 11/17/18   Idol, Julie, PA-C  potassium chloride  SA (KLOR-CON  M) 20 MEQ tablet Take 20 mEq by mouth 2 (two) times daily as needed. 07/26/23   [provider]  vitamin C (ASCORBIC ACID) 500 MG tablet Take 1,000 mg by mouth every morning.     [provider]     Allergies:     Allergies  Allergen Reactions   Sulfa  Antibiotics Nausea And Vomiting     Physical Exam:   Vitals  Blood pressure (!) 135/94, pulse 98, temperature 98.9 F (37.2 C), temperature source Oral, resp. rate 18, SpO2 96%.   1. General Frail elderly female, laying in bed, in no apparent distress  2. Normal affect and insight, Not Suicidal or Homicidal, Awake Alert, Oriented X 3.  3. No F.N deficits, ALL C.Nerves Intact, Strength 5/5 all 4 extremities, Sensation intact all 4 extremities, Plantars down going.  4. Ears and Eyes appear Normal, Conjunctivae clear, PERRLA. Moist Oral Mucosa.  5. Supple Neck, No JVD, No cervical lymphadenopathy appriciated, No Carotid Bruits.  6. Symmetrical Chest wall movement, Good air movement bilaterally, CTAB.  7. regular, but tachycardic no Gallops, Rubs or Murmurs, No Parasternal Heave.  8. Positive Bowel Sounds,  Abdomen Soft, diffuse tenderness, No organomegaly appriciated,No rebound -guarding or rigidity.  9.  No Cyanosis, slow skin Turgor, No Skin Rash or Bruise.  10. Good muscle tone,  joints appear normal , no effusions, Normal ROM.     Data Review:    CBC Recent Labs  Lab 05/17/24 1814  WBC 11.7*  HGB 13.8  HCT 41.9  PLT 286  MCV 85.5  MCH 28.2  MCHC 32.9  RDW 13.3  LYMPHSABS 1.0  MONOABS 1.1*  EOSABS 0.0  BASOSABS 0.0   ------------------------------------------------------------------------------------------------------------------  Chemistries  Recent Labs  Lab 05/17/24 1814  NA 132*  K 2.9*  CL 98  CO2 23  GLUCOSE 118*  BUN 12  CREATININE 0.66  CALCIUM  9.1  MG 2.1  AST 16  ALT 11  ALKPHOS 39  BILITOT 1.1   ------------------------------------------------------------------------------------------------------------------ CrCl cannot be calculated (Unknown ideal weight.). ------------------------------------------------------------------------------------------------------------------ No results for input(s): "TSH", "T4TOTAL", "T3FREE", "THYROIDAB" in the last 72 hours.  Invalid input(s): "FREET3"  Coagulation profile No results for input(s): "INR", "PROTIME" in the last 168 hours. ------------------------------------------------------------------------------------------------------------------- No results for input(s): "DDIMER" in the last 72 hours. -------------------------------------------------------------------------------------------------------------------  Cardiac Enzymes No results for input(s): "CKMB", "TROPONINI", "MYOGLOBIN" in the last 168 hours.  Invalid input(s): "CK" ------------------------------------------------------------------------------------------------------------------    Component Value Date/Time   BNP 112.0 (H) 02/18/2022 0851      ---------------------------------------------------------------------------------------------------------------  Urinalysis    Component Value Date/Time   COLORURINE STRAW (A) 05/17/2024 1849   APPEARANCEUR CLEAR 05/17/2024 1849   LABSPEC 1.004 (L) 05/17/2024 1849   PHURINE 7.0 05/17/2024 1849   GLUCOSEU NEGATIVE 05/17/2024 1849   HGBUR SMALL (A) 05/17/2024 1849   BILIRUBINUR NEGATIVE 05/17/2024 1849   KETONESUR 5 (A) 05/17/2024 1849   PROTEINUR NEGATIVE 05/17/2024 1849   UROBILINOGEN 0.2 07/07/2014 1129   NITRITE NEGATIVE 05/17/2024 1849   LEUKOCYTESUR NEGATIVE 05/17/2024 1849    ----------------------------------------------------------------------------------------------------------------   Imaging Results:    CT ABDOMEN PELVIS W CONTRAST Result Date: 05/17/2024 CLINICAL DATA:  Generalized weakness, nausea/vomiting/diarrhea, abdominal pain EXAM: CT ABDOMEN AND PELVIS WITH CONTRAST TECHNIQUE: Multidetector CT imaging of the abdomen and pelvis was performed using the standard protocol following bolus administration of intravenous contrast. RADIATION DOSE REDUCTION: This exam was performed according to the departmental dose-optimization program which includes automated exposure control, adjustment of the mA and/or kV according to patient size and/or use of iterative reconstruction technique. CONTRAST:  OMNIPAQUE  IOHEXOL  300 MG/ML  SOLN COMPARISON:  11/17/2018 FINDINGS: Lower chest: No acute pleural or parenchymal lung disease. Hepatobiliary: No focal liver abnormality is seen. No gallstones, gallbladder wall thickening, or biliary dilatation. Pancreas: Unremarkable. No pancreatic ductal dilatation or surrounding inflammatory changes. Spleen: Normal in size without focal abnormality. Adrenals/Urinary Tract: Adrenal glands are unremarkable. Kidneys are normal, without renal calculi, focal lesion, or hydronephrosis. Bladder is unremarkable. Stomach/Bowel: No bowel obstruction or  ileus. The appendix is not well visualized. Diffuse colonic diverticulosis, with segmental wall thickening from the distal descending colon through the sigmoid colon compatible with either acute diverticulitis or colitis. No perforation, fluid collection, or abscess. Vascular/Lymphatic: Aortic atherosclerosis. No enlarged abdominal or pelvic lymph nodes. Reproductive: Status post hysterectomy. No adnexal masses. No free fluid or free intraperitoneal Other: Gas.  No abdominal wall hernia. Musculoskeletal: No acute or destructive bony abnormalities. Chronic L4 compression deformity unchanged. Reconstructed images demonstrate no additional findings. IMPRESSION: 1. Segmental wall thickening from the distal descending colon through the sigmoid colon, consistent with acute diverticulitis versus colitis. No perforation, fluid collection, or abscess. 2.  Aortic Atherosclerosis (ICD10-I70.0). Electronically Signed   By: Bobbye Burrow M.D.   On: 05/17/2024 19:27       Assessment & Plan:    Principal Problem:  Acute diverticulitis Active Problems:   Hypothyroidism   Mixed hyperlipidemia   GERD without esophagitis   Abdominal pain/nausea/vomiting/diarrhea Acute diverticulitis versus acute colitis Dehydration -Symptoms ongoing for last 3 days, CT abdomen pelvis significant for acute diverticulitis versus colitis -Clinically dehydrated, unable to tolerate oral intake, so she will be admitted -Continue with IV fluids. - As needed pain medicine. - She received 1 dose of IV Zosyn in ED, will continue Rocephin  and Flagyl -Will obtain GI panel -Will start on clear liquid diet and advance as tolerated  Hyponatremia -due to volume depletion and dehydration, continue with IV fluids  Hypokalemia - Replaced, recheck in a.m.  Weakness, deconditioning - Due to above, will consult PT  Parox atrial fibrillation -Continue with Cardizem , currently normal sinus rhythm-continue with Eliquis  for  anticoagulation  History of PE - She remains on Eliquis    Essential hypertension Continue with Cardizem  and as needed hydralazine  Mixed hyperlipidemia Continue Lipitor   GERD Continue Protonix     DVT Prophylaxis on Eliquis   AM Labs Ordered, also please review Full Orders  Family Communication: Admission, patients condition and plan of care including tests being ordered have been discussed with the patient and daughter at bedside who indicate understanding and agree with the plan and Code Status.  Code Status full code  Likely DC to home  Consults called: None  Admission status: Observation  Time spent in minutes : 70 minutes   Seena Dadds M.D on 05/17/2024 at 8:08 PM   [facility] - Office  [phone]

## 2024-05-17 NOTE — ED Notes (Signed)
Attending rounding at bedside °

## 2024-05-17 NOTE — ED Provider Notes (Signed)
 [place] EMERGENCY DEPARTMENT AT [facility] Provider Note   CSN: [id number] Arrival date & time: 05/17/24  1739     History  Chief Complaint  Patient presents with   Emesis    Kelsey  B Ruiz is a 89 y.o. female.  Pt is a 89 yo female with pmhx significant for htn, glaucoma, gerd, hypothyroidism, hld, and arthritis.  Pt said she developed n/v/d on 6/1 and has not gotten better.  She's been unable to keep anything down. She has diffuse abd pain.       Home Medications Prior to Admission medications   Medication Sig Start Date End Date Taking? Authorizing Provider  levothyroxine  (SYNTHROID ) 88 MCG tablet Take 88 mcg by mouth daily. 02/01/24  Yes [provider]  ALPRAZolam  (XANAX ) 0.5 MG tablet Take 0.5 mg by mouth 2 (two) times daily as needed. 12/09/21   [provider]  apixaban  (ELIQUIS ) 5 MG TABS tablet Take 2 tablets (10 mg total) by mouth 2 (two) times daily for 7 days, THEN 1 tablet (5 mg total) 2 (two) times daily. 02/18/22 08/16/23  Etter Hermann., MD  atorvastatin  (LIPITOR) 80 MG tablet Take 80 mg by mouth daily.  08/17/18   [provider]  Calcium -Magnesium -Zinc (CAL-MAG-ZINC PO) Take 1 tablet by mouth every morning.     [provider]  cholecalciferol (VITAMIN D) 1000 UNITS tablet Take 1,000 Units by mouth every morning.     [provider]  ciprofloxacin (CIPRO) 500 MG tablet Take 500 mg by mouth 2 (two) times daily. 05/17/24   [provider]  clotrimazole-betamethasone (LOTRISONE) cream Apply topically. 08/04/23   [provider]  Coenzyme Q10 200 MG capsule Take 200 mg by mouth every morning.     [provider]  Cyanocobalamin (VITAMIN B-12 CR) 1500 MCG TBCR Take 1 tablet by mouth daily.    [provider]  diltiazem  (CARDIZEM  CD) 180 MG 24 hr capsule Take 180 mg by mouth See admin instructions. Take 2 capsules with evening meal 08/17/19   [provider]   furosemide  (LASIX ) 40 MG tablet Take 40 mg by mouth daily as needed. 08/25/19   [provider]  gabapentin  (NEURONTIN ) 100 MG capsule Take 100 mg by mouth 2 (two) times daily. 08/25/19   [provider]  HYDROcodone -acetaminophen  (NORCO) 7.5-325 MG tablet Take 1 tablet by mouth every 6 (six) hours. 01/29/22   [provider]  Lactobacillus (ACIDOPHILUS) 100 MG CAPS Take 1 capsule by mouth daily.    [provider]  omeprazole (PRILOSEC) 40 MG capsule Take 40 mg by mouth every morning.     [provider]  ondansetron  (ZOFRAN  ODT) 4 MG disintegrating tablet Take 1 tablet (4 mg total) by mouth every 8 (eight) hours as needed for nausea or vomiting. 11/17/18   Idol, Kaylor Maiers, PA-C  potassium chloride  SA (KLOR-CON  M) 20 MEQ tablet Take 20 mEq by mouth 2 (two) times daily as needed. 07/26/23   [provider]  vitamin C (ASCORBIC ACID) 500 MG tablet Take 1,000 mg by mouth every morning.     [provider]      Allergies    Sulfa  antibiotics    Review of Systems   Review of Systems  Gastrointestinal:  Positive for abdominal pain, diarrhea, nausea and vomiting.  All other systems reviewed and are negative.   Physical Exam Updated Vital Signs BP (!) 135/94   Pulse 98   Temp 98.9 F (37.2 C) (Oral)  Resp 18   SpO2 96%  Physical Exam Vitals and nursing note reviewed.  Constitutional:      Appearance: Normal appearance.  HENT:     Head: Normocephalic and atraumatic.     Right Ear: External ear normal.     Left Ear: External ear normal.     Nose: Nose normal.     Mouth/Throat:     Mouth: Mucous membranes are dry.  Eyes:     Extraocular Movements: Extraocular movements intact.     Conjunctiva/sclera: Conjunctivae normal.     Pupils: Pupils are equal, round, and reactive to light.  Cardiovascular:     Rate and Rhythm: Regular rhythm. Tachycardia present.     Pulses: Normal pulses.     Heart sounds: Normal heart sounds.   Pulmonary:     Effort: Pulmonary effort is normal.     Breath sounds: Normal breath sounds.  Abdominal:     General: Abdomen is flat. Bowel sounds are normal.     Palpations: Abdomen is soft.     Tenderness: There is generalized abdominal tenderness.  Musculoskeletal:        General: Normal range of motion.     Cervical back: Normal range of motion and neck supple.  Skin:    General: Skin is warm.     Capillary Refill: Capillary refill takes less than 2 seconds.  Neurological:     General: No focal deficit present.     Mental Status: She is alert and oriented to person, place, and time.  Psychiatric:        Mood and Affect: Mood normal.        Behavior: Behavior normal.     ED Results / Procedures / Treatments   Labs (all labs ordered are listed, but only abnormal results are displayed) Labs Reviewed  CBC WITH DIFFERENTIAL/PLATELET - Abnormal; Notable for the following components:      Result Value   WBC 11.7 (*)    Neutro Abs 9.5 (*)    Monocytes Absolute 1.1 (*)    All other components within normal limits  COMPREHENSIVE METABOLIC PANEL WITH GFR - Abnormal; Notable for the following components:   Sodium 132 (*)    Potassium 2.9 (*)    Glucose, Bld 118 (*)    All other components within normal limits  URINALYSIS, ROUTINE W REFLEX MICROSCOPIC - Abnormal; Notable for the following components:   Color, Urine STRAW (*)    Specific Gravity, Urine 1.004 (*)    Hgb urine dipstick SMALL (*)    Ketones, ur 5 (*)    All other components within normal limits  GASTROINTESTINAL PANEL BY PCR, STOOL (REPLACES STOOL CULTURE)  C DIFFICILE QUICK SCREEN W PCR REFLEX    CULTURE, BLOOD (ROUTINE X 2)  CULTURE, BLOOD (ROUTINE X 2)  LIPASE, BLOOD  MAGNESIUM   BASIC METABOLIC PANEL WITH GFR  CBC    EKG None  Radiology CT ABDOMEN PELVIS W CONTRAST Result Date: 05/17/2024 CLINICAL DATA:  Generalized weakness, nausea/vomiting/diarrhea, abdominal pain EXAM: CT ABDOMEN AND PELVIS WITH  CONTRAST TECHNIQUE: Multidetector CT imaging of the abdomen and pelvis was performed using the standard protocol following bolus administration of intravenous contrast. RADIATION DOSE REDUCTION: This exam was performed according to the departmental dose-optimization program which includes automated exposure control, adjustment of the mA and/or kV according to patient size and/or use of iterative reconstruction technique. CONTRAST:  OMNIPAQUE  IOHEXOL  300 MG/ML  SOLN COMPARISON:  11/17/2018 FINDINGS: Lower chest: No acute pleural or parenchymal lung  disease. Hepatobiliary: No focal liver abnormality is seen. No gallstones, gallbladder wall thickening, or biliary dilatation. Pancreas: Unremarkable. No pancreatic ductal dilatation or surrounding inflammatory changes. Spleen: Normal in size without focal abnormality. Adrenals/Urinary Tract: Adrenal glands are unremarkable. Kidneys are normal, without renal calculi, focal lesion, or hydronephrosis. Bladder is unremarkable. Stomach/Bowel: No bowel obstruction or ileus. The appendix is not well visualized. Diffuse colonic diverticulosis, with segmental wall thickening from the distal descending colon through the sigmoid colon compatible with either acute diverticulitis or colitis. No perforation, fluid collection, or abscess. Vascular/Lymphatic: Aortic atherosclerosis. No enlarged abdominal or pelvic lymph nodes. Reproductive: Status post hysterectomy. No adnexal masses. No free fluid or free intraperitoneal Other: Gas.  No abdominal wall hernia. Musculoskeletal: No acute or destructive bony abnormalities. Chronic L4 compression deformity unchanged. Reconstructed images demonstrate no additional findings. IMPRESSION: 1. Segmental wall thickening from the distal descending colon through the sigmoid colon, consistent with acute diverticulitis versus colitis. No perforation, fluid collection, or abscess. 2.  Aortic Atherosclerosis (ICD10-I70.0). Electronically Signed    By: Bobbye Burrow M.D.   On: 05/17/2024 19:27    Procedures Procedures    Medications Ordered in ED Medications  acetaminophen  (TYLENOL ) tablet 650 mg (has no administration in time range)    Or  acetaminophen  (TYLENOL ) suppository 650 mg (has no administration in time range)  HYDROcodone -acetaminophen  (NORCO/VICODIN) 5-325 MG per tablet 1 tablet (has no administration in time range)  morphine  (PF) 2 MG/ML injection 2 mg (has no administration in time range)  potassium chloride  SA (KLOR-CON  M) CR tablet 40 mEq (has no administration in time range)  lactated ringers  infusion (has no administration in time range)  cefTRIAXone  (ROCEPHIN ) 2 g in sodium chloride  0.9 % 100 mL IVPB (has no administration in time range)  metroNIDAZOLE (FLAGYL) IVPB 500 mg (has no administration in time range)  sodium chloride  0.9 % bolus 1,000 mL (0 mLs Intravenous Stopped 05/17/24 1859)  ondansetron  (ZOFRAN ) injection 4 mg (4 mg Intravenous Given 05/17/24 1819)  iohexol  (OMNIPAQUE ) 300 MG/ML solution 100 mL (100 mLs Intravenous Contrast Given 05/17/24 1911)    ED Course/ Medical Decision Making/ A&P                                 Medical Decision Making Amount and/or Complexity of Data Reviewed Labs: ordered. Radiology: ordered.  Risk Prescription drug management. Decision regarding hospitalization.   This patient presents to the ED for concern of abd pain and n/v/d, this involves an extensive number of treatment options, and is a complaint that carries with it a high risk of complications and morbidity.  The differential diagnosis includes gastroenteritis, colitis, diverticulitis   Co morbidities that complicate the patient evaluation  htn, glaucoma, gerd, hypothyroidism, hld, and arthritis   Additional history obtained:  Additional history obtained from epic chart review External records from outside source obtained and reviewed including EMS report   Lab Tests:  I Ordered, and personally  interpreted labs.  The pertinent results include:  cbc with wbc sl elevated at 11.7, cmp nl other than k low at 2.9, mg nl, lip nl   Imaging Studies ordered:  I ordered imaging studies including ct abd/pelvis  I independently visualized and interpreted imaging which showed  Segmental wall thickening from the distal descending colon  through the sigmoid colon, consistent with acute diverticulitis  versus colitis. No perforation, fluid collection, or abscess.  2.  Aortic Atherosclerosis (ICD10-I70.0).   I agree  with the radiologist interpretation   Cardiac Monitoring:  The patient was maintained on a cardiac monitor.  I personally viewed and interpreted the cardiac monitored which showed an underlying rhythm of: st initially, now nsr   Medicines ordered and prescription drug management:  I ordered medication including ivfs/zofran   for sx  Reevaluation of the patient after these medicines showed that the patient improved I have reviewed the patients home medicines and have made adjustments as needed   Test Considered:  ct   Critical Interventions:  Iv abx   Consultations Obtained:  I requested consultation with the hospitalist (Dr. Osborne Blazer),  and discussed lab and imaging findings as well as pertinent plan -he will admit   Problem List / ED Course:  Hypokalemia:  pt given iv kcl.  Likely from the diarrhea. Colitis vs diverticulitis:  pt started on iv zosyn as she is high risk. Dehydration:  improved with ivfs   Reevaluation:  After the interventions noted above, I reevaluated the patient and found that they have :improved   Social Determinants of Health:  Lives at home   Dispostion:  After consideration of the diagnostic results and the patients response to treatment, I feel that the patent would benefit from admission.          Final Clinical Impression(s) / ED Diagnoses Final diagnoses:  Dehydration  Nausea vomiting and diarrhea  Colitis   Hypokalemia    Rx / DC Orders ED Discharge Orders     None         Sueellen Emery, MD 05/17/24 2013

## 2024-05-18 ENCOUNTER — Encounter (HOSPITAL_COMMUNITY): Payer: Self-pay | Admitting: Internal Medicine

## 2024-05-18 DIAGNOSIS — E876 Hypokalemia: Secondary | ICD-10-CM | POA: Diagnosis not present

## 2024-05-18 DIAGNOSIS — Z90710 Acquired absence of both cervix and uterus: Secondary | ICD-10-CM | POA: Diagnosis not present

## 2024-05-18 DIAGNOSIS — E860 Dehydration: Secondary | ICD-10-CM | POA: Diagnosis present

## 2024-05-18 DIAGNOSIS — E871 Hypo-osmolality and hyponatremia: Secondary | ICD-10-CM | POA: Diagnosis present

## 2024-05-18 DIAGNOSIS — E039 Hypothyroidism, unspecified: Secondary | ICD-10-CM | POA: Diagnosis not present

## 2024-05-18 DIAGNOSIS — Z86711 Personal history of pulmonary embolism: Secondary | ICD-10-CM | POA: Diagnosis not present

## 2024-05-18 DIAGNOSIS — Z7901 Long term (current) use of anticoagulants: Secondary | ICD-10-CM | POA: Diagnosis not present

## 2024-05-18 DIAGNOSIS — E782 Mixed hyperlipidemia: Secondary | ICD-10-CM | POA: Diagnosis not present

## 2024-05-18 DIAGNOSIS — I10 Essential (primary) hypertension: Secondary | ICD-10-CM | POA: Diagnosis present

## 2024-05-18 DIAGNOSIS — Z79899 Other long term (current) drug therapy: Secondary | ICD-10-CM | POA: Diagnosis not present

## 2024-05-18 DIAGNOSIS — I480 Paroxysmal atrial fibrillation: Secondary | ICD-10-CM | POA: Diagnosis present

## 2024-05-18 DIAGNOSIS — Z79890 Hormone replacement therapy (postmenopausal): Secondary | ICD-10-CM | POA: Diagnosis not present

## 2024-05-18 DIAGNOSIS — K5732 Diverticulitis of large intestine without perforation or abscess without bleeding: Secondary | ICD-10-CM | POA: Diagnosis present

## 2024-05-18 DIAGNOSIS — K219 Gastro-esophageal reflux disease without esophagitis: Secondary | ICD-10-CM | POA: Diagnosis present

## 2024-05-18 DIAGNOSIS — K529 Noninfective gastroenteritis and colitis, unspecified: Secondary | ICD-10-CM | POA: Diagnosis present

## 2024-05-18 DIAGNOSIS — Z87891 Personal history of nicotine dependence: Secondary | ICD-10-CM | POA: Diagnosis not present

## 2024-05-18 DIAGNOSIS — K5792 Diverticulitis of intestine, part unspecified, without perforation or abscess without bleeding: Secondary | ICD-10-CM | POA: Diagnosis not present

## 2024-05-18 DIAGNOSIS — Z87440 Personal history of urinary (tract) infections: Secondary | ICD-10-CM | POA: Diagnosis not present

## 2024-05-18 DIAGNOSIS — Z882 Allergy status to sulfonamides status: Secondary | ICD-10-CM | POA: Diagnosis not present

## 2024-05-18 DIAGNOSIS — I700 Atherosclerosis of aorta: Secondary | ICD-10-CM | POA: Diagnosis present

## 2024-05-18 LAB — PHOSPHORUS: Phosphorus: 2.8 mg/dL (ref 2.5–4.6)

## 2024-05-18 LAB — BASIC METABOLIC PANEL WITH GFR
Anion gap: 10 (ref 5–15)
BUN: 9 mg/dL (ref 8–23)
CO2: 23 mmol/L (ref 22–32)
Calcium: 7.9 mg/dL — ABNORMAL LOW (ref 8.9–10.3)
Chloride: 106 mmol/L (ref 98–111)
Creatinine, Ser: 0.56 mg/dL (ref 0.44–1.00)
GFR, Estimated: >60 mL/min (ref >60)
Glucose, Bld: 101 mg/dL — ABNORMAL HIGH (ref 70–99)
Potassium: 3.4 mmol/L — ABNORMAL LOW (ref 3.5–5.1)
Sodium: 139 mmol/L (ref 135–145)

## 2024-05-18 LAB — CBC
HCT: 37.3 % (ref 36.0–46.0)
Hemoglobin: 12.3 g/dL (ref 12.0–15.0)
MCH: 29.1 pg (ref 26.0–34.0)
MCHC: 33 g/dL (ref 30.0–36.0)
MCV: 88.2 fL (ref 80.0–100.0)
Platelets: 270 10*3/uL (ref 150–400)
RBC: 4.23 MIL/uL (ref 3.87–5.11)
RDW: 13.7 % (ref 11.5–15.5)
WBC: 9.5 10*3/uL (ref 4.0–10.5)
nRBC: 0 % (ref 0.0–0.2)

## 2024-05-18 MED ORDER — POTASSIUM CHLORIDE 10 MEQ/100ML IV SOLN
10.0000 meq | INTRAVENOUS | Status: AC
  Administered 2024-05-18 (×4): 10 meq via INTRAVENOUS
  Filled 2024-05-18 (×4): qty 100

## 2024-05-18 MED ORDER — LACTATED RINGERS IV SOLN: INTRAVENOUS | Status: AC

## 2024-05-18 MED ORDER — ZINC OXIDE 40 % EX OINT
TOPICAL_OINTMENT | Freq: Three times a day (TID) | CUTANEOUS | Status: DC
  Administered 2024-05-18: 1 via TOPICAL
  Filled 2024-05-18: qty 57

## 2024-05-18 MED ORDER — POTASSIUM CHLORIDE CRYS ER 20 MEQ PO TBCR
20.0000 meq | EXTENDED_RELEASE_TABLET | Freq: Once | ORAL | Status: AC
  Administered 2024-05-18: 20 meq via ORAL
  Filled 2024-05-18: qty 1

## 2024-05-18 MED ORDER — MORPHINE SULFATE (PF) 2 MG/ML IV SOLN
1.0000 mg | INTRAVENOUS | Status: DC | PRN
  Administered 2024-05-18 – 2024-05-19 (×2): 1 mg via INTRAVENOUS
  Filled 2024-05-18 (×2): qty 1

## 2024-05-18 MED ORDER — POTASSIUM CHLORIDE 10 MEQ/100ML IV SOLN: 10.0000 meq | INTRAVENOUS | Status: DC

## 2024-05-18 NOTE — Hospital Course (Signed)
 89 y.o. female, with medical history significant of atrial fibrillation, hypertension, hyperlipidemia, hyperlipidemia, pulmonary embolism, hypertension - Patient presents to ED secondary to complaints of abdominal pain, nausea, vomiting and diarrhea, symptoms ongoing for last 3 days, started on Sunday, she denies any sick contacts, but daughter at bedside reports she has herself some GI symptoms, denies any coffee-ground emesis, any hematochezia, fever, but some chills, given lack of improvement she came to ED. - In ED sodium low at 132, potassium at 2.9, and white blood cell count elevated at 11.7, CT abdomen pelvis significant for acute diverticulitis versus colitis, [facility] hospitalist consulted to admit.

## 2024-05-18 NOTE — Progress Notes (Signed)
 PROGRESS NOTE   Kelsey  JOHNESHA Ruiz  ZOX:[id number] DOB: 10/03/1929 DOA: 05/17/2024 PCP: Minus Amel, MD   Chief Complaint  Patient presents with   Emesis   Level of care: Med-Surg  Brief Admission History:   89 y.o. female, with medical history significant of atrial fibrillation, hypertension, hyperlipidemia, hyperlipidemia, pulmonary embolism, hypertension - Patient presents to ED secondary to complaints of abdominal pain, nausea, vomiting and diarrhea, symptoms ongoing for last 3 days, started on Sunday, she denies any sick contacts, but daughter at bedside reports she has herself some GI symptoms, denies any coffee-ground emesis, any hematochezia, fever, but some chills, given lack of improvement she came to ED. - In ED sodium low at 132, potassium at 2.9, and white blood cell count elevated at 11.7, CT abdomen pelvis significant for acute diverticulitis versus colitis, [facility] hospitalist consulted to admit.   Assessment and Plan:  Abdominal pain/nausea/vomiting/diarrhea Acute diverticulitis versus acute colitis Dehydration -Symptoms ongoing for last 3 days, CT abdomen pelvis significant for acute diverticulitis versus colitis -Clinically dehydrated, unable to tolerate oral intake, so she will be admitted -Continue with IV fluids. - As needed pain medicine. - She received 1 dose of IV Zosyn in ED, will continue Rocephin  and Flagyl - GI panel pending -continue clear liquid diet and advance as tolerated   Hyponatremia -resolved with IV fluids   Hypokalemia - additional repletion ordered recheck in AM   Weakness, deconditioning - Due to above, will consult PT   Parox atrial fibrillation -Continue with Cardizem , currently normal sinus rhythm-continue with Eliquis  for anticoagulation   History of PE - She remains on Eliquis     Essential hypertension Continue with Cardizem  and as needed hydralazine   Mixed hyperlipidemia Continue Lipitor   GERD Continue Protonix   DVT  prophylaxis: apixaban  Code Status: Full  Family Communication:  Disposition: home in 1-2 days  Antimicrobials:  CTX 6/4>> Metron 6/4>>   Subjective: Pt still having tenderness in abdomen and doesn't want to eat or drink   Objective: Vitals:   05/18/24 0327 05/18/24 0612 05/18/24 0902 05/18/24 1415  BP: (!) 146/71 (!) 159/80 (!) 184/81 (!) 129/50  Pulse: 74 64 89 61  Resp: 17 16  20   Temp: 99.1 F (37.3 C) 98.1 F (36.7 C) 98.9 F (37.2 C) 98.7 F (37.1 C)  TempSrc: Oral Oral Oral Oral  SpO2:  96% 96% 95%  Weight: 58.6 kg     Height: 5\' 2"  (1.575 m)       Intake/Output Summary (Last 24 hours) at 05/18/2024 1641 Last data filed at 05/18/2024 1300 Gross per 24 hour  Intake 2643.26 ml  Output --  Net 2643.26 ml   Filed Weights   05/17/24 2143 05/18/24 0327  Weight: 58.6 kg 58.6 kg   Examination:  General exam: Appears calm and comfortable  Respiratory system: Clear to auscultation. Respiratory effort normal. Cardiovascular system: normal S1 & S2 heard. No JVD, murmurs, rubs, gallops or clicks. No pedal edema. Gastrointestinal system: Abdomen is nondistended, soft and epigastric tenderness. No organomegaly or masses felt. Normal bowel sounds heard. Central nervous system: Alert and oriented. No focal neurological deficits. Extremities: Symmetric 5 x 5 power. Skin: No rashes, lesions or ulcers. Psychiatry: Judgement and insight appear normal. Mood & affect appropriate.   Data Reviewed: I have personally reviewed following labs and imaging studies  CBC: Recent Labs  Lab 05/17/24 1814 05/18/24 0441  WBC 11.7* 9.5  NEUTROABS 9.5*  --   HGB 13.8 12.3  HCT 41.9 37.3  MCV 85.5  88.2  PLT 286 270    Basic Metabolic Panel: Recent Labs  Lab 05/17/24 1814 05/18/24 0441  NA 132* 139  K 2.9* 3.4*  CL 98 106  CO2 23 23  GLUCOSE 118* 101*  BUN 12 9  CREATININE 0.66 0.56  CALCIUM  9.1 7.9*  MG 2.1  --   PHOS  --  2.8    CBG: No results for input(s): "GLUCAP"  in the last 168 hours.  Recent Results (from the past 240 hours)  Culture, blood (routine x 2)     Status: None (Preliminary result)   Collection Time: 05/17/24  8:10 PM   Specimen: BLOOD  Result Value Ref Range Status   Specimen Description BLOOD RIGHT ANTECUBITAL  Final   Special Requests   Final    BOTTLES DRAWN AEROBIC AND ANAEROBIC Blood Culture results may not be optimal due to an inadequate volume of blood received in culture bottles   Culture   Final    NO GROWTH < 12 HOURS Performed at [facility], [address], [place], [place] [address]    Report Status PENDING  Incomplete  Culture, blood (routine x 2)     Status: None (Preliminary result)   Collection Time: 05/17/24  8:10 PM   Specimen: BLOOD  Result Value Ref Range Status   Specimen Description BLOOD BLOOD LEFT HAND  Final   Special Requests   Final    BOTTLES DRAWN AEROBIC ONLY Blood Culture results may not be optimal due to an inadequate volume of blood received in culture bottles   Culture   Final    NO GROWTH < 12 HOURS Performed at [facility], [address], [place], [place] [address]    Report Status PENDING  Incomplete  C Difficile Quick Screen w PCR reflex     Status: None   Collection Time: 05/17/24  8:30 PM   Specimen: Stool  Result Value Ref Range Status   C Diff antigen NEGATIVE NEGATIVE Final   C Diff toxin NEGATIVE NEGATIVE Final   C Diff interpretation No C. difficile detected.  Final    Comment: Performed at [facility], [address] [place], [place] [address]     Radiology Studies: CT ABDOMEN PELVIS W CONTRAST Result Date: 05/17/2024 CLINICAL DATA:  Generalized weakness, nausea/vomiting/diarrhea, abdominal pain EXAM: CT ABDOMEN AND PELVIS WITH CONTRAST TECHNIQUE: Multidetector CT imaging of the abdomen and pelvis was performed using the standard protocol following bolus administration of intravenous contrast. RADIATION DOSE REDUCTION: This exam was performed according to the departmental  dose-optimization program which includes automated exposure control, adjustment of the mA and/or kV according to patient size and/or use of iterative reconstruction technique. CONTRAST:  OMNIPAQUE  IOHEXOL  300 MG/ML  SOLN COMPARISON:  11/17/2018 FINDINGS: Lower chest: No acute pleural or parenchymal lung disease. Hepatobiliary: No focal liver abnormality is seen. No gallstones, gallbladder wall thickening, or biliary dilatation. Pancreas: Unremarkable. No pancreatic ductal dilatation or surrounding inflammatory changes. Spleen: Normal in size without focal abnormality. Adrenals/Urinary Tract: Adrenal glands are unremarkable. Kidneys are normal, without renal calculi, focal lesion, or hydronephrosis. Bladder is unremarkable. Stomach/Bowel: No bowel obstruction or ileus. The appendix is not well visualized. Diffuse colonic diverticulosis, with segmental wall thickening from the distal descending colon through the sigmoid colon compatible with either acute diverticulitis or colitis. No perforation, fluid collection, or abscess. Vascular/Lymphatic: Aortic atherosclerosis. No enlarged abdominal or pelvic lymph nodes. Reproductive: Status post hysterectomy. No adnexal masses. No free fluid or free intraperitoneal Other: Gas.  No  abdominal wall hernia. Musculoskeletal: No acute or destructive bony abnormalities. Chronic L4 compression deformity unchanged. Reconstructed images demonstrate no additional findings. IMPRESSION: 1. Segmental wall thickening from the distal descending colon through the sigmoid colon, consistent with acute diverticulitis versus colitis. No perforation, fluid collection, or abscess. 2.  Aortic Atherosclerosis (ICD10-I70.0). Electronically Signed   By: Bobbye Burrow M.D.   On: 05/17/2024 19:27    Scheduled Meds:  apixaban   5 mg Oral BID   atorvastatin   40 mg Oral Daily   cyanocobalamin  1,000 mcg Oral Daily   diltiazem   360 mg Oral Daily   gabapentin   100 mg Oral BID   levothyroxine    88 mcg Oral Daily   potassium chloride   20 mEq Oral Once   Continuous Infusions:  cefTRIAXone  (ROCEPHIN )  IV 200 mL/hr at 05/18/24 0428   lactated ringers  50 mL/hr at 05/18/24 1045   metronidazole 500 mg (05/18/24 0907)    LOS: 0 days   Time spent: 55 mins  Myesha Stillion Lincoln Renshaw, MD How to contact the [facility] Attending or Consulting provider 7A - 7P or covering provider during after hours 7P -7A, for this patient?  Check the care team in [facility] and look for a) attending/consulting [facility] provider listed and b) the [facility] team listed Log into [email or web address] to find provider on call.  Locate the [facility] provider you are looking for under [facility] and page to a number that you can be directly reached. If you still have difficulty reaching the provider, please page the [facility] (Director on Call) for the Hospitalists listed on amion for assistance.  05/18/2024, 4:41 PM

## 2024-05-18 NOTE — Plan of Care (Signed)
  Problem: Acute Rehab PT Goals(only PT should resolve) Goal: Pt Will Go Supine/Side To Sit Outcome: Progressing Flowsheets (Taken 05/18/2024 1555) Pt will go Supine/Side to Sit:  Independently  with modified independence Goal: Patient Will Transfer Sit To/From Stand Outcome: Progressing Flowsheets (Taken 05/18/2024 1555) Patient will transfer sit to/from stand:  with modified independence  with supervision Goal: Pt Will Transfer Bed To Chair/Chair To Bed Outcome: Progressing Flowsheets (Taken 05/18/2024 1555) Pt will Transfer Bed to Chair/Chair to Bed:  with modified independence  with supervision Goal: Pt Will Ambulate Outcome: Progressing Flowsheets (Taken 05/18/2024 1555) Pt will Ambulate:  75 feet  with modified independence  with supervision  with rolling walker   3:56 PM, 05/18/24 Walton Guppy, MPT Physical Therapist with [facility] 336 [phone] office [phone] mobile phone

## 2024-05-18 NOTE — Evaluation (Signed)
 Physical Therapy Evaluation Patient Details Name: Kelsey Ruiz MRN: [id number] DOB: 14-Aug-1929 Today's Date: 05/18/2024  History of Present Illness  Kelsey Ruiz  is a 89 y.o. female, with medical history significant of atrial fibrillation, hypertension, hyperlipidemia, hyperlipidemia, pulmonary embolism, hypertension  - Patient presents to ED secondary to complaints of abdominal pain, nausea, vomiting and diarrhea, symptoms ongoing for last 3 days, started on Sunday, she denies any sick contacts, but daughter at bedside reports she has herself some GI symptoms, denies any coffee-ground emesis, any hematochezia, fever, but some chills, given lack of improvement she came to ED.   Clinical Impression  Patient demonstrates good return for sitting p at bedside with [facility] partially raised, transferring to chair using RW and tolerated ambulating in room, hallway without loss of balance using RW. Patient tolerated sitting up in chair after therapy - nursing staff aware. Patient will benefit from continued skilled physical therapy in hospital and recommended venue below to increase strength, balance, endurance for safe ADLs and gait.          If plan is discharge home, recommend the following: A little help with walking and/or transfers;A little help with bathing/dressing/bathroom;Help with stairs or ramp for entrance;Assistance with cooking/housework   Can travel by private vehicle        Equipment Recommendations None recommended by PT  Recommendations for Other Services       Functional Status Assessment Patient has had a recent decline in their functional status and demonstrates the ability to make significant improvements in function in a reasonable and predictable amount of time.     Precautions / Restrictions Precautions Precautions: Fall Recall of Precautions/Restrictions: Intact Restrictions Weight Bearing Restrictions Per Provider Order: No      Mobility  Bed  Mobility Overal bed mobility: Modified Independent             General bed mobility comments: required HOB partially raised    Transfers Overall transfer level: Needs assistance Equipment used: Rolling walker (2 wheels) Transfers: Sit to/from Stand, Bed to chair/wheelchair/BSC Sit to Stand: Contact guard assist, Supervision   Step pivot transfers: Supervision, Contact guard assist       General transfer comment: increased time, labored movement    Ambulation/Gait Ambulation/Gait assistance: Supervision, Contact guard assist Gait Distance (Feet): 60 Feet Assistive device: Rolling walker (2 wheels) Gait Pattern/deviations: Decreased step length - right, Decreased step length - left, Decreased stride length, Trunk flexed Gait velocity: decreased     General Gait Details: slightly labored movement with good return for ambulating in room/hallway wtithout loss of balance and limited mostly due to fatigue and c/o incontinent of bowel  Stairs            Wheelchair Mobility     Tilt Bed    Modified Rankin (Stroke Patients Only)       Balance Overall balance assessment: Needs assistance Sitting-balance support: Feet supported, No upper extremity supported Sitting balance-Leahy Scale: Good Sitting balance - Comments: seated at EOB   Standing balance support: Reliant on assistive device for balance, During functional activity, Bilateral upper extremity supported Standing balance-Leahy Scale: Fair Standing balance comment: fair/good using RW                             Pertinent Vitals/Pain Pain Assessment Pain Assessment: Faces Faces Pain Scale: Hurts a little bit Pain Location: stomach Pain Descriptors / Indicators: Aching Pain Intervention(s): Limited activity within patient's tolerance, Monitored during session,  Repositioned    Home Living Family/patient expects to be discharged to:: Private residence Living Arrangements: Children Available  Help at Discharge: Family;Available 24 hours/day Type of Home: House Home Access: Stairs to enter Entrance Stairs-Rails: Right;Left;Can reach both Entrance Stairs-Number of Steps: 2   Home Layout: One level Home Equipment: Rollator (4 wheels);Grab bars - tub/shower;Wheelchair - manual;Grab bars - Geophysicist/field seismologist (2 wheels)      Prior Function Prior Level of Function : Needs assist       Physical Assist : Mobility (physical);ADLs (physical) Mobility (physical): Bed mobility;Transfers;Gait;Stairs   Mobility Comments: household ambulator using Rollator ADLs Comments: Assisted by family     Extremity/Trunk Assessment   Upper Extremity Assessment Upper Extremity Assessment: Overall WFL for tasks assessed    Lower Extremity Assessment Lower Extremity Assessment: Generalized weakness    Cervical / Trunk Assessment Cervical / Trunk Assessment: Kyphotic  Communication   Communication Communication: No apparent difficulties    Cognition Arousal: Alert Behavior During Therapy: WFL for tasks assessed/performed   PT - Cognitive impairments: No apparent impairments                         Following commands: Intact       Cueing Cueing Techniques: Verbal cues     General Comments      Exercises     Assessment/Plan    PT Assessment Patient needs continued PT services  PT Problem List Decreased strength;Decreased activity tolerance;Decreased balance;Decreased mobility       PT Treatment Interventions DME instruction;Gait training;Stair training;Functional mobility training;Therapeutic activities;Therapeutic exercise;Balance training;Patient/family education    PT Goals (Current goals can be found in the Care Plan section)  Acute Rehab PT Goals Patient Stated Goal: return home with family to assist PT Goal Formulation: With patient Time For Goal Achievement: 05/25/24 Potential to Achieve Goals: Good    Frequency Min 3X/week      Co-evaluation               AM-PAC PT "6 Clicks" Mobility  Outcome Measure Help needed turning from your back to your side while in a flat bed without using bedrails?: None Help needed moving from lying on your back to sitting on the side of a flat bed without using bedrails?: A Little Help needed moving to and from a bed to a chair (including a wheelchair)?: A Little Help needed standing up from a chair using your arms (e.g., wheelchair or bedside chair)?: A Little Help needed to walk in hospital room?: A Little Help needed climbing 3-5 steps with a railing? : A Lot 6 Click Score: 18    End of Session   Activity Tolerance: Patient tolerated treatment well;Patient limited by fatigue Patient left: in chair;with call bell/phone within reach Nurse Communication: Mobility status PT Visit Diagnosis: Unsteadiness on feet (R26.81);Other abnormalities of gait and mobility (R26.89);Muscle weakness (generalized) (M62.81)    Time: 1610-9604 PT Time Calculation (min) (ACUTE ONLY): 25 min   Charges:   PT Evaluation $PT Eval Moderate Complexity: 1 Mod PT Treatments $Therapeutic Activity: 23-37 mins PT General Charges $$ ACUTE PT VISIT: 1 Visit         3:54 PM, 05/18/24 Walton Guppy, MPT Physical Therapist with [facility] [phone] office [phone] mobile phone

## 2024-05-18 NOTE — Progress Notes (Signed)
 Mobility Specialist Progress Note:    05/18/24 1159  Mobility  Activity Transferred to/from [facility]  Assistive Device BSC  Distance Ambulated (ft) 2 ft  Range of Motion/Exercises Active;All extremities  Activity Response Tolerated well  Mobility Referral Yes  Mobility visit 1 Mobility  Mobility Specialist Start Time (ACUTE ONLY) 1144  Mobility Specialist Stop Time (ACUTE ONLY) 1159  Mobility Specialist Time Calculation (min) (ACUTE ONLY) 15 min   Pt received requesting assistance to [facility]. Required CGA to stand and transfer iwth no AD. Tolerated well, asx throughout. Returned supine, alarm on. All needs met.   Glinda Lapping Mobility Specialist Please contact via Special educational needs teacher or  Rehab office at [phone]

## 2024-05-18 NOTE — Plan of Care (Signed)

## 2024-05-19 DIAGNOSIS — K5792 Diverticulitis of intestine, part unspecified, without perforation or abscess without bleeding: Secondary | ICD-10-CM | POA: Diagnosis not present

## 2024-05-19 DIAGNOSIS — E039 Hypothyroidism, unspecified: Secondary | ICD-10-CM | POA: Diagnosis not present

## 2024-05-19 LAB — BASIC METABOLIC PANEL WITH GFR
Anion gap: 8 (ref 5–15)
BUN: 6 mg/dL — ABNORMAL LOW (ref 8–23)
CO2: 24 mmol/L (ref 22–32)
Calcium: 8.6 mg/dL — ABNORMAL LOW (ref 8.9–10.3)
Chloride: 107 mmol/L (ref 98–111)
Creatinine, Ser: 0.58 mg/dL (ref 0.44–1.00)
GFR, Estimated: >60 mL/min (ref >60)
Glucose, Bld: 97 mg/dL (ref 70–99)
Potassium: 3.5 mmol/L (ref 3.5–5.1)
Sodium: 139 mmol/L (ref 135–145)

## 2024-05-19 MED ORDER — VITAMIN C 500 MG PO TABS
1000.0000 mg | ORAL_TABLET | Freq: Every morning | ORAL | Status: DC
  Administered 2024-05-19 – 2024-05-20 (×2): 1000 mg via ORAL
  Filled 2024-05-19 (×2): qty 2

## 2024-05-19 MED ORDER — FLORANEX PO PACK
1.0000 g | PACK | Freq: Every day | ORAL | Status: DC
  Administered 2024-05-19 – 2024-05-20 (×2): 1 g via ORAL
  Filled 2024-05-19 (×2): qty 1

## 2024-05-19 NOTE — Progress Notes (Signed)
 Physical Therapy Treatment Patient Details Name: Kelsey Ruiz MRN: [id number] DOB: 1929/08/04 Today's Date: 05/19/2024   History of Present Illness Kelsey  Ruiz  is a 89 y.o. female, with medical history significant of atrial fibrillation, hypertension, hyperlipidemia, hyperlipidemia, pulmonary embolism, hypertension  - Patient presents to ED secondary to complaints of abdominal pain, nausea, vomiting and diarrhea, symptoms ongoing for last 3 days, started on Sunday, she denies any sick contacts, but daughter at bedside reports she has herself some GI symptoms, denies any coffee-ground emesis, any hematochezia, fever, but some chills, given lack of improvement she came to ED.    PT Comments  Pt sitting on EOB with daughter in room and willing to particiate with therapy today.  Pt limited by LBP which was monitored through session.  Pt able to ambulate with RW safe mechanics iwht no LOB, limited by slow cadence and reports of fatigue upon return to room.  Pt with need for bowel movement following gait.  EOS pt left in bed with reports of her back feels stretched out and less pain.  Left in bed with call bell within reach and daughter present in room.    If plan is discharge home, recommend the following:     Can travel by private vehicle        Equipment Recommendations       Recommendations for Other Services       Precautions / Restrictions Precautions Precautions: Fall Recall of Precautions/Restrictions: Intact Restrictions Weight Bearing Restrictions Per Provider Order: No     Mobility  Bed Mobility Overal bed mobility: Independent             General bed mobility comments: pt sitting on EOB upon entrance, able to get into bed with no assistance    Transfers Overall transfer level: Modified independent Equipment used: Rolling walker (2 wheels) Transfers: Sit to/from Stand, Bed to chair/wheelchair/BSC Sit to Stand: Contact guard assist, Supervision            General transfer comment: increased time, labored movement    Ambulation/Gait Ambulation/Gait assistance: Supervision, Contact guard assist Gait Distance (Feet): 75 Feet Assistive device: Rolling walker (2 wheels) Gait Pattern/deviations: Decreased step length - right, Decreased step length - left, Decreased stride length, Trunk flexed       General Gait Details: slightly labored movement with good return for ambulating in room/hallway wtithout loss of balance and limited mostly due to fatigue and c/o incontinent of bowel   Stairs             Wheelchair Mobility     Tilt Bed    Modified Rankin (Stroke Patients Only)       Balance                                            Communication    Cognition Arousal: Alert Behavior During Therapy: WFL for tasks assessed/performed   PT - Cognitive impairments: No apparent impairments                                Cueing    Exercises      General Comments        Pertinent Vitals/Pain Pain Assessment Pain Score: 6  Pain Location: back pain Pain Descriptors / Indicators: Aching, Sore Pain Intervention(s): Monitored during session, Limited activity  within patient's tolerance, Repositioned    Home Living                          Prior Function            PT Goals (current goals can now be found in the care plan section)      Frequency           PT Plan      Co-evaluation              AM-PAC PT "6 Clicks" Mobility   Outcome Measure  Help needed turning from your back to your side while in a flat bed without using bedrails?: None Help needed moving from lying on your back to sitting on the side of a flat bed without using bedrails?: A Little Help needed moving to and from a bed to a chair (including a wheelchair)?: A Little Help needed standing up from a chair using your arms (e.g., wheelchair or bedside chair)?: A Little Help needed to walk in  hospital room?: A Little Help needed climbing 3-5 steps with a railing? : A Lot 6 Click Score: 18    End of Session Equipment Utilized During Treatment: Gait belt Activity Tolerance: Patient tolerated treatment well;Patient limited by fatigue Patient left: with call bell/phone within reach;in bed;with family/visitor present Nurse Communication: Mobility status       Time: 1540-1600 PT Time Calculation (min) (ACUTE ONLY): 20 min  Charges:    $Therapeutic Activity: 23-37 mins PT General Charges $$ ACUTE PT VISIT: 1 Visit                     Minor Amble, LPTA/CLT; CBIS [phone]  Alesia Anchors 05/19/2024, 4:02 PM

## 2024-05-19 NOTE — Care Management Important Message (Signed)
 Important Message  Patient Details  Name: Kelsey Ruiz  LEOLIA VINZANT MRN: [id number] Date of Birth: 07-23-1929   Important Message Given:  Yes - Medicare IM     Hanan Moen L Shar Paez 05/19/2024, 2:38 PM

## 2024-05-19 NOTE — Progress Notes (Signed)
 PROGRESS NOTE   Kelsey Ruiz  ZOX:[id number] DOB: March 09, 1929 DOA: 05/17/2024 PCP: Minus Amel, MD   Chief Complaint  Patient presents with   Emesis   Level of care: Med-Surg  Brief Admission History:   89 y.o. female, with medical history significant of atrial fibrillation, hypertension, hyperlipidemia, hyperlipidemia, pulmonary embolism, hypertension - Patient presents to ED secondary to complaints of abdominal pain, nausea, vomiting and diarrhea, symptoms ongoing for last 3 days, started on Sunday, she denies any sick contacts, but daughter at bedside reports she has herself some GI symptoms, denies any coffee-ground emesis, any hematochezia, fever, but some chills, given lack of improvement she came to ED. - In ED sodium low at 132, potassium at 2.9, and white blood cell count elevated at 11.7, CT abdomen pelvis significant for acute diverticulitis versus colitis, [facility] hospitalist consulted to admit.   Assessment and Plan:  Abdominal pain/nausea/vomiting/diarrhea Acute diverticulitis versus acute colitis Dehydration -Symptoms ongoing for last 3 days prior to arrival, CT abdomen pelvis significant for acute diverticulitis versus colitis -Clinically dehydrated, unable to tolerate oral intake, so she will be admitted -Continue with IV fluids and IV antibiotics.  - As needed pain medicine. - She received 1 dose of IV Zosyn in ED, will continue Rocephin  and Flagyl - GI panel pending, C diff was negative - trying to advance diet but patient has not wanted to eat very much   Hyponatremia -resolved with IV fluids   Hypokalemia - repleted    Weakness, deconditioning - PT eval requested    Parox atrial fibrillation -Continue with Cardizem , currently normal sinus rhythm-continue with Eliquis  for anticoagulation   History of PE - She remains on Eliquis     Essential hypertension - Continue with Cardizem  and as needed hydralazine   Mixed hyperlipidemia Continue Lipitor    GERD Continue Protonix   DVT prophylaxis: apixaban  Code Status: Full  Family Communication:  Disposition: home tomorrow if feeling better and able to tolerate diet   Antimicrobials:  CTX 6/4>> Metron 6/4>>   Subjective: Pt says doesn't feel well, not much appetite, not wanting to try breakfast, does not feel strong enough for home.   Objective: Vitals:   05/18/24 2043 05/18/24 2044 05/19/24 0531 05/19/24 1011  BP: (!) 146/67 (!) 146/67 (!) 146/62 (!) 150/73  Pulse: 62 62 96 76  Resp: 20 20 16 15   Temp: 99 F (37.2 C) 99 F (37.2 C) 98.1 F (36.7 C) 98 F (36.7 C)  TempSrc: Oral Oral Oral Oral  SpO2: 96% 96% 96% 97%  Weight:      Height:        Intake/Output Summary (Last 24 hours) at 05/19/2024 1153 Last data filed at 05/19/2024 0916 Gross per 24 hour  Intake 2195.49 ml  Output --  Net 2195.49 ml   Filed Weights   05/17/24 2143 05/18/24 0327  Weight: 58.6 kg 58.6 kg   Examination:  General exam: Appears calm and comfortable  Respiratory system: Clear to auscultation. Respiratory effort normal. Cardiovascular system: normal S1 & S2 heard. No JVD, murmurs, rubs, gallops or clicks. No pedal edema. Gastrointestinal system: Abdomen is nondistended, soft and epigastric tenderness. No organomegaly or masses felt. Normal bowel sounds heard. Central nervous system: Alert and oriented. No focal neurological deficits. Extremities: Symmetric 5 x 5 power. Skin: No rashes, lesions or ulcers. Psychiatry: Judgement and insight appear normal. Mood & affect appropriate.   Data Reviewed: I have personally reviewed following labs and imaging studies  CBC: Recent Labs  Lab 05/17/24 1814 05/18/24  0441  WBC 11.7* 9.5  NEUTROABS 9.5*  --   HGB 13.8 12.3  HCT 41.9 37.3  MCV 85.5 88.2  PLT 286 270    Basic Metabolic Panel: Recent Labs  Lab 05/17/24 1814 05/18/24 0441 05/19/24 0529  NA 132* 139 139  K 2.9* 3.4* 3.5  CL 98 106 107  CO2 23 23 24   GLUCOSE 118* 101* 97   BUN 12 9 6*  CREATININE 0.66 0.56 0.58  CALCIUM  9.1 7.9* 8.6*  MG 2.1  --   --   PHOS  --  2.8  --     CBG: No results for input(s): "GLUCAP" in the last 168 hours.  Recent Results (from the past 240 hours)  Culture, blood (routine x 2)     Status: None (Preliminary result)   Collection Time: 05/17/24  8:10 PM   Specimen: BLOOD  Result Value Ref Range Status   Specimen Description BLOOD RIGHT ANTECUBITAL  Final   Special Requests   Final    BOTTLES DRAWN AEROBIC AND ANAEROBIC Blood Culture results may not be optimal due to an inadequate volume of blood received in culture bottles   Culture   Final    NO GROWTH 2 DAYS Performed at [facility], [address] [place], [place] [address]    Report Status PENDING  Incomplete  Culture, blood (routine x 2)     Status: None (Preliminary result)   Collection Time: 05/17/24  8:10 PM   Specimen: BLOOD  Result Value Ref Range Status   Specimen Description BLOOD BLOOD LEFT HAND  Final   Special Requests   Final    BOTTLES DRAWN AEROBIC ONLY Blood Culture results may not be optimal due to an inadequate volume of blood received in culture bottles   Culture   Final    NO GROWTH 2 DAYS Performed at [facility], [address], [place], [place] [address]    Report Status PENDING  Incomplete  C Difficile Quick Screen w PCR reflex     Status: None   Collection Time: 05/17/24  8:30 PM   Specimen: Stool  Result Value Ref Range Status   C Diff antigen NEGATIVE NEGATIVE Final   C Diff toxin NEGATIVE NEGATIVE Final   C Diff interpretation No C. difficile detected.  Final    Comment: Performed at [facility], [address] [place], [place] [address]     Radiology Studies: CT ABDOMEN PELVIS W CONTRAST Result Date: 05/17/2024 CLINICAL DATA:  Generalized weakness, nausea/vomiting/diarrhea, abdominal pain EXAM: CT ABDOMEN AND PELVIS WITH CONTRAST TECHNIQUE: Multidetector CT imaging of the abdomen and pelvis was performed using the standard  protocol following bolus administration of intravenous contrast. RADIATION DOSE REDUCTION: This exam was performed according to the departmental dose-optimization program which includes automated exposure control, adjustment of the mA and/or kV according to patient size and/or use of iterative reconstruction technique. CONTRAST:  OMNIPAQUE  IOHEXOL  300 MG/ML  SOLN COMPARISON:  11/17/2018 FINDINGS: Lower chest: No acute pleural or parenchymal lung disease. Hepatobiliary: No focal liver abnormality is seen. No gallstones, gallbladder wall thickening, or biliary dilatation. Pancreas: Unremarkable. No pancreatic ductal dilatation or surrounding inflammatory changes. Spleen: Normal in size without focal abnormality. Adrenals/Urinary Tract: Adrenal glands are unremarkable. Kidneys are normal, without renal calculi, focal lesion, or hydronephrosis. Bladder is unremarkable. Stomach/Bowel: No bowel obstruction or ileus. The appendix is not well visualized. Diffuse colonic diverticulosis, with segmental wall thickening from the distal descending colon through the sigmoid colon compatible with either acute  diverticulitis or colitis. No perforation, fluid collection, or abscess. Vascular/Lymphatic: Aortic atherosclerosis. No enlarged abdominal or pelvic lymph nodes. Reproductive: Status post hysterectomy. No adnexal masses. No free fluid or free intraperitoneal Other: Gas.  No abdominal wall hernia. Musculoskeletal: No acute or destructive bony abnormalities. Chronic L4 compression deformity unchanged. Reconstructed images demonstrate no additional findings. IMPRESSION: 1. Segmental wall thickening from the distal descending colon through the sigmoid colon, consistent with acute diverticulitis versus colitis. No perforation, fluid collection, or abscess. 2.  Aortic Atherosclerosis (ICD10-I70.0). Electronically Signed   By: Bobbye Burrow M.D.   On: 05/17/2024 19:27    Scheduled Meds:  apixaban   5 mg Oral BID    atorvastatin   40 mg Oral Daily   cyanocobalamin  1,000 mcg Oral Daily   diltiazem   360 mg Oral Daily   gabapentin   100 mg Oral BID   levothyroxine   88 mcg Oral Daily   liver oil-zinc oxide   Topical TID   Continuous Infusions:  cefTRIAXone  (ROCEPHIN )  IV 2 g (05/18/24 2114)   metronidazole 500 mg (05/19/24 0803)    LOS: 1 day   Time spent: 55 mins  Don Tiu Lincoln Renshaw, MD How to contact the [facility] Attending or Consulting provider 7A - 7P or covering provider during after hours 7P -7A, for this patient?  Check the care team in [facility] and look for a) attending/consulting [facility] provider listed and b) the [facility] team listed Log into [email or web address] to find provider on call.  Locate the [facility] provider you are looking for under [facility] and page to a number that you can be directly reached. If you still have difficulty reaching the provider, please page the [facility] (Director on Call) for the Hospitalists listed on amion for assistance.  05/19/2024, 11:53 AM

## 2024-05-19 NOTE — Plan of Care (Signed)

## 2024-05-19 NOTE — Plan of Care (Signed)
  Problem: Health Behavior/Discharge Planning: Goal: Ability to manage health-related needs will improve Outcome: Progressing   Problem: Clinical Measurements: Goal: Ability to maintain clinical measurements within normal limits will improve Outcome: Progressing Goal: Will remain free from infection Outcome: Progressing Goal: Diagnostic test results will improve Outcome: Progressing   Problem: Activity: Goal: Risk for activity intolerance will decrease Outcome: Progressing   Problem: Elimination: Goal: Will not experience complications related to bowel motility Outcome: Progressing Goal: Will not experience complications related to urinary retention Outcome: Progressing   Problem: Pain Managment: Goal: General experience of comfort will improve and/or be controlled Outcome: Progressing   Problem: Safety: Goal: Ability to remain free from injury will improve Outcome: Progressing

## 2024-05-19 NOTE — Progress Notes (Signed)
 Mobility Specialist Progress Note:    05/19/24 1220  Mobility  Activity Ambulated with assistance in hallway  Level of Assistance Standby assist, set-up cues, supervision of patient - no hands on  Assistive Device Front wheel walker  Distance Ambulated (ft) 80 ft  Range of Motion/Exercises Active;All extremities  Activity Response Tolerated well  Mobility Referral Yes  Mobility visit 1 Mobility  Mobility Specialist Start Time (ACUTE ONLY) 1220  Mobility Specialist Stop Time (ACUTE ONLY) 1240  Mobility Specialist Time Calculation (min) (ACUTE ONLY) 20 min   Pt received sitting EOB, agreeable to mobility. Required supervision to stand and ambulate with RW. Tolerated well,asx throughout. Left pt in chair, call bell in reach. All needs met.  Kennieth Plotts Mobility Specialist Please contact via Special educational needs teacher or  Rehab office at [phone]

## 2024-05-20 DIAGNOSIS — K5792 Diverticulitis of intestine, part unspecified, without perforation or abscess without bleeding: Secondary | ICD-10-CM | POA: Diagnosis not present

## 2024-05-20 DIAGNOSIS — E782 Mixed hyperlipidemia: Secondary | ICD-10-CM

## 2024-05-20 DIAGNOSIS — E039 Hypothyroidism, unspecified: Secondary | ICD-10-CM | POA: Diagnosis not present

## 2024-05-20 DIAGNOSIS — E876 Hypokalemia: Secondary | ICD-10-CM

## 2024-05-20 MED ORDER — AMOXICILLIN-POT CLAVULANATE 500-125 MG PO TABS: 1.0000 | ORAL_TABLET | Freq: Two times a day (BID) | ORAL | 0 refills | Status: AC

## 2024-05-20 MED ORDER — APIXABAN 5 MG PO TABS: 5.0000 mg | ORAL_TABLET | Freq: Two times a day (BID) | ORAL | Status: AC

## 2024-05-20 NOTE — Discharge Instructions (Addendum)
 Soft food diet for next 3 days, advance as tolerated to regular diet after that    IMPORTANT INFORMATION: PAY CLOSE ATTENTION   PHYSICIAN DISCHARGE INSTRUCTIONS  Follow with Primary care provider  Kelsey Amel, MD  and other consultants as instructed by your Hospitalist Physician  SEEK MEDICAL CARE OR RETURN TO EMERGENCY ROOM IF SYMPTOMS COME BACK, WORSEN OR NEW PROBLEM DEVELOPS   Please note: You were cared for by a hospitalist during your hospital stay. Every effort will be made to forward records to your primary care provider.  You can request that your primary care provider send for your hospital records if they have not received them.  Once you are discharged, your primary care physician will handle any further medical issues. Please note that NO REFILLS for any discharge medications will be authorized once you are discharged, as it is imperative that you return to your primary care physician (or establish a relationship with a primary care physician if you do not have one) for your post hospital discharge needs so that they can reassess your need for medications and monitor your lab values.  Please get a complete blood count and chemistry panel checked by your Primary MD at your next visit, and again as instructed by your Primary MD.  Get Medicines reviewed and adjusted: Please take all your medications with you for your next visit with your Primary MD  Laboratory/radiological data: Please request your Primary MD to go over all hospital tests and procedure/radiological results at the follow up, please ask your primary care provider to get all Hospital records sent to his/her office.  In some cases, they will be blood work, cultures and biopsy results pending at the time of your discharge. Please request that your primary care provider follow up on these results.  If you are diabetic, please bring your blood sugar readings with you to your follow up appointment with primary care.     Please call and make your follow up appointments as soon as possible.    Also Note the following: If you experience worsening of your admission symptoms, develop shortness of breath, life threatening emergency, suicidal or homicidal thoughts you must seek medical attention immediately by calling 911 or calling your MD immediately  if symptoms less severe.  You must read complete instructions/literature along with all the possible adverse reactions/side effects for all the Medicines you take and that have been prescribed to you. Take any new Medicines after you have completely understood and accpet all the possible adverse reactions/side effects.   Do not drive when taking Pain medications or sleeping medications (Benzodiazepines)  Do not take more than prescribed Pain, Sleep and Anxiety Medications. It is not advisable to combine anxiety,sleep and pain medications without talking with your primary care practitioner  Special Instructions: If you have smoked or chewed Tobacco  in the last 2 yrs please stop smoking, stop any regular Alcohol  and or any Recreational drug use.  Wear Seat belts while driving.  Do not drive if taking any narcotic, mind altering or controlled substances or recreational drugs or alcohol.

## 2024-05-20 NOTE — Discharge Summary (Signed)
 Physician Discharge Summary  Kelsey Ruiz NWG:[id number] DOB: [birth year] DOA: 05/17/2024  PCP: Minus Amel, MD  Admit date: 05/17/2024 Discharge date: 05/20/2024  Admitted From:  HOME  Disposition:  HOME   Recommendations for Outpatient Follow-up:  Follow up with PCP in 1 weeks Please obtain BMP/CBC in 1-2 weeks  Home Health:  PT   Discharge Condition: STABLE   CODE STATUS: FULL  DIET: soft foods, advance as tolerated    Brief Hospitalization Summary: Please see all hospital notes, images, labs for full details of the hospitalization. Admission provider HPI:   89 y.o. female, with medical history significant of atrial fibrillation, hypertension, hyperlipidemia, hyperlipidemia, pulmonary embolism, hypertension - Patient presents to ED secondary to complaints of abdominal pain, nausea, vomiting and diarrhea, symptoms ongoing for last 3 days, started on Sunday, she denies any sick contacts, but daughter at bedside reports she has herself some GI symptoms, denies any coffee-ground emesis, any hematochezia, fever, but some chills, given lack of improvement she came to ED. - In ED sodium low at 132, potassium at 2.9, and white blood cell count elevated at 11.7, CT abdomen pelvis significant for acute diverticulitis versus colitis, [facility] hospitalist consulted to admit.  Hospital Course by listed problems addressed   Abdominal pain/nausea/vomiting/diarrhea Acute diverticulitis versus acute colitis Dehydration -Symptoms ongoing for last 3 days prior to arrival, CT abdomen pelvis significant for acute diverticulitis versus colitis -Clinically dehydrated, unable to tolerate oral intake, therefore was admitted -treated with IV fluids and IV antibiotics and clinically she is much improved now - As needed pain medicine. - She received 1 dose of IV Zosyn  in ED, treated with Rocephin  and Flagyl  - C diff was negative - pt is feeling better, tolerating soft foods diet, appetite much better - DC  home to complete remainder 5 days of oral augmentin to complete course    Hyponatremia -resolved with IV fluids   Hypokalemia - repleted    Weakness, deconditioning - PT eval requested -- HOME HEALTH PT ORDERED    Parox atrial fibrillation -Continue with Cardizem , currently normal sinus rhythm-continue with Eliquis  for anticoagulation   History of PE - She remains on Eliquis     Essential hypertension - Continue with Cardizem  and as needed hydralazine    Mixed hyperlipidemia Continue Lipitor   GERD Continue Protonix   Discharge Diagnoses:  Principal Problem:   Acute diverticulitis Active Problems:   Hypothyroidism   Mixed hyperlipidemia   GERD without esophagitis   Diverticulitis   Discharge Instructions:  Allergies as of 05/20/2024       Reactions   Sulfa  Antibiotics Nausea And Vomiting        Medication List     STOP taking these medications    ciprofloxacin 500 MG tablet Commonly known as: CIPRO       TAKE these medications    acetaminophen  500 MG tablet Commonly known as: TYLENOL  Take 500 mg by mouth every 6 (six) hours as needed for headache.   Acidophilus 100 MG Caps Take 1 capsule by mouth daily.   ALPRAZolam  0.5 MG tablet Commonly known as: XANAX  Take 0.5 mg by mouth 2 (two) times daily as needed for anxiety or sleep.   amoxicillin-clavulanate 500-125 MG tablet Commonly known as: Augmentin Take 1 tablet by mouth 2 (two) times daily for 5 days.   apixaban  5 MG Tabs tablet Commonly known as: ELIQUIS  Take 1 tablet (5 mg total) by mouth 2 (two) times daily. What changed: See the new instructions.   ascorbic acid  500 MG tablet  Commonly known as: VITAMIN C  Take 1,000 mg by mouth every morning.   atorvastatin  80 MG tablet Commonly known as: LIPITOR Take 40 mg by mouth daily.   CAL-MAG-ZINC  PO Take 1 tablet by mouth every morning.   cholecalciferol 1000 units tablet Commonly known as: VITAMIN D Take 1,000 Units by mouth every  morning.   Coenzyme Q10 200 MG capsule Take 200 mg by mouth every morning.   diltiazem  180 MG 24 hr capsule Commonly known as: CARDIZEM  CD Take 360 mg by mouth daily.   furosemide  40 MG tablet Commonly known as: LASIX  Take 40 mg by mouth daily as needed for fluid or edema.   gabapentin  100 MG capsule Commonly known as: NEURONTIN  Take 100 mg by mouth 2 (two) times daily.   HYDROcodone -acetaminophen  7.5-325 MG tablet Commonly known as: NORCO Take 1 tablet by mouth every 6 (six) hours.   levothyroxine  88 MCG tablet Commonly known as: SYNTHROID  Take 88 mcg by mouth daily.   omeprazole 40 MG capsule Commonly known as: PRILOSEC Take 40 mg by mouth daily as needed (heartburn).   ondansetron  4 MG disintegrating tablet Commonly known as: Zofran  ODT Take 1 tablet (4 mg total) by mouth every 8 (eight) hours as needed for nausea or vomiting.   potassium chloride  SA 20 MEQ tablet Commonly known as: KLOR-CON  M Take 20 mEq by mouth 2 (two) times daily as needed (with lasix ).   Vitamin B-12 CR 1500 MCG Tbcr Take 1 tablet by mouth daily.        Follow-up Information     Minus Amel, MD. Schedule an appointment as soon as possible for a visit in 1 week(s).   Specialty: Family Medicine Why: Hospital Follow Up Contact information: [address] [place] [address] [phone]                Allergies  Allergen Reactions   Sulfa  Antibiotics Nausea And Vomiting   Allergies as of 05/20/2024       Reactions   Sulfa  Antibiotics Nausea And Vomiting        Medication List     STOP taking these medications    ciprofloxacin 500 MG tablet Commonly known as: CIPRO       TAKE these medications    acetaminophen  500 MG tablet Commonly known as: TYLENOL  Take 500 mg by mouth every 6 (six) hours as needed for headache.   Acidophilus 100 MG Caps Take 1 capsule by mouth daily.   ALPRAZolam  0.5 MG tablet Commonly known as: XANAX  Take 0.5 mg by mouth 2  (two) times daily as needed for anxiety or sleep.   amoxicillin-clavulanate 500-125 MG tablet Commonly known as: Augmentin Take 1 tablet by mouth 2 (two) times daily for 5 days.   apixaban  5 MG Tabs tablet Commonly known as: ELIQUIS  Take 1 tablet (5 mg total) by mouth 2 (two) times daily. What changed: See the new instructions.   ascorbic acid  500 MG tablet Commonly known as: VITAMIN C  Take 1,000 mg by mouth every morning.   atorvastatin  80 MG tablet Commonly known as: LIPITOR Take 40 mg by mouth daily.   CAL-MAG-ZINC  PO Take 1 tablet by mouth every morning.   cholecalciferol 1000 units tablet Commonly known as: VITAMIN D Take 1,000 Units by mouth every morning.   Coenzyme Q10 200 MG capsule Take 200 mg by mouth every morning.   diltiazem  180 MG 24 hr capsule Commonly known as: CARDIZEM  CD Take 360 mg by mouth daily.   furosemide  40  MG tablet Commonly known as: LASIX  Take 40 mg by mouth daily as needed for fluid or edema.   gabapentin  100 MG capsule Commonly known as: NEURONTIN  Take 100 mg by mouth 2 (two) times daily.   HYDROcodone -acetaminophen  7.5-325 MG tablet Commonly known as: NORCO Take 1 tablet by mouth every 6 (six) hours.   levothyroxine  88 MCG tablet Commonly known as: SYNTHROID  Take 88 mcg by mouth daily.   omeprazole 40 MG capsule Commonly known as: PRILOSEC Take 40 mg by mouth daily as needed (heartburn).   ondansetron  4 MG disintegrating tablet Commonly known as: Zofran  ODT Take 1 tablet (4 mg total) by mouth every 8 (eight) hours as needed for nausea or vomiting.   potassium chloride  SA 20 MEQ tablet Commonly known as: KLOR-CON  M Take 20 mEq by mouth 2 (two) times daily as needed (with lasix ).   Vitamin B-12 CR 1500 MCG Tbcr Take 1 tablet by mouth daily.        Procedures/Studies: CT ABDOMEN PELVIS W CONTRAST Result Date: 05/17/2024 CLINICAL DATA:  Generalized weakness, nausea/vomiting/diarrhea, abdominal pain EXAM: CT ABDOMEN AND  PELVIS WITH CONTRAST TECHNIQUE: Multidetector CT imaging of the abdomen and pelvis was performed using the standard protocol following bolus administration of intravenous contrast. RADIATION DOSE REDUCTION: This exam was performed according to the departmental dose-optimization program which includes automated exposure control, adjustment of the mA and/or kV according to patient size and/or use of iterative reconstruction technique. CONTRAST:  OMNIPAQUE  IOHEXOL  300 MG/ML  SOLN COMPARISON:  11/17/2018 FINDINGS: Lower chest: No acute pleural or parenchymal lung disease. Hepatobiliary: No focal liver abnormality is seen. No gallstones, gallbladder wall thickening, or biliary dilatation. Pancreas: Unremarkable. No pancreatic ductal dilatation or surrounding inflammatory changes. Spleen: Normal in size without focal abnormality. Adrenals/Urinary Tract: Adrenal glands are unremarkable. Kidneys are normal, without renal calculi, focal lesion, or hydronephrosis. Bladder is unremarkable. Stomach/Bowel: No bowel obstruction or ileus. The appendix is not well visualized. Diffuse colonic diverticulosis, with segmental wall thickening from the distal descending colon through the sigmoid colon compatible with either acute diverticulitis or colitis. No perforation, fluid collection, or abscess. Vascular/Lymphatic: Aortic atherosclerosis. No enlarged abdominal or pelvic lymph nodes. Reproductive: Status post hysterectomy. No adnexal masses. No free fluid or free intraperitoneal Other: Gas.  No abdominal wall hernia. Musculoskeletal: No acute or destructive bony abnormalities. Chronic L4 compression deformity unchanged. Reconstructed images demonstrate no additional findings. IMPRESSION: 1. Segmental wall thickening from the distal descending colon through the sigmoid colon, consistent with acute diverticulitis versus colitis. No perforation, fluid collection, or abscess. 2.  Aortic Atherosclerosis (ICD10-I70.0).  Electronically Signed   By: Bobbye Burrow M.D.   On: 05/17/2024 19:27     Subjective: Pt says her appetite is back and she is eating better than she has in last month.  She says her abdominal pain is resolved now.  She is agreeable to going [facility] with [facility].   Discharge Exam: Vitals:   05/19/24 2040 05/20/24 0525  BP: (!) 154/96 (!) 158/72  Pulse: 81 72  Resp: 18 17  Temp: 98.7 F (37.1 C) 97.8 F (36.6 C)  SpO2: 99% 94%   Vitals:   05/19/24 1713 05/19/24 1800 05/19/24 2040 05/20/24 0525  BP: (!) 142/74  (!) 154/96 (!) 158/72  Pulse: 76  81 72  Resp:   18 17  Temp: 99.3 F (37.4 C) 98.3 F (36.8 C) 98.7 F (37.1 C) 97.8 F (36.6 C)  TempSrc: Oral Oral Oral Oral  SpO2:   99% 94%  Weight:      Height:        General: Pt is alert, awake, not in acute distress Cardiovascular: RRR, S1/S2 +, no rubs, no gallops Respiratory: CTA bilaterally, no wheezing, no rhonchi Abdominal: Soft, NT, ND, bowel sounds + Extremities: no edema, no cyanosis   The results of significant diagnostics from this hospitalization (including imaging, microbiology, ancillary and laboratory) are listed below for reference.     Microbiology: Recent Results (from the past 240 hours)  Culture, blood (routine x 2)     Status: None (Preliminary result)   Collection Time: 05/17/24  8:10 PM   Specimen: BLOOD  Result Value Ref Range Status   Specimen Description BLOOD RIGHT ANTECUBITAL  Final   Special Requests   Final    BOTTLES DRAWN AEROBIC AND ANAEROBIC Blood Culture results may not be optimal due to an inadequate volume of blood received in culture bottles   Culture   Final    NO GROWTH 3 DAYS Performed at [facility], [address], [place], [place] [address]    Report Status PENDING  Incomplete  Culture, blood (routine x 2)     Status: None (Preliminary result)   Collection Time: 05/17/24  8:10 PM   Specimen: BLOOD  Result Value Ref Range Status   Specimen Description BLOOD BLOOD LEFT HAND   Final   Special Requests   Final    BOTTLES DRAWN AEROBIC ONLY Blood Culture results may not be optimal due to an inadequate volume of blood received in culture bottles   Culture   Final    NO GROWTH 3 DAYS Performed at [facility], [address] [place], [place] [address]    Report Status PENDING  Incomplete  C Difficile Quick Screen w PCR reflex     Status: None   Collection Time: 05/17/24  8:30 PM   Specimen: Stool  Result Value Ref Range Status   C Diff antigen NEGATIVE NEGATIVE Final   C Diff toxin NEGATIVE NEGATIVE Final   C Diff interpretation No C. difficile detected.  Final    Comment: Performed at [facility], [address] [place], [place] [address]     Labs: BNP (last 3 results) No results for input(s): "BNP" in the last 8760 hours. Basic Metabolic Panel: Recent Labs  Lab 05/17/24 1814 05/18/24 0441 05/19/24 0529  NA 132* 139 139  K 2.9* 3.4* 3.5  CL 98 106 107  CO2 23 23 24   GLUCOSE 118* 101* 97  BUN 12 9 6*  CREATININE 0.66 0.56 0.58  CALCIUM  9.1 7.9* 8.6*  MG 2.1  --   --   PHOS  --  2.8  --    Liver Function Tests: Recent Labs  Lab 05/17/24 1814  AST 16  ALT 11  ALKPHOS 39  BILITOT 1.1  PROT 7.0  ALBUMIN 3.9   Recent Labs  Lab 05/17/24 1814  LIPASE 24   No results for input(s): "AMMONIA" in the last 168 hours. CBC: Recent Labs  Lab 05/17/24 1814 05/18/24 0441  WBC 11.7* 9.5  NEUTROABS 9.5*  --   HGB 13.8 12.3  HCT 41.9 37.3  MCV 85.5 88.2  PLT 286 270   Cardiac Enzymes: No results for input(s): "CKTOTAL", "CKMB", "CKMBINDEX", "TROPONINI" in the last 168 hours. BNP: Invalid input(s): "POCBNP" CBG: No results for input(s): "GLUCAP" in the last 168 hours. D-Dimer No results for input(s): "DDIMER" in the last 72 hours. Hgb A1c No results for input(s): "HGBA1C" in the last  72 hours. Lipid Profile No results for input(s): "CHOL", "HDL", "LDLCALC", "TRIG", "CHOLHDL", "LDLDIRECT" in the last 72 hours. Thyroid  function  studies No results for input(s): "TSH", "T4TOTAL", "T3FREE", "THYROIDAB" in the last 72 hours.  Invalid input(s): "FREET3" Anemia work up No results for input(s): "VITAMINB12", "FOLATE", "FERRITIN", "TIBC", "IRON", "RETICCTPCT" in the last 72 hours. Urinalysis    Component Value Date/Time   COLORURINE STRAW (A) 05/17/2024 1849   APPEARANCEUR CLEAR 05/17/2024 1849   LABSPEC 1.004 (L) 05/17/2024 1849   PHURINE 7.0 05/17/2024 1849   GLUCOSEU NEGATIVE 05/17/2024 1849   HGBUR SMALL (A) 05/17/2024 1849   BILIRUBINUR NEGATIVE 05/17/2024 1849   KETONESUR 5 (A) 05/17/2024 1849   PROTEINUR NEGATIVE 05/17/2024 1849   UROBILINOGEN 0.2 07/07/2014 1129   NITRITE NEGATIVE 05/17/2024 1849   LEUKOCYTESUR NEGATIVE 05/17/2024 1849   Sepsis Labs Recent Labs  Lab 05/17/24 1814 05/18/24 0441  WBC 11.7* 9.5   Microbiology Recent Results (from the past 240 hours)  Culture, blood (routine x 2)     Status: None (Preliminary result)   Collection Time: 05/17/24  8:10 PM   Specimen: BLOOD  Result Value Ref Range Status   Specimen Description BLOOD RIGHT ANTECUBITAL  Final   Special Requests   Final    BOTTLES DRAWN AEROBIC AND ANAEROBIC Blood Culture results may not be optimal due to an inadequate volume of blood received in culture bottles   Culture   Final    NO GROWTH 3 DAYS Performed at [facility], [address], [place], [place] [address]    Report Status PENDING  Incomplete  Culture, blood (routine x 2)     Status: None (Preliminary result)   Collection Time: 05/17/24  8:10 PM   Specimen: BLOOD  Result Value Ref Range Status   Specimen Description BLOOD BLOOD LEFT HAND  Final   Special Requests   Final    BOTTLES DRAWN AEROBIC ONLY Blood Culture results may not be optimal due to an inadequate volume of blood received in culture bottles   Culture   Final    NO GROWTH 3 DAYS Performed at [facility], [address], [place], [place] [address]    Report Status PENDING  Incomplete  C  Difficile Quick Screen w PCR reflex     Status: None   Collection Time: 05/17/24  8:30 PM   Specimen: Stool  Result Value Ref Range Status   C Diff antigen NEGATIVE NEGATIVE Final   C Diff toxin NEGATIVE NEGATIVE Final   C Diff interpretation No C. difficile detected.  Final    Comment: Performed at [facility], [address], [place], [place] [address]    Time coordinating discharge: 38 mins   SIGNED:  Faustino Hook, MD  [facility] 05/20/2024, 9:41 AM How to contact the [facility] Attending or Consulting provider 7A - 7P or covering provider during after hours 7P -7A, for this patient?  Check the care team in [facility] and look for a) attending/consulting [facility] provider listed and b) the [facility] team listed Log into [email or web address] and use [facility]'s universal password to access. If you do not have the password, please contact the hospital operator. Locate the [facility] provider you are looking for under [facility] and page to a number that you can be directly reached. If you still have difficulty reaching the provider, please page the [facility] (Director on Call) for the Hospitalists listed on amion for assistance.

## 2024-05-20 NOTE — TOC Transition Note (Signed)
 Transition of Care [facility]) - Discharge Note   Patient Details  Name: Kelsey Ruiz MRN: [id number] Date of Birth: January 22, 1929  Transition of [facility]) CM/SW Contact:  Orelia Binet, RN Phone Number: 05/20/2024, 10:09 AM   Clinical Narrative:   Patient discharging home with family. PT recommending HHPT, patient declining at this admission.     Final next level of care: Home/Self Care Barriers to Discharge: No Barriers Identified   Patient Goals and CMS Choice Patient states their goals for this hospitalization and ongoing recovery are:: return home.   Discharge Placement            Patient and family notified of of transfer: 05/20/24  Discharge Plan and Services Additional resources added to the After Visit Summary for           Social Drivers of Health (SDOH) Interventions SDOH Screenings   Food Insecurity: No Food Insecurity (05/18/2024)  Housing: Low Risk  (05/18/2024)  Transportation Needs: No Transportation Needs (05/18/2024)  Utilities: Not At Risk (05/18/2024)  Social Connections: Moderately Integrated (05/18/2024)  Tobacco Use: Medium Risk (05/18/2024)    Readmission Risk Interventions    05/19/2024   11:14 AM  Readmission Risk Prevention Plan  Medication Screening Complete  Transportation Screening Complete

## 2024-05-20 NOTE — Progress Notes (Signed)
 Mobility Specialist Progress Note:    05/20/24 1000  Mobility  Activity Ambulated with assistance in hallway  Level of Assistance Standby assist, set-up cues, supervision of patient - no hands on  Assistive Device Front wheel walker  Distance Ambulated (ft) 100 ft  Range of Motion/Exercises Active;All extremities  Activity Response Tolerated well  Mobility Referral Yes  Mobility visit 1 Mobility  Mobility Specialist Start Time (ACUTE ONLY) 1000  Mobility Specialist Stop Time (ACUTE ONLY) 1020  Mobility Specialist Time Calculation (min) (ACUTE ONLY) 20 min   Pt received in bed, agreeable to mobility. Required supervision to stand and ambulate with RW. Tolerated well,asx throughout. Returned pt supine, all needs met.  Hardin Hardenbrook Mobility Specialist Please contact via Special educational needs teacher or  Rehab office at [phone]

## 2024-05-20 NOTE — Plan of Care (Signed)

## 2024-05-20 NOTE — Plan of Care (Signed)
  Problem: Nutrition: Goal: Adequate nutrition will be maintained Outcome: Progressing   Problem: Pain Managment: Goal: General experience of comfort will improve and/or be controlled Outcome: Progressing

## 2024-05-20 NOTE — Plan of Care (Signed)
  Problem: Education: Goal: Knowledge of General Education information will improve Description: Including pain rating scale, medication(s)/side effects and non-pharmacologic comfort measures 05/20/2024 1021 by Goldie Later, RN Outcome: Adequate for Discharge 05/20/2024 0735 by Goldie Later, RN Outcome: Progressing   Problem: Health Behavior/Discharge Planning: Goal: Ability to manage health-related needs will improve 05/20/2024 1021 by Goldie Later, RN Outcome: Adequate for Discharge 05/20/2024 0735 by Goldie Later, RN Outcome: Progressing   Problem: Clinical Measurements: Goal: Ability to maintain clinical measurements within normal limits will improve 05/20/2024 1021 by Goldie Later, RN Outcome: Adequate for Discharge 05/20/2024 0735 by Goldie Later, RN Outcome: Progressing Goal: Will remain free from infection 05/20/2024 1021 by Goldie Later, RN Outcome: Adequate for Discharge 05/20/2024 0735 by Goldie Later, RN Outcome: Progressing Goal: Diagnostic test results will improve 05/20/2024 1021 by Goldie Later, RN Outcome: Adequate for Discharge 05/20/2024 0735 by Goldie Later, RN Outcome: Progressing Goal: Respiratory complications will improve 05/20/2024 1021 by Goldie Later, RN Outcome: Adequate for Discharge 05/20/2024 0735 by Goldie Later, RN Outcome: Progressing Goal: Cardiovascular complication will be avoided 05/20/2024 1021 by Goldie Later, RN Outcome: Adequate for Discharge 05/20/2024 0735 by Goldie Later, RN Outcome: Progressing   Problem: Activity: Goal: Risk for activity intolerance will decrease 05/20/2024 1021 by Goldie Later, RN Outcome: Adequate for Discharge 05/20/2024 0735 by Goldie Later, RN Outcome: Progressing   Problem: Nutrition: Goal: Adequate nutrition will be maintained 05/20/2024 1021 by Goldie Later, RN Outcome: Adequate for Discharge 05/20/2024 0735 by Goldie Later, RN Outcome: Progressing   Problem: Coping: Goal: Level of anxiety will  decrease 05/20/2024 1021 by Goldie Later, RN Outcome: Adequate for Discharge 05/20/2024 0735 by Goldie Later, RN Outcome: Progressing   Problem: Elimination: Goal: Will not experience complications related to bowel motility 05/20/2024 1021 by Goldie Later, RN Outcome: Adequate for Discharge 05/20/2024 0735 by Goldie Later, RN Outcome: Progressing Goal: Will not experience complications related to urinary retention 05/20/2024 1021 by Goldie Later, RN Outcome: Adequate for Discharge 05/20/2024 0735 by Goldie Later, RN Outcome: Progressing   Problem: Pain Managment: Goal: General experience of comfort will improve and/or be controlled 05/20/2024 1021 by Goldie Later, RN Outcome: Adequate for Discharge 05/20/2024 0735 by Goldie Later, RN Outcome: Progressing   Problem: Safety: Goal: Ability to remain free from injury will improve 05/20/2024 1021 by Goldie Later, RN Outcome: Adequate for Discharge 05/20/2024 0735 by Goldie Later, RN Outcome: Progressing   Problem: Skin Integrity: Goal: Risk for impaired skin integrity will decrease 05/20/2024 1021 by Goldie Later, RN Outcome: Adequate for Discharge 05/20/2024 0735 by Goldie Later, RN Outcome: Progressing

## 2024-05-22 LAB — CULTURE, BLOOD (ROUTINE X 2)
Culture: NO GROWTH
Culture: NO GROWTH

## 2024-05-26 DIAGNOSIS — M810 Age-related osteoporosis without current pathological fracture: Secondary | ICD-10-CM | POA: Diagnosis not present

## 2024-05-26 DIAGNOSIS — I10 Essential (primary) hypertension: Secondary | ICD-10-CM | POA: Diagnosis not present

## 2024-05-26 DIAGNOSIS — M1991 Primary osteoarthritis, unspecified site: Secondary | ICD-10-CM | POA: Diagnosis not present

## 2024-05-26 DIAGNOSIS — K5792 Diverticulitis of intestine, part unspecified, without perforation or abscess without bleeding: Secondary | ICD-10-CM | POA: Diagnosis not present

## 2024-05-26 DIAGNOSIS — G894 Chronic pain syndrome: Secondary | ICD-10-CM | POA: Diagnosis not present

## 2024-06-15 DIAGNOSIS — G894 Chronic pain syndrome: Secondary | ICD-10-CM | POA: Diagnosis not present

## 2024-07-24 DIAGNOSIS — G894 Chronic pain syndrome: Secondary | ICD-10-CM | POA: Diagnosis not present

## 2024-08-23 DIAGNOSIS — G894 Chronic pain syndrome: Secondary | ICD-10-CM | POA: Diagnosis not present

## 2024-11-01 ENCOUNTER — Encounter: Payer: Self-pay | Admitting: Physician Assistant

## 2024-11-01 ENCOUNTER — Ambulatory Visit (INDEPENDENT_AMBULATORY_CARE_PROVIDER_SITE_OTHER): Admitting: Physician Assistant

## 2024-11-01 VITALS — BP 124/78 | HR 65 | Temp 97.7°F | Ht 62.0 in | Wt 140.1 lb

## 2024-11-01 DIAGNOSIS — E890 Postprocedural hypothyroidism: Secondary | ICD-10-CM | POA: Diagnosis not present

## 2024-11-01 DIAGNOSIS — F419 Anxiety disorder, unspecified: Secondary | ICD-10-CM | POA: Diagnosis not present

## 2024-11-01 DIAGNOSIS — I482 Chronic atrial fibrillation, unspecified: Secondary | ICD-10-CM

## 2024-11-01 DIAGNOSIS — Z7689 Persons encountering health services in other specified circumstances: Secondary | ICD-10-CM

## 2024-11-01 DIAGNOSIS — I10 Essential (primary) hypertension: Secondary | ICD-10-CM

## 2024-11-01 DIAGNOSIS — E782 Mixed hyperlipidemia: Secondary | ICD-10-CM

## 2024-11-01 DIAGNOSIS — G894 Chronic pain syndrome: Secondary | ICD-10-CM

## 2024-11-01 NOTE — Assessment & Plan Note (Signed)
 Hypothyroidism managed with levothyroxine . Recent dose adjustment by previous provider. - Ordered thyroid  function tests to assess current levothyroxine  dose.

## 2024-11-01 NOTE — Assessment & Plan Note (Signed)
 Managed with diltiazem  for rate control and anticoagulation to prevent thromboembolic events. - Continue diltiazem  for rate control. - Continue anticoagulation therapy.

## 2024-11-01 NOTE — Assessment & Plan Note (Signed)
 124/78 Controlled. No change in management. Discussed DASH diet and dietary sodium restrictions.  Continue dietary efforts and physical activity.

## 2024-11-01 NOTE — Assessment & Plan Note (Signed)
 Managed with hydroxyzine, transitioned from Xanax  due to safety concerns with concurrent use of benzodiazepines and opioids. - Continue hydroxyzine for anxiety management.

## 2024-11-01 NOTE — Progress Notes (Signed)
 New Patient Office Visit  Subjective    Patient ID: Kelsey Ruiz  Kelsey Ruiz, female    DOB: [birth year]  Age: 89 y.o. MRN: [id number]  CC:  Chief Complaint  Patient presents with   New Patient (Initial Visit)    Patient is a new patient. She wants to talk about medications and referrals.  She is on arthritis, blood pressure, and thyroid  medication    HPI Kelsey Ruiz presents to establish care  Discussed the use of AI scribe software for clinical note transcription with the patient, who gave verbal consent to proceed.  History of Present Illness Kelsey Ruiz is a 89 year old female with chronic pain and anxiety who presents for management of her pain medication and anxiety treatment.  She experiences chronic pain from arthritis affecting all joints, requiring hydrocodone  7.5 mg/325 mg twice daily, though she uses it as needed. She is concerned about discontinuing the medication abruptly as she has been on it for many years. Gabapentin  is also used for pain management, typically once daily.  Anxiety is particularly problematic at night. She previously used Xanax  but now takes hydroxyzine at bedtime, which is effective. Xanax  is used occasionally when anxiety escalates.  She requires daily thyroid  medication due to previous thyroidectomy, with a recent dose adjustment to 88 micrograms. Updated thyroid  levels are due.  Atrial fibrillation is managed with a blood thinner and Cardizem  for rate control. Lasix  is used as needed for ankle swelling.  She is prone to urinary tract infections, with an antibiotic prescribed for symptomatic episodes. Her daughter monitors for changes in urine odor and color, initiating antibiotics if symptoms persist despite increased hydration.  She uses a commode in her room to prevent falls, having experienced falls in the past.   Outpatient Encounter Medications as of 11/01/2024  Medication Sig   acetaminophen  (TYLENOL ) 500 MG tablet Take 500 mg by  mouth every 6 (six) hours as needed for headache.   ALPRAZolam  (XANAX ) 0.5 MG tablet Take 0.5 mg by mouth 2 (two) times daily as needed for anxiety or sleep.   apixaban  (ELIQUIS ) 5 MG TABS tablet Take 1 tablet (5 mg total) by mouth 2 (two) times daily.   atorvastatin  (LIPITOR) 80 MG tablet Take 40 mg by mouth daily.   Calcium -Magnesium -Zinc  (CAL-MAG-ZINC  PO) Take 1 tablet by mouth every morning.    cholecalciferol (VITAMIN D) 1000 UNITS tablet Take 1,000 Units by mouth every morning.    Coenzyme Q10 200 MG capsule Take 200 mg by mouth every morning.    Cyanocobalamin  (VITAMIN B-12 CR) 1500 MCG TBCR Take 1 tablet by mouth daily.   diltiazem  (CARDIZEM  CD) 180 MG 24 hr capsule Take 360 mg by mouth daily.   furosemide  (LASIX ) 40 MG tablet Take 40 mg by mouth daily as needed for fluid or edema.   gabapentin  (NEURONTIN ) 100 MG capsule Take 100 mg by mouth 2 (two) times daily.   HYDROcodone -acetaminophen  (NORCO) 7.5-325 MG tablet Take 1 tablet by mouth every 6 (six) hours.   Lactobacillus (ACIDOPHILUS) 100 MG CAPS Take 1 capsule by mouth daily.   levothyroxine  (SYNTHROID ) 88 MCG tablet Take 88 mcg by mouth daily.   omeprazole (PRILOSEC) 40 MG capsule Take 40 mg by mouth daily as needed (heartburn).   ondansetron  (ZOFRAN  ODT) 4 MG disintegrating tablet Take 1 tablet (4 mg total) by mouth every 8 (eight) hours as needed for nausea or vomiting.   potassium chloride  SA (KLOR-CON  M) 20 MEQ tablet Take 20 mEq by mouth 2 (  two) times daily as needed (with lasix ).   vitamin C  (ASCORBIC ACID ) 500 MG tablet Take 1,000 mg by mouth every morning.    No facility-administered encounter medications on file as of 11/01/2024.    Past Medical History:  Diagnosis Date   Arthritis    GERD (gastroesophageal reflux disease)    Glaucoma    Hx of bladder infections    Hyperlipemia    Hypertension    Hypothyroidism     Past Surgical History:  Procedure Laterality Date   ABDOMINAL HYSTERECTOMY     BREAST BIOPSY  Right 04/11/2014   Procedure: RIGHT BREAST BIOPSY AFTER NEEDLE LOCALIZATION;  Surgeon: Oneil DELENA Budge, MD;  Location: [facility];  Service: General;  Laterality: Right;  NEEDLE LOC @ 8:00   BREAST LUMPECTOMY Right    BREAST LUMPECTOMY Right    BREAST SURGERY Bilateral    benign cysts   CESAREAN SECTION     laser eyes Bilateral    for glaucoma   THYROIDECTOMY      Family History  Problem Relation Age of Onset   Breast cancer Neg Hx     Social History   Socioeconomic History   Marital status: Widowed    Spouse name: Not on file   Number of children: Not on file   Years of education: Not on file   Highest education level: Not on file  Occupational History   Not on file  Tobacco Use   Smoking status: Former    Current packs/day: 0.00    Average packs/day: 0.5 packs/day for 10.0 years (5.0 ttl pk-yrs)    Types: Cigarettes    Start date: 04/05/1974    Quit date: 04/05/1984    Years since quitting: 40.6   Smokeless tobacco: Never  Vaping Use   Vaping status: Never Used  Substance and Sexual Activity   Alcohol use: No   Drug use: No   Sexual activity: Yes    Birth control/protection: Surgical  Other Topics Concern   Not on file  Social History Narrative   Not on file   Social Drivers of Health   Financial Resource Strain: Not on file  Food Insecurity: No Food Insecurity (05/18/2024)   Hunger Vital Sign    Worried About Running Out of Food in the Last Year: Never true    Ran Out of Food in the Last Year: Never true  Transportation Needs: No Transportation Needs (05/18/2024)   PRAPARE - Administrator, Civil Service (Medical): No    Lack of Transportation (Non-Medical): No  Physical Activity: Not on file  Stress: Not on file  Social Connections: Moderately Integrated (05/18/2024)   Social Connection and Isolation Panel    Frequency of Communication with Friends and Family: Three times a week    Frequency of Social Gatherings with Friends and Family: Three times a  week    Attends Religious Services: More than 4 times per year    Active Member of Clubs or Organizations: No    Attends Banker Meetings: 1 to 4 times per year    Marital Status: Widowed  Intimate Partner Violence: Not At Risk (05/18/2024)   Humiliation, Afraid, Rape, and Kick questionnaire    Fear of Current or Ex-Partner: No    Emotionally Abused: No    Physically Abused: No    Sexually Abused: No    Review of Systems  Constitutional:  Negative for chills, fever and malaise/fatigue.  Eyes:  Negative for blurred vision and double  vision.  Respiratory:  Negative for cough and shortness of breath.   Cardiovascular:  Negative for chest pain and palpitations.  Musculoskeletal:  Positive for back pain and joint pain. Negative for myalgias.  Neurological:  Negative for dizziness and headaches.  Psychiatric/Behavioral:  Negative for depression. The patient is nervous/anxious.         Objective    BP 124/78   Pulse 65   Temp 97.7 F (36.5 C)   Ht 5' 2 (1.575 m)   Wt 140 lb 1 oz (63.5 kg)   SpO2 97%   BMI 25.62 kg/m   Physical Exam Constitutional:      General: She is not in acute distress.    Appearance: Normal appearance. She is normal weight. She is not ill-appearing.  HENT:     Head: Normocephalic and atraumatic.     Mouth/Throat:     Mouth: Mucous membranes are moist.     Pharynx: Oropharynx is clear.  Eyes:     Extraocular Movements: Extraocular movements intact.     Conjunctiva/sclera: Conjunctivae normal.  Cardiovascular:     Rate and Rhythm: Normal rate and regular rhythm.     Heart sounds: Normal heart sounds. No murmur heard. Pulmonary:     Effort: Pulmonary effort is normal.     Breath sounds: Normal breath sounds.  Skin:    General: Skin is warm and dry.  Neurological:     General: No focal deficit present.     Mental Status: She is alert and oriented to person, place, and time.  Psychiatric:        Mood and Affect: Mood normal.         Behavior: Behavior normal.       Assessment & Plan:  Encounter to establish care  Chronic pain syndrome Assessment & Plan: Chronic pain managed with Norco and gabapentin . Transitioned from Xanax  to hydroxyzine for anxiety management due to safety concerns with concurrent use of benzodiazepines and opioids. - Continue Norco as needed for pain management. - Encouraged daily use of gabapentin  for better pain control. - Referred to pain management specialist for long-term pain management. - Discontinued Xanax  and switched to hydroxyzine for anxiety management.   Orders: -     Ambulatory referral to Pain Clinic  Postoperative hypothyroidism Assessment & Plan: Hypothyroidism managed with levothyroxine . Recent dose adjustment by previous provider. - Ordered thyroid  function tests to assess current levothyroxine  dose.  Orders: -     TSH + free T4  Essential hypertension Assessment & Plan: 124/78 Controlled. No change in management. Discussed DASH diet and dietary sodium restrictions.  Continue dietary efforts and physical activity.   Orders: -     CMP14+EGFR -     CBC with Differential/Platelet  Anxiety Assessment & Plan: Managed with hydroxyzine, transitioned from Xanax  due to safety concerns with concurrent use of benzodiazepines and opioids. - Continue hydroxyzine for anxiety management.   Mixed hyperlipidemia Assessment & Plan: Stable. Continue with current management without changes. Discussed healthy diet and lifestyle. Lipid panel today.   Orders: -     Lipid panel  Atrial fibrillation, chronic (HCC) Assessment & Plan: Managed with diltiazem  for rate control and anticoagulation to prevent thromboembolic events. - Continue diltiazem  for rate control. - Continue anticoagulation therapy.     Return in about 6 months (around 05/01/2025) for chronic f/u or sooner as needed.   Charmaine Kaien Pezzullo, PA-C

## 2024-11-01 NOTE — Assessment & Plan Note (Signed)
 Chronic pain managed with Norco and gabapentin . Transitioned from Xanax  to hydroxyzine  for anxiety management due to safety concerns with concurrent use of benzodiazepines and opioids. - Continue Norco as needed for pain management. - Encouraged daily use of gabapentin  for better pain control. - Referred to pain management specialist for long-term pain management. - Discontinued Xanax  and switched to hydroxyzine  for anxiety management.

## 2024-11-01 NOTE — Patient Instructions (Signed)
 [place]  Centers for Disease Control and Prevention (CDC) and the American Academy of Pain Medicine recommend extreme caution when prescribing narcotic pain medications (such as tramadol, Percocet, hydrocodone , oxycodone ) and benzodiazepines (like Xanax  or lorazepam, or Klonopin) together due to the increased risk of serious harm, including overdose and death. Both drug classes depress the central nervous system and can increase each other's effects, especially by further decreasing respiratory drive, which can lead to life-threatening respiratory depression.  Studies have shown that simultaneous use of benzodiazepines and opioids is associated with a significantly higher risk of overdose death compared to opioids alone, with a study showing a near-quadrupling of risk. The risk is even greater with unpredictable use, higher dosages, or when combined with other substances such as alcohol.  The CDC notes that while there may be very rare situations where concurrent use is justified (such as severe acute pain in a patient on long-term, stable, low-dose benzodiazepine therapy), clinicians must carefully weigh the risks and benefits and discuss these with the patient. Abrupt discontinuation of benzodiazepines can be destabilizing and may cause withdrawal symptoms, so any changes should be made gradually and with close monitoring. In summary, the combination of narcotic pain medications and benzodiazepines is associated with a markedly increased risk of life-threatening adverse events, and should be avoided whenever possible.  As your primary care provider, my goal is to give you the best care I can while also keeping you safe. If you are on one or both of these medications you can expect we will have a conversation about the safety of use. As this office does not handle long term pain medications, you will be offered a referral to pain management for continued refills of narcotics. If you are also on a  benzodiazepine, we will talk about how to safely transition to a less risky medication. It is a pleasure to work with you, thank you for letting me be apart of you medical care.

## 2024-11-01 NOTE — Assessment & Plan Note (Signed)
 Stable. Continue with current management without changes. Discussed healthy diet and lifestyle. Lipid panel today.

## 2024-11-02 ENCOUNTER — Ambulatory Visit: Payer: Self-pay

## 2024-11-02 NOTE — Telephone Encounter (Signed)
 FYI Only or Action Required?: Action required by provider: medication refill request and clinical question for provider.  Patient was last seen in primary care on 11/01/2024 by Grooms, [place], [place].  Called Nurse Triage reporting Medication Problem.  Symptoms began today.  Interventions attempted: Nothing.  Symptoms are: stable.  Triage Disposition: No disposition on file.  Patient/caregiver understands and will follow disposition?:  Answer Assessment - Initial Assessment Questions 1. REASON FOR CALL: What is the main reason for your call? or How can I best help you?     Patient reports did not receive prescription for Norco 7.5mg , LOV 11/01/24.  Patient reports her and her daughter had doctor visits at the same time with Charmaine Grooms PA and the prescription was sent to her daughter, [name] Arenas.  Patient reports no new or worsening symptoms.  Advised UC/ED if symptoms worsen.  Protocols used: Information Only Call - No Triage-A-AH

## 2024-11-02 NOTE — Telephone Encounter (Signed)
  Copied from CRM [id number]. Topic: Clinical - Medication Question >> Nov 02, 2024  4:04 PM Kelsey Ruiz wrote: Reason for CRM: Patients daughter is calling asking if Kelsey Ruiz is going to prescribe her HYDROcodone -acetaminophen  (NORCO) 7.5-325 MG tablet [[id number]]. She forgot what courtney recommended at the visit yesterday. She is requesting for someone to call and follow up.

## 2024-11-03 ENCOUNTER — Ambulatory Visit: Payer: Self-pay | Admitting: Physician Assistant

## 2024-11-03 ENCOUNTER — Telehealth: Payer: Self-pay

## 2024-11-03 LAB — CMP14+EGFR
ALT: 8 IU/L (ref 0–32)
AST: 12 IU/L (ref 0–40)
Albumin: 4.4 g/dL (ref 3.6–4.6)
Alkaline Phosphatase: 50 IU/L (ref 48–129)
BUN/Creatinine Ratio: 14 (ref 12–28)
BUN: 11 mg/dL (ref 10–36)
Bilirubin Total: 0.4 mg/dL (ref 0.0–1.2)
CO2: 24 mmol/L (ref 20–29)
Calcium: 9.1 mg/dL (ref 8.7–10.3)
Chloride: 103 mmol/L (ref 96–106)
Creatinine, Ser: 0.79 mg/dL (ref 0.57–1.00)
Globulin, Total: 1.9 g/dL (ref 1.5–4.5)
Glucose: 84 mg/dL (ref 70–99)
Potassium: 3.9 mmol/L (ref 3.5–5.2)
Sodium: 139 mmol/L (ref 134–144)
Total Protein: 6.3 g/dL (ref 6.0–8.5)
eGFR: 70 mL/min/1.73 (ref >59)

## 2024-11-03 LAB — CBC WITH DIFFERENTIAL/PLATELET
Basophils Absolute: 0.1 x10E3/uL (ref 0.0–0.2)
Basos: 1 %
EOS (ABSOLUTE): 0.1 x10E3/uL (ref 0.0–0.4)
Eos: 1 %
Hematocrit: 40.1 % (ref 34.0–46.6)
Hemoglobin: 13 g/dL (ref 11.1–15.9)
Immature Grans (Abs): 0 x10E3/uL (ref 0.0–0.1)
Immature Granulocytes: 0 %
Lymphocytes Absolute: 3 x10E3/uL (ref 0.7–3.1)
Lymphs: 31 %
MCH: 29.6 pg (ref 26.6–33.0)
MCHC: 32.4 g/dL (ref 31.5–35.7)
MCV: 91 fL (ref 79–97)
Monocytes Absolute: 1 x10E3/uL — ABNORMAL HIGH (ref 0.1–0.9)
Monocytes: 10 %
Neutrophils Absolute: 5.4 x10E3/uL (ref 1.4–7.0)
Neutrophils: 57 %
Platelets: 285 x10E3/uL (ref 150–450)
RBC: 4.39 x10E6/uL (ref 3.77–5.28)
RDW: 13 % (ref 11.7–15.4)
WBC: 9.5 x10E3/uL (ref 3.4–10.8)

## 2024-11-03 LAB — LIPID PANEL
Chol/HDL Ratio: 4.3 ratio (ref 0.0–4.4)
Cholesterol, Total: 267 mg/dL — ABNORMAL HIGH (ref 100–199)
HDL: 62 mg/dL (ref >39)
LDL Chol Calc (NIH): 166 mg/dL — ABNORMAL HIGH (ref 0–99)
Triglycerides: 213 mg/dL — ABNORMAL HIGH (ref 0–149)
VLDL Cholesterol Cal: 39 mg/dL (ref 5–40)

## 2024-11-03 LAB — TSH+FREE T4
Free T4: 1.63 ng/dL (ref 0.82–1.77)
TSH: 2.93 u[IU]/mL (ref 0.450–4.500)

## 2024-11-03 MED ORDER — ATORVASTATIN CALCIUM 80 MG PO TABS: 40.0000 mg | ORAL_TABLET | Freq: Every day | ORAL | 1 refills | Status: AC

## 2024-11-03 NOTE — Telephone Encounter (Signed)
 Copied from CRM [id number]. Topic: Clinical - Medical Advice >> Nov 03, 2024 10:34 AM Nathanel BROCKS wrote: Reason for CRM: pt called and said that she remembers something about being told she is being sent to [place] but can't remember why. Can you please call pt and advise.

## 2024-11-03 NOTE — Telephone Encounter (Signed)
 Called patient and they verbalized they understood and will talk to daughter.

## 2024-11-08 NOTE — Telephone Encounter (Signed)
 Called patient and she verbalized she understood. She mentioned not being able to go to [place] for pain management. She knows about the refills(1/2) and to call back. She mentioned if there is another medication to get her on beside what she is on, she will consider that, but stated she has to be on something for pain. Her options are open she verbalized

## 2024-11-08 NOTE — Telephone Encounter (Unsigned)
 Copied from CRM #[id number]. Topic: Clinical - Medical Advice >> Nov 08, 2024 10:20 AM Shanda MATSU wrote: Reason for CRM: Patient called back req that another alternative be recommended for her as she is not going to go to [facility] for pain management, patient stated she feels there is something else that provider can prescribe to her for her pain, is req a call back in regards to this.

## 2024-11-14 ENCOUNTER — Encounter: Payer: Self-pay | Admitting: Family Medicine

## 2024-11-14 ENCOUNTER — Ambulatory Visit: Admitting: Family Medicine

## 2024-11-14 VITALS — BP 133/77 | HR 75 | Temp 97.3°F | Ht 62.0 in | Wt 134.0 lb

## 2024-11-14 DIAGNOSIS — R911 Solitary pulmonary nodule: Secondary | ICD-10-CM

## 2024-11-14 DIAGNOSIS — R634 Abnormal weight loss: Secondary | ICD-10-CM

## 2024-11-14 DIAGNOSIS — B9689 Other specified bacterial agents as the cause of diseases classified elsewhere: Secondary | ICD-10-CM

## 2024-11-14 MED ORDER — HYDROXYZINE PAMOATE 25 MG PO CAPS: ORAL_CAPSULE | ORAL | 5 refills | Status: AC

## 2024-11-14 MED ORDER — AZITHROMYCIN 250 MG PO TABS: ORAL_TABLET | ORAL | 0 refills | Status: AC

## 2024-11-14 MED ORDER — AMOXICILLIN-POT CLAVULANATE 875-125 MG PO TABS: 1.0000 | ORAL_TABLET | Freq: Two times a day (BID) | ORAL | 0 refills | Status: DC

## 2024-11-14 NOTE — Progress Notes (Signed)
   Subjective:    Patient ID: Kelsey  B Ruiz, female    DOB: December 12, 1929, 89 y.o.   MRN: [id number]  HPI  Prod cough, since Saturday, muscle aches, sore throat, laryngitis Asking for refill of her pain medication Norco 7.5-325 mg  Presents today with chest congestion coughing not feeling good symptoms over the past 5 days bringing up discolored phlegm Patient with history of pneumonia and pulmonary embolism not feeling good past few days somewhat better today having some chills but not today no high fever  Was in the hospital 2 years ago and it showed significant pulmonary nodule with recommendation to see pulmonary patient states she did see her family doctor after that hospitalization but states she never had a follow-up scan or never saw pulmonologist recently she has lost 6 pounds but denies hemoptysis   Review of Systems     Objective:   Physical Exam  General-in no acute distress Eyes-no discharge Lungs-respiratory rate normal, CTA CV-no murmurs,RRR Extremities skin warm dry no edema Neuro grossly normal Behavior normal, alert No pneumonia on exam      Assessment & Plan:   Acute bronchitis bacterial I doubt she has COVID or an flu Hold off on x-ray lab work currently Antibiotics discussed Patient open to taking double antibiotic coverage Side effects discussed   Pulmonary nodule with possibility of early cancer at the minimum patient needs CT scan of chest I have discussed this case with her primary care clinician  Patient relates bilateral shoulder pain back pain states she has been on pain medication for a number years As for the pain medicine I will defer this to Darwin Grooms PA  They will help guide whether or not to try different pain medicine or set her up with [facility]  Patient relates recently she has been using the hydroxyzine at nighttime helps her with her anxiety has sleep refills were given

## 2024-11-20 ENCOUNTER — Telehealth: Payer: Self-pay | Admitting: Physician Assistant

## 2024-11-20 ENCOUNTER — Ambulatory Visit: Payer: Self-pay

## 2024-11-20 ENCOUNTER — Other Ambulatory Visit: Payer: Self-pay | Admitting: Physician Assistant

## 2024-11-20 ENCOUNTER — Telehealth: Payer: Self-pay | Admitting: *Deleted

## 2024-11-20 MED ORDER — HYDROCODONE-ACETAMINOPHEN 7.5-325 MG PO TABS: 1.0000 | ORAL_TABLET | Freq: Four times a day (QID) | ORAL | 0 refills | Status: DC

## 2024-11-20 NOTE — Telephone Encounter (Signed)
  FYI Only or Action Required?: Action required by provider: clinical question for provider and update on patient condition. Requesting refill of hydrocodone , refill request sent Patient was last seen in primary care on 11/14/2024 by Alphonsa Glendia LABOR, MD.  Called Nurse Triage reporting Arthritis.  Symptoms began several days ago. (Flare up)  Interventions attempted: Prescription medications: norco, 7.5 running out.  Symptoms are: gradually worsening.  Triage Disposition: See PCP When Office is Open (Within 3 Days)  Patient/caregiver understands and will follow disposition?: No, wishes to speak with PCP Copied from CRM #[id number]. Topic: Clinical - Red Word Triage >> Nov 20, 2024 12:27 PM Rosaria BRAVO wrote: Red Word that prompted transfer to Nurse Triage: [name] (pt's daughter) called to report that the patient is in severe pain. Arthritis pain/Cold weather. Almost out of pain medicine, very concerned. Reason for Disposition  [1] MODERATE pain (e.g., interferes with normal activities) AND [2] present > 3 days  Answer Assessment - Initial Assessment Questions 1. ONSET: When did the pain start?     History of arthritis that has worsened with the cold weather 2. LOCATION: Where is the pain located?     Bilateral shoulder and arm pain 3. PAIN: How bad is the pain? (Scale 1-10; or mild, moderate, severe)     Crying with pain, severe 4. WORK OR EXERCISE: Has there been any recent work or exercise that involved this part of the body?     N/a 5. CAUSE: What do you think is causing the shoulder pain?     arthritis  Protocols used: Shoulder Pain-A-AH

## 2024-11-20 NOTE — Telephone Encounter (Signed)
 Patient with increased arthritis pain.  Down to six tabs, requesting refill

## 2024-11-20 NOTE — Telephone Encounter (Signed)
 Copied from CRM #[id number]. Topic: General - Other >> Nov 16, 2024  2:20 PM Antwanette L wrote: Reason for CRM: [name], the patient daughter  is notifying the office that the patient will not be completing the MRI/CT scan/imaging that was ordered.The imaging referral was placed by Dr. Alphonsa on 11/14/24. [name] can be contacted at [phone]

## 2024-11-20 NOTE — Telephone Encounter (Signed)
 Copied from CRM #[id number]. Topic: Clinical - Medication Refill >> Nov 20, 2024 12:25 PM Rosaria E wrote: Medication: HYDROcodone -acetaminophen  (NORCO) 7.5-325 MG tablet   Has the patient contacted their pharmacy? Yes (Agent: If no, request that the patient contact the pharmacy for the refill. If patient does not wish to contact the pharmacy document the reason why and proceed with request.) (Agent: If yes, when and what did the pharmacy advise?)  This is the patient's preferred pharmacy:  [facility] [place], [place] - [id number] [place] [address] Phone: [phone] Fax: [phone]  Is this the correct pharmacy for this prescription? Yes If no, delete pharmacy and type the correct one.   Has the prescription been filled recently? Yes  Is the patient out of the medication? Yes  Has the patient been seen for an appointment in the last year OR does the patient have an upcoming appointment? Yes  Can we respond through MyChart? Yes  Agent: Please be advised that Rx refills may take up to 3 business days. We ask that you follow-up with your pharmacy.

## 2024-11-23 ENCOUNTER — Ambulatory Visit (HOSPITAL_COMMUNITY)

## 2024-12-05 ENCOUNTER — Telehealth: Payer: Self-pay | Admitting: *Deleted

## 2024-12-05 NOTE — Telephone Encounter (Signed)
 Copied from CRM [id number]. Topic: Clinical - Prescription Issue >> Dec 05, 2024 10:38 AM Treva T wrote: Reason for CRM: Received call from Robin at [facility], calling in regards to medication that needs to be e-scribed, instead of faxed for pt.  Medication:  HYDROcodone -acetaminophen  (NORCO) 7.5-325 MG tablet  Per Grayce, needs a new prescription e-scribed, as cannot accept this medication faxed prescription sent by office.  States will discard faxed prescription, and wait on e-scribed prescription.  If need to speak further of for follow up, can be reached at [phone]   [facility] [place], [place] - [address] [place] [address] Phone: [phone] Fax: [phone]

## 2024-12-06 ENCOUNTER — Other Ambulatory Visit: Payer: Self-pay | Admitting: Physician Assistant

## 2024-12-06 MED ORDER — HYDROCODONE-ACETAMINOPHEN 7.5-325 MG PO TABS: 1.0000 | ORAL_TABLET | Freq: Four times a day (QID) | ORAL | 0 refills | Status: DC

## 2024-12-06 NOTE — Telephone Encounter (Signed)
 Copied from CRM #[id number]. Topic: Clinical - Prescription Issue >> Dec 06, 2024  9:17 AM Wess RAMAN wrote: Reason for CRM: Patient states she will be out of HYDROcodone -acetaminophen  (NORCO) 7.5-325 MG tablet. Pharmacy only gave her 30 tablets. She has to take 1 every 6 hours  Callback #: [phone]  Pharmacy: [facility] [place], [place] - [id number] [place] [address] Phone: [phone] Fax: [phone] Hours: Not open 24 hours

## 2024-12-13 ENCOUNTER — Encounter: Payer: Self-pay | Admitting: Physician Assistant

## 2024-12-13 ENCOUNTER — Ambulatory Visit (INDEPENDENT_AMBULATORY_CARE_PROVIDER_SITE_OTHER): Admitting: Physician Assistant

## 2024-12-13 ENCOUNTER — Telehealth: Payer: Self-pay

## 2024-12-13 VITALS — BP 142/80 | HR 74 | Temp 97.7°F | Ht 62.0 in | Wt 135.0 lb

## 2024-12-13 DIAGNOSIS — F419 Anxiety disorder, unspecified: Secondary | ICD-10-CM | POA: Diagnosis not present

## 2024-12-13 DIAGNOSIS — G894 Chronic pain syndrome: Secondary | ICD-10-CM

## 2024-12-13 MED ORDER — TRAMADOL HCL 50 MG PO TABS: 50.0000 mg | ORAL_TABLET | Freq: Two times a day (BID) | ORAL | 0 refills | Status: DC

## 2024-12-13 NOTE — Telephone Encounter (Signed)
 Copied from CRM #[id number]. Topic: General - Other >> Dec 12, 2024  1:54 PM Wess RAMAN wrote: Reason for CRM: [employer] stated patient is concerned about the increased risk of taking cyclobenzaprine and hydrOXYzine  causing dementia  [phone] ext [phone]

## 2024-12-13 NOTE — Assessment & Plan Note (Signed)
 Pain primarily in right shoulder, knees, fingers, and back. Transition from hydrocodone -acetaminophen  to tramadol due to long-term management limitations. Patient wishes to avoid going to a pain management center. Discussed with patient and daughter that our office is moving away from chronic narcotic pain management.  - Transitioned to tramadol twice daily. - Follow-up in one month to assess tramadol efficacy and confirm long-term management plan.

## 2024-12-13 NOTE — Telephone Encounter (Signed)
 Patient seen in office by provider 12/13/24

## 2024-12-13 NOTE — Progress Notes (Signed)
 "  Acute Office Visit  Subjective:     Patient ID: Kelsey Ruiz, female    DOB: February 15, 1929, 89 y.o.   MRN: [id number]   Discussed the use of AI scribe software for clinical note transcription with the patient, who gave verbal consent to proceed.  History of Present Illness Kelsey  B Ruiz is a 89 year old female with arthritis who presents with chronic pain management concerns. She is accompanied by her daughter, [name].  She has chronic arthritis pain mainly in her right shoulder, knees, back, and occasionally fingers. Right shoulder pain is worse and limits daily activities like getting out of bed and showering without pain medication.  She takes Norco twice daily but only receives 30 tablets per month, which she feels does not cover her pain needs. She voices frustration with limited access to pain medication.    She previously had steroid hip injections from her prior doctor that provided good pain relief.  She also uses gabapentin , hydroxyzine , and Flexeril as needed at bedtime for severe pain and anxiety. She feels they reduce her pain and anxiety without excessive sedation.     Review of Systems  Constitutional:  Negative for activity change, appetite change, fatigue and fever.  Respiratory:  Negative for cough and shortness of breath.   Musculoskeletal:  Positive for arthralgias, back pain and myalgias. Negative for joint swelling.  Neurological:  Negative for dizziness, syncope, weakness and light-headedness.         Objective:     BP (!) 142/80   Pulse 74   Temp 97.7 F (36.5 C)   Ht 5' 2 (1.575 m)   Wt 135 lb (61.2 kg)   SpO2 100%   BMI 24.69 kg/m   Physical Exam Constitutional:      General: She is not in acute distress.    Appearance: Normal appearance. She is normal weight. She is not ill-appearing.  HENT:     Head: Normocephalic and atraumatic.     Mouth/Throat:     Mouth: Mucous membranes are moist.     Pharynx: Oropharynx is clear.  Eyes:      Extraocular Movements: Extraocular movements intact.     Conjunctiva/sclera: Conjunctivae normal.  Cardiovascular:     Rate and Rhythm: Normal rate and regular rhythm.     Heart sounds: Normal heart sounds. No murmur heard. Pulmonary:     Effort: Pulmonary effort is normal.     Breath sounds: Normal breath sounds. No wheezing, rhonchi or rales.  Musculoskeletal:     Right shoulder: Tenderness present. No swelling or deformity. Decreased range of motion.  Skin:    General: Skin is warm and dry.  Neurological:     General: No focal deficit present.     Mental Status: She is alert and oriented to person, place, and time.  Psychiatric:        Mood and Affect: Mood normal.        Behavior: Behavior normal.     No results found for any visits on 12/13/24.      Assessment & Plan:  Chronic pain syndrome Assessment & Plan: Pain primarily in right shoulder, knees, fingers, and back. Transition from hydrocodone -acetaminophen  to tramadol due to long-term management limitations. Patient wishes to avoid going to a pain management center. Discussed with patient and daughter that our office is moving away from chronic narcotic pain management.  - Transitioned to tramadol twice daily. - Follow-up in one month to assess tramadol efficacy and confirm long-term management  plan.  Orders: -     traMADol HCl; Take 1 tablet (50 mg total) by mouth 2 (two) times daily.  Dispense: 60 tablet; Refill: 0  Anxiety Assessment & Plan: Hydroxyzine  effective for nighttime anxiety and sleep. - Refilled hydroxyzine  prescription for nighttime use as needed.     Return in about 4 weeks (around 01/10/2025) for Pain.  Tawfiq Favila, PA-C  "

## 2024-12-13 NOTE — Assessment & Plan Note (Signed)
 Hydroxyzine  effective for nighttime anxiety and sleep. - Refilled hydroxyzine  prescription for nighttime use as needed.

## 2024-12-20 ENCOUNTER — Ambulatory Visit: Payer: Self-pay

## 2024-12-20 ENCOUNTER — Telehealth: Payer: Self-pay

## 2024-12-20 NOTE — Telephone Encounter (Signed)
 Copied from CRM [id number]. Topic: Clinical - Medication Question >> Dec 20, 2024 10:17 AM Alfonso HERO wrote: Reason for CRM: patient called stating her new pain meds aren't working and asking for something else to be sent to pharmacy.

## 2024-12-20 NOTE — Telephone Encounter (Signed)
 PT has appointment on Monday for pain management

## 2024-12-20 NOTE — Telephone Encounter (Signed)
 Attempted to contact pt but NA. Unable to LM as mailbox was full. Will route to clinic at this time.    Copied from CRM [id number]. Topic: Clinical - Medication Question >> Dec 20, 2024 10:17 AM Kelsey Ruiz wrote: Reason for CRM: patient called stating her new pain meds aren't working and asking for something else to be sent to pharmacy.

## 2024-12-20 NOTE — Telephone Encounter (Signed)
 PT has an appt Jan. 12th at 1:40 pm for pain mgmt

## 2024-12-20 NOTE — Telephone Encounter (Signed)
 First attempt to contact pt, no answer, unable to LVM due to mailbox being full. Placed in call back.   Copied from CRM [id number]. Topic: Clinical - Medication Question >> Dec 20, 2024 10:17 AM Alfonso HERO wrote: Reason for CRM: patient called stating her new pain meds aren't working and asking for something else to be sent to pharmacy.

## 2024-12-21 NOTE — Telephone Encounter (Signed)
 PT has appointment 1/12 for pain management and medications

## 2024-12-25 ENCOUNTER — Ambulatory Visit: Admitting: Physician Assistant

## 2024-12-26 ENCOUNTER — Telehealth: Payer: Self-pay

## 2024-12-26 ENCOUNTER — Ambulatory Visit: Admitting: Physician Assistant

## 2024-12-26 NOTE — Telephone Encounter (Signed)
 Prescription Request  12/26/2024  LOV: Visit date not found  What is the name of the medication or equipment?    Have you contacted your pharmacy to request a refill? Yes   Which pharmacy would you like this sent to?  [facility] [place], [place] - [id number] [place] [address] Phone: [phone] Fax: [phone]    Patient notified that their request is being sent to the clinical staff for review and that they should receive a response within 2 business days.   Please advise at Mobile [phone] (mobile)

## 2024-12-29 ENCOUNTER — Other Ambulatory Visit: Payer: Self-pay | Admitting: Physician Assistant

## 2024-12-29 DIAGNOSIS — G894 Chronic pain syndrome: Secondary | ICD-10-CM

## 2025-01-01 ENCOUNTER — Encounter: Payer: Self-pay | Admitting: Physician Assistant

## 2025-01-01 ENCOUNTER — Ambulatory Visit: Admitting: Physician Assistant

## 2025-01-01 VITALS — BP 168/118 | HR 119 | Temp 98.1°F | Ht 62.0 in | Wt 131.8 lb

## 2025-01-01 DIAGNOSIS — I10 Essential (primary) hypertension: Secondary | ICD-10-CM

## 2025-01-01 DIAGNOSIS — G894 Chronic pain syndrome: Secondary | ICD-10-CM

## 2025-01-01 MED ORDER — HYDROCODONE-ACETAMINOPHEN 7.5-325 MG PO TABS: ORAL_TABLET | ORAL | 0 refills | Status: DC

## 2025-01-01 NOTE — Assessment & Plan Note (Signed)
 14/87, 831881 Uncontrolled. Continue current medications. No change in management. Discussed DASH diet and dietary sodium restrictions.  Increase dietary efforts. Follow up in 2 weeks to adjust management or sooner for new chest pain, shortness of breath, headache, or visual changes.

## 2025-01-01 NOTE — Assessment & Plan Note (Signed)
 Chronic pain secondary to osteoarthritis, primarily in the right shoulder and arm, with occasional radiation down the right leg. Pain is severe and persistent, not adequately controlled with tramadol  50 mg twice daily. Concerns about long-term use of narcotic pain medications due to age and potential side effects. She prefers not to travel out of town for pain management but is open to seeing a specialist closer to home. - Prescribed hydrocodone  7.5 mg with Tylenol  twice daily for one month. With stipulation that next refill comes from pain management. Lengthy discussion of risks related to medication and preference with patient to follow with a pain specialist.  - Referred to [facility] for pain management consultation. Provided contact information for [facility] to schedule an appointment.

## 2025-01-01 NOTE — Progress Notes (Signed)
 "  Established Patient Office Visit  Subjective   Patient ID: Kelsey Ruiz  ROCSI HAZELBAKER, female    DOB: 10-26-1929  Age: 89 y.o. MRN: [id number]  Chief Complaint  Patient presents with   Pain Management    PT needs a refill on hydrocodone     Discussed the use of AI scribe software for clinical note transcription with the patient, who gave verbal consent to proceed.  History of Present Illness Kelsey  B Ruiz is a 89 year old female with arthritis who presents with chronic pain management issues.  She experiences diffuse pain, with the most significant discomfort in her right shoulder and arm, sometimes extending down the right leg. The pain is described as severe and persistent, impacting her daily activities.  She has a history of arthritis and has been on chronic narcotic pain medications for many years. At her last visit, after shared decision making, her medication was switched to tramadol  50 mg twice a day. She states this was ineffective even when increased to 100 mg twice a day. She had been taking hydrocodone  with Tylenol  but does not recall the exact dosage or frequency and is currently seeking a refill.  She prefers to stay local for pain management services despite limited options. She has previously referred to [facility] for pain management.   No other concerns or complaints were noted during the visit.    Review of Systems  Constitutional:  Negative for activity change, appetite change, fatigue and fever.  Eyes:  Negative for visual disturbance.  Respiratory:  Negative for cough and shortness of breath.   Cardiovascular:  Negative for chest pain.  Musculoskeletal:  Positive for arthralgias and back pain. Negative for gait problem, joint swelling and myalgias.  Neurological:  Negative for light-headedness and headaches.       Objective:     BP (!) 168/118 (BP Location: Left Arm, Cuff Size: Normal)   Pulse (!) 119   Temp 98.1 F (36.7 C)   Ht 5' 2 (1.575 m)    Wt 131 lb 12.8 oz (59.8 kg)   SpO2 98%   BMI 24.11 kg/m    Physical Exam Constitutional:      General: She is not in acute distress.    Appearance: Normal appearance. She is normal weight. She is not ill-appearing.  HENT:     Head: Normocephalic and atraumatic.     Mouth/Throat:     Mouth: Mucous membranes are moist.     Pharynx: Oropharynx is clear.  Eyes:     Extraocular Movements: Extraocular movements intact.     Conjunctiva/sclera: Conjunctivae normal.  Cardiovascular:     Rate and Rhythm: Normal rate and regular rhythm.     Heart sounds: Normal heart sounds. No murmur heard. Pulmonary:     Effort: Pulmonary effort is normal.     Breath sounds: Normal breath sounds. No wheezing, rhonchi or rales.  Musculoskeletal:     Right lower leg: No edema.     Left lower leg: No edema.  Skin:    General: Skin is warm and dry.  Neurological:     General: No focal deficit present.     Mental Status: She is alert and oriented to person, place, and time.  Psychiatric:        Mood and Affect: Mood normal.        Behavior: Behavior normal.      No results found for any visits on 01/01/25.  The ASCVD Risk score (Arnett DK, et al., 2019) failed  to calculate for the following reasons:   The 2019 ASCVD risk score is only valid for ages 54 to 91   * - Cholesterol units were assumed    Assessment & Plan:   Return in about 2 weeks (around 01/15/2025) for BP.   Chronic pain syndrome Assessment & Plan: Chronic pain secondary to osteoarthritis, primarily in the right shoulder and arm, with occasional radiation down the right leg. Pain is severe and persistent, not adequately controlled with tramadol  50 mg twice daily. Concerns about long-term use of narcotic pain medications due to age and potential side effects. She prefers not to travel out of town for pain management but is open to seeing a specialist closer to home. - Prescribed hydrocodone  7.5 mg with Tylenol  twice daily for one  month. With stipulation that next refill comes from pain management. Lengthy discussion of risks related to medication and preference with patient to follow with a pain specialist.  - Referred to [facility] for pain management consultation. Provided contact information for [facility] to schedule an appointment.  Orders: -     HYDROcodone -Acetaminophen ; Take 1 tablet by mouth every 12 hours as needed for pain  Dispense: 30 tablet; Refill: 0  Essential hypertension Assessment & Plan: 14/87, 831881 Uncontrolled. Continue current medications. No change in management. Discussed DASH diet and dietary sodium restrictions.  Increase dietary efforts. Follow up in 2 weeks to adjust management or sooner for new chest pain, shortness of breath, headache, or visual changes.      Amanat Hackel, PA-C "

## 2025-01-10 ENCOUNTER — Ambulatory Visit: Admitting: Physician Assistant

## 2025-01-11 ENCOUNTER — Ambulatory Visit (INDEPENDENT_AMBULATORY_CARE_PROVIDER_SITE_OTHER)

## 2025-01-11 ENCOUNTER — Telehealth: Payer: Self-pay

## 2025-01-11 VITALS — Ht 62.0 in | Wt 131.0 lb

## 2025-01-11 DIAGNOSIS — Z0000 Encounter for general adult medical examination without abnormal findings: Secondary | ICD-10-CM

## 2025-01-11 NOTE — Progress Notes (Signed)
 "  Chief Complaint  Patient presents with   Medicare Wellness     Subjective:   Kelsey Ruiz is a 89 y.o. female who presents for a Medicare Annual Wellness Visit.  Visit info / Clinical Intake: Medicare Wellness Visit Type:: Subsequent Annual Wellness Visit Persons participating in visit and providing information:: patient Medicare Wellness Visit Mode:: Telephone If telephone:: video error Since this visit was completed virtually, some vitals may be partially provided or unavailable. Missing vitals are due to the limitations of the virtual format.: Documented vitals are patient reported If Telephone or Video please confirm:: I connected with patient using audio/video enable telemedicine. I verified patient identity with two identifiers, discussed telehealth limitations, and patient agreed to proceed. Patient Location:: home Provider Location:: home office Interpreter Needed?: No Pre-visit prep was completed: yes AWV questionnaire completed by patient prior to visit?: no Living arrangements:: with family/others Patient's Overall Health Status Rating: excellent Typical amount of pain: (!) a lot Does pain affect daily life?: (!) yes Are you currently prescribed opioids?: (!) yes  Dietary Habits and Nutritional Risks How many meals a day?: 3 Eats fruit and vegetables daily?: yes Most meals are obtained by: preparing own meals In the last 2 weeks, have you had any of the following?: none Diabetic:: no  Functional Status Activities of Daily Living (to include ambulation/medication): Independent Ambulation: Independent with device- listed below Home Assistive Devices/Equipment: Walker (specify Type) Medication Administration: Independent Home Management (perform basic housework or laundry): Independent Manage your own finances?: yes Primary transportation is: family / friends Concerns about vision?: no *vision screening is required for WTM* Concerns about hearing?:  no  Fall Screening Falls in the past year?: 0 Number of falls in past year: 0 Was there an injury with Fall?: 0 Fall Risk Category Calculator: 0 Patient Fall Risk Level: Low Fall Risk  Fall Risk Patient at Risk for Falls Due to: Impaired balance/gait; Impaired mobility Fall risk Follow up: Falls evaluation completed; Education provided; Falls prevention discussed  Home and Transportation Safety: All rugs have non-skid backing?: yes All stairs or steps have railings?: yes Grab bars in the bathtub or shower?: yes Have non-skid surface in bathtub or shower?: yes Good home lighting?: yes Regular seat belt use?: yes Hospital stays in the last year:: no  Cognitive Assessment Difficulty concentrating, remembering, or making decisions? : no Will 6CIT or Mini Cog be Completed: yes What year is it?: 0 points What month is it?: 0 points Give patient an address phrase to remember (5 components): [address] [place] About what time is it?: 0 points Count backwards from 20 to 1: 0 points Say the months of the year in reverse: 0 points Repeat the address phrase from earlier: 0 points 6 CIT Score: 0 points  Advance Directives (For Healthcare) Does Patient Have a Medical Advance Directive?: No Does patient want to make changes to medical advance directive?: No - Patient declined Type of Advance Directive: Healthcare Power of Attorney Copy of Healthcare Power of Attorney in Chart?: No - copy requested Would patient like information on creating a medical advance directive?: No - Patient declined  Reviewed/Updated  Reviewed/Updated: Reviewed All (Medical, Surgical, Family, Medications, Allergies, Care Teams, Patient Goals)    Allergies (verified) Sulfa  antibiotics   Current Medications (verified) Outpatient Encounter Medications as of 01/11/2025  Medication Sig   apixaban  (ELIQUIS ) 5 MG TABS tablet Take 1 tablet (5 mg total) by mouth 2 (two) times daily.   atorvastatin  (LIPITOR)  80 MG tablet  Take 0.5 tablets (40 mg total) by mouth daily.   Calcium -Magnesium -Zinc  (CAL-MAG-ZINC  PO) Take 1 tablet by mouth every morning.    diltiazem  (CARDIZEM  CD) 180 MG 24 hr capsule Take 360 mg by mouth daily.   gabapentin  (NEURONTIN ) 100 MG capsule Take 100 mg by mouth 2 (two) times daily.   HYDROcodone -acetaminophen  (NORCO) 7.5-325 MG tablet Take 1 tablet by mouth every 12 hours as needed for pain   hydrOXYzine  (VISTARIL ) 25 MG capsule One at bedtime prn   levothyroxine  (SYNTHROID ) 88 MCG tablet Take 88 mcg by mouth daily.   vitamin C  (ASCORBIC ACID ) 500 MG tablet Take 1,000 mg by mouth every morning.    No facility-administered encounter medications on file as of 01/11/2025.    History: Past Medical History:  Diagnosis Date   Arthritis    GERD (gastroesophageal reflux disease)    Glaucoma    Hx of bladder infections    Hyperlipemia    Hypertension    Hypothyroidism    Past Surgical History:  Procedure Laterality Date   ABDOMINAL HYSTERECTOMY     BREAST BIOPSY Right 04/11/2014   Procedure: RIGHT BREAST BIOPSY AFTER NEEDLE LOCALIZATION;  Surgeon: Oneil DELENA Budge, MD;  Location: [facility];  Service: General;  Laterality: Right;  NEEDLE LOC @ 8:00   BREAST LUMPECTOMY Right    BREAST LUMPECTOMY Right    BREAST SURGERY Bilateral    benign cysts   CESAREAN SECTION     laser eyes Bilateral    for glaucoma   THYROIDECTOMY     Family History  Problem Relation Age of Onset   Breast cancer Neg Hx    Social History   Occupational History   Not on file  Tobacco Use   Smoking status: Former    Current packs/day: 0.00    Average packs/day: 0.5 packs/day for 10.0 years (5.0 ttl pk-yrs)    Types: Cigarettes    Start date: 04/05/1974    Quit date: 04/05/1984    Years since quitting: 40.7   Smokeless tobacco: Never  Vaping Use   Vaping status: Never Used  Substance and Sexual Activity   Alcohol use: No   Drug use: No   Sexual activity: Yes    Birth control/protection:  Surgical   Tobacco Counseling Counseling given: Yes  SDOH Screenings   Food Insecurity: No Food Insecurity (01/11/2025)  Housing: Low Risk (01/11/2025)  Transportation Needs: No Transportation Needs (01/11/2025)  Utilities: Not At Risk (01/11/2025)  Depression (PHQ2-9): Low Risk (01/11/2025)  Recent Concern: Depression (PHQ2-9) - Medium Risk (01/01/2025)  Physical Activity: Inactive (01/11/2025)  Social Connections: Socially Isolated (01/11/2025)  Stress: No Stress Concern Present (01/11/2025)  Tobacco Use: Medium Risk (01/11/2025)  Health Literacy: Adequate Health Literacy (01/11/2025)   See flowsheets for full screening details  Depression Screen PHQ 2 & 9 Depression Scale- Over the past 2 weeks, how often have you been bothered by any of the following problems? Little interest or pleasure in doing things: 0 Feeling down, depressed, or hopeless (PHQ Adolescent also includes...irritable): 0 PHQ-2 Total Score: 0 Trouble falling or staying asleep, or sleeping too much: 3 Feeling tired or having little energy: 0 Poor appetite or overeating (PHQ Adolescent also includes...weight loss): 0 Feeling bad about yourself - or that you are a failure or have let yourself or your family down: 0 Trouble concentrating on things, such as reading the newspaper or watching television (PHQ Adolescent also includes...like school work): 0 Moving or speaking so slowly that other people could have noticed.  Or the opposite - being so fidgety or restless that you have been moving around a lot more than usual: 0 Thoughts that you would be better off dead, or of hurting yourself in some way: 0 PHQ-9 Total Score: 3 If you checked off any problems, how difficult have these problems made it for you to do your work, take care of things at home, or get along with other people?: Very difficult  Depression Treatment Depression Interventions/Treatment : EYV7-0 Score <4 Follow-up Not Indicated     Goals Addressed              This Visit's Progress    stay as healthy and independent as possible               Objective:    Today's Vitals   01/11/25 1305  Weight: 131 lb (59.4 kg)  Height: 5' 2 (1.575 m)   Body mass index is 23.96 kg/m.  Hearing/Vision screen Hearing Screening - Comments:: Patient denies any hearing difficulties.   Vision Screening - Comments:: Patient is not up to date on yearly eye exams. She will call for an appt if she has any issues. Currently not having any vision concerns Immunizations and Health Maintenance Health Maintenance  Topic Date Due   DTaP/Tdap/Td (1 - Tdap) Never done   Pneumococcal Vaccine: 50+ Years (1 of 2 - PCV) Never done   Zoster Vaccines- Shingrix (1 of 2) Never done   Medicare Annual Wellness (AWV)  02/23/2024   COVID-19 Vaccine (1 - 2025-26 season) Never done   Influenza Vaccine  Completed   Bone Density Scan  Completed   Meningococcal B Vaccine  Aged Out        Assessment/Plan:  This is a routine wellness examination for Kelsey Ruiz .  Patient Care Team: Grooms, Charmaine, [place] as PCP - General (Physician Assistant) [place] [facility], [place] as Consulting Physician (Cardiology) [facility], [facility]  (Optometry) [place], Montie, DDS (Dental General Practice)  I have personally reviewed and noted the following in the patients chart:   Medical and social history Use of alcohol, tobacco or illicit drugs  Current medications and supplements including opioid prescriptions. Functional ability and status Nutritional status Physical activity Advanced directives List of other physicians Hospitalizations, surgeries, and ER visits in previous 12 months Vitals Screenings to include cognitive, depression, and falls Referrals and appointments  No orders of the defined types were placed in this encounter.  In addition, I have reviewed and discussed with patient certain preventive protocols, quality metrics, and best practice  recommendations. A written personalized care plan for preventive services as well as general preventive health recommendations were provided to patient.   Brevyn Ring, CMA   01/11/2025   Return January 17, 2026 at 9:20 am, for In office Medicare Well Visit w  Wellness Nurse.  After Visit Summary: (Mail) Due to this being a telephonic visit, the after visit summary with patients personalized plan was offered to patient via mail    "

## 2025-01-11 NOTE — Patient Instructions (Signed)
 Kelsey Ruiz,  Thank you for taking the time for your Medicare Wellness Visit. I appreciate your continued commitment to your health goals. Please review the care plan we discussed, and feel free to reach out if I can assist you further.  Please note that Annual Wellness Visits do not include a physical exam. Some assessments may be limited, especially if the visit was conducted virtually. If needed, we may recommend an in-person follow-up with your provider.  Ongoing Care Seeing your primary care provider every 3 to 6 months helps us  monitor your health and provide consistent, personalized care.   Referrals If a referral was made during today's visit and you haven't received any updates within two weeks, please contact the referred provider directly to check on the status.  Recommended Screenings:  Health Maintenance  Topic Date Due   DTaP/Tdap/Td vaccine (1 - Tdap) Never done   Pneumococcal Vaccine for age over 60 (1 of 2 - PCV) Never done   Zoster (Shingles) Vaccine (1 of 2) Never done   Medicare Annual Wellness Visit  02/23/2024   COVID-19 Vaccine (1 - 2025-26 season) Never done   Flu Shot  Completed   Osteoporosis screening with Bone Density Scan  Completed   Meningitis B Vaccine  Aged Out       01/11/2025    1:08 PM  Advanced Directives  Does Patient Have a Medical Advance Directive? No  Would patient like information on creating a medical advance directive? No - Patient declined    Vision: Annual vision screenings are recommended for early detection of glaucoma, cataracts, and diabetic retinopathy. These exams can also reveal signs of chronic conditions such as diabetes and high blood pressure.  Dental: Annual dental screenings help detect early signs of oral cancer, gum disease, and other conditions linked to overall health, including heart disease and diabetes.  Please see the attached documents for additional preventive care recommendations.

## 2025-01-11 NOTE — Telephone Encounter (Signed)
 Copied from CRM #[id number]. Topic: Clinical - Medication Refill >> Jan 11, 2025 12:07 PM Amber H wrote: Medication: HYDROcodone -acetaminophen  (NORCO) 7.5-325 MG tablet- patient wants to know if she could get on a higher dosage or be prescribed more pills due to medication not really helping. Stated taking 1 pill every 12 hours is not helping  Has the patient contacted their pharmacy? No (Agent: If no, request that the patient contact the pharmacy for the refill. If patient does not wish to contact the pharmacy document the reason why and proceed with request.) (Agent: If yes, when and what did the pharmacy advise?)  This is the patient's preferred pharmacy:  [facility] [place], [place] - [id number] [place] [address] Phone: [phone] Fax: [phone]  Is this the correct pharmacy for this prescription? Yes If no, delete pharmacy and type the correct one.   Has the prescription been filled recently? Yes  Is the patient out of the medication? No will be out Sunday  Has the patient been seen for an appointment in the last year OR does the patient have an upcoming appointment? Yes- 12/13/2024  Can we respond through MyChart? No  Agent: Please be advised that Rx refills may take up to 3 business days. We ask that you follow-up with your pharmacy.

## 2025-01-12 ENCOUNTER — Other Ambulatory Visit: Payer: Self-pay | Admitting: Physician Assistant

## 2025-01-12 DIAGNOSIS — G894 Chronic pain syndrome: Secondary | ICD-10-CM

## 2025-01-12 MED ORDER — HYDROCODONE-ACETAMINOPHEN 7.5-325 MG PO TABS: ORAL_TABLET | ORAL | 0 refills | Status: AC

## 2025-01-12 NOTE — Telephone Encounter (Signed)
 Unable to leave pt voicemail.

## 2025-01-15 ENCOUNTER — Ambulatory Visit: Admitting: Physician Assistant

## 2025-05-01 ENCOUNTER — Ambulatory Visit: Admitting: Physician Assistant

## 2026-01-17 ENCOUNTER — Ambulatory Visit
